# Patient Record
Sex: Female | Born: 1945 | Race: Black or African American | Hispanic: No | Smoking: Former smoker
Health system: Southern US, Community
[De-identification: ages and names within clinical notes are randomized; demographics above are authoritative.]

## PROBLEM LIST (undated history)

## (undated) DIAGNOSIS — Z95828 Presence of other vascular implants and grafts: Secondary | ICD-10-CM

## (undated) DIAGNOSIS — D689 Coagulation defect, unspecified: Secondary | ICD-10-CM

## (undated) DIAGNOSIS — T7840XA Allergy, unspecified, initial encounter: Secondary | ICD-10-CM

## (undated) DIAGNOSIS — I82409 Acute embolism and thrombosis of unspecified deep veins of unspecified lower extremity: Secondary | ICD-10-CM

## (undated) DIAGNOSIS — D259 Leiomyoma of uterus, unspecified: Secondary | ICD-10-CM

## (undated) DIAGNOSIS — H40059 Ocular hypertension, unspecified eye: Secondary | ICD-10-CM

## (undated) DIAGNOSIS — K219 Gastro-esophageal reflux disease without esophagitis: Secondary | ICD-10-CM

## (undated) DIAGNOSIS — H269 Unspecified cataract: Secondary | ICD-10-CM

## (undated) DIAGNOSIS — M5136 Other intervertebral disc degeneration, lumbar region: Secondary | ICD-10-CM

## (undated) DIAGNOSIS — I10 Essential (primary) hypertension: Secondary | ICD-10-CM

## (undated) DIAGNOSIS — M51369 Other intervertebral disc degeneration, lumbar region without mention of lumbar back pain or lower extremity pain: Secondary | ICD-10-CM

## (undated) DIAGNOSIS — E785 Hyperlipidemia, unspecified: Secondary | ICD-10-CM

## (undated) DIAGNOSIS — R87629 Unspecified abnormal cytological findings in specimens from vagina: Secondary | ICD-10-CM

## (undated) DIAGNOSIS — F32A Depression, unspecified: Secondary | ICD-10-CM

## (undated) DIAGNOSIS — F329 Major depressive disorder, single episode, unspecified: Secondary | ICD-10-CM

## (undated) HISTORY — DX: Major depressive disorder, single episode, unspecified: F32.9

## (undated) HISTORY — DX: Depression, unspecified: F32.A

## (undated) HISTORY — PX: COLPOSCOPY: SHX161

## (undated) HISTORY — DX: Hyperlipidemia, unspecified: E78.5

## (undated) HISTORY — DX: Gastro-esophageal reflux disease without esophagitis: K21.9

## (undated) HISTORY — DX: Acute embolism and thrombosis of unspecified deep veins of unspecified lower extremity: I82.409

## (undated) HISTORY — DX: Leiomyoma of uterus, unspecified: D25.9

## (undated) HISTORY — DX: Unspecified abnormal cytological findings in specimens from vagina: R87.629

## (undated) HISTORY — DX: Unspecified cataract: H26.9

## (undated) HISTORY — DX: Presence of other vascular implants and grafts: Z95.828

## (undated) HISTORY — DX: Allergy, unspecified, initial encounter: T78.40XA

## (undated) HISTORY — DX: Coagulation defect, unspecified: D68.9

## (undated) HISTORY — PX: CERVICAL BIOPSY: SHX590

## (undated) HISTORY — DX: Essential (primary) hypertension: I10

## (undated) HISTORY — DX: Ocular hypertension, unspecified eye: H40.059

---

## 2011-01-17 HISTORY — PX: INSERTION OF VENA CAVA FILTER: SHX5871

## 2012-09-21 ENCOUNTER — Other Ambulatory Visit: Payer: Self-pay | Admitting: *Deleted

## 2012-09-21 DIAGNOSIS — Z780 Asymptomatic menopausal state: Secondary | ICD-10-CM

## 2012-10-07 ENCOUNTER — Telehealth: Payer: Self-pay | Admitting: Family Medicine

## 2012-10-07 NOTE — Telephone Encounter (Signed)
APPT RESCHEDULED

## 2012-10-10 ENCOUNTER — Ambulatory Visit: Payer: Self-pay | Admitting: Family Medicine

## 2012-10-17 ENCOUNTER — Ambulatory Visit (INDEPENDENT_AMBULATORY_CARE_PROVIDER_SITE_OTHER): Payer: Medicare Other | Admitting: Family Medicine

## 2012-10-17 ENCOUNTER — Encounter: Payer: Self-pay | Admitting: Family Medicine

## 2012-10-17 VITALS — BP 138/79 | HR 96 | Temp 99.1°F | Ht 67.0 in | Wt 206.2 lb

## 2012-10-17 DIAGNOSIS — E785 Hyperlipidemia, unspecified: Secondary | ICD-10-CM

## 2012-10-17 DIAGNOSIS — I10 Essential (primary) hypertension: Secondary | ICD-10-CM | POA: Insufficient documentation

## 2012-10-17 NOTE — Progress Notes (Signed)
Patient ID: Renee Pratt, female   DOB: 10-29-1945, 67 y.o.   MRN: [id number] SUBJECTIVE:   HPI: Patient is here for follow up of hyperlipidemia: deniesHeadache;deniesChest Pain;deniesweakness;deniesShortness of Breath and orthopnea;deniesVisual changes;deniespalpitations;deniescough;deniespedal edema;deniessymptoms of TIA or stroke;deniesClaudication symptoms. admits toCompliance with medications; deniesProblems with medications.   PMH/PSH: reviewed/updated in Epic  SH/FH: reviewed/updated in Epic  Allergies: reviewed/updated in Epic  Medications: reviewed/updated in Epic  Immunizations: reviewed/updated in Epic  ROS: As above in the HPI. All other systems are stable or negative.  OBJECTIVE:    On examination she appeared in good health and spirits. Vital signs as documented. BP 138/79  Pulse 96  Temp(Src) 99.1 F (37.3 C) (Oral)  Ht 5\' 7"  (1.702 m)  Wt 206 lb 3.2 oz (93.532 kg)  BMI 32.29 kg/m2  SpO2 99% Obese Skin warm and dry and without overt rashes.  Head, Eyes, Ears, throat: normal Neck without JVD.  Lungs clear.  Heart exam notable for regular rhythm, normal sounds and absence of murmurs, rubs or gallops. Abdomen unremarkable and without evidence of organomegaly, masses, or abdominal aortic enlargement.  Extremities nonedematous. Neurologic: nonfocal  ASSESSMENT:  Unspecified essential hypertension  Other and unspecified hyperlipidemia  Obesity  PLAN:  No orders of the defined types were placed in this encounter.   No results found for this or any previous visit (from the past 24 hour(s)). Meds ordered this encounter  Medications  . atorvastatin (LIPITOR) 20 MG tablet    Sig:   . hydrochlorothiazide (HYDRODIURIL) 25 MG tablet    Sig:   . losartan (COZAAR) 100 MG tablet    Sig:   . NIFEdipine (PROCARDIA XL/ADALAT-CC) 60 MG 24 hr tablet    Sig:    reviewed meds. Refill is appropriate. Blood pressure is much better. Diet and exercise discussed.  Return to clinic in 2 months. for fasting labs.  Renee Pratt, M.D.

## 2012-12-18 ENCOUNTER — Ambulatory Visit (INDEPENDENT_AMBULATORY_CARE_PROVIDER_SITE_OTHER): Payer: Medicare Other | Admitting: Pharmacist

## 2012-12-18 ENCOUNTER — Ambulatory Visit (INDEPENDENT_AMBULATORY_CARE_PROVIDER_SITE_OTHER): Payer: Medicare Other

## 2012-12-18 VITALS — Ht 66.0 in | Wt 204.0 lb

## 2012-12-18 DIAGNOSIS — Z780 Asymptomatic menopausal state: Secondary | ICD-10-CM

## 2012-12-18 DIAGNOSIS — Z13820 Encounter for screening for osteoporosis: Secondary | ICD-10-CM

## 2012-12-18 NOTE — Progress Notes (Signed)
Patient ID: Renee Pratt, female   DOB: 03/08/1946, 67 y.o.   MRN: [id number] [facility] Current Height: Height: 5\' 6"  (167.6 cm)      Max Lifetime Height:  5\' 6"  Current Weight: Weight: 204 lb (92.534 kg)       Ethnicity:African American    HPI: Does pt already have a diagnosis of:  Osteopenia?  No Osteoporosis?  No  Back Pain?  No       Kyphosis?  No Prior fracture?  No Med(s) for Osteoporosis/Osteopenia:  none Med(s) previously tried for Osteoporosis/Osteopenia:  none                                                             PMH: Age at menopause:  59's Hysterectomy?  No Oophorectomy?  No HRT? No Steroid Use?  No Thyroid med?  No History of cancer?  No History of digestive disorders (ie Crohn's)?  No Current or previous eating disorders?  No Last Vitamin D Result:  needed Last GFR Result:  needed   FH/SH: Family history of osteoporosis?  No Parent with history of hip fracture?  No Family history of breast cancer?  No Exercise?  Yes - silver sneakers and zumba (4 times weekly) Smoking?  No Alcohol?  No    Calcium Assessment Calcium Intake  # of servings/day  Calcium mg  Milk (8 oz) 0  x  300  = 0  Yogurt (4 oz) 1 x  200 = 200  Cheese (1 oz) 1 x  200 = 1  Other Calcium sources   250mg   Ca supplement mvi = 400mg    Estimated calcium intake per day 850mg     DEXA Results Date of Test T-Score for AP Spine L1-L4 T-Score for Total Left Hip T-Score for Total Right Hip  06/112/014 2.0 1.8 2.1                    Assessment: Normal BMD   Recommendations: 1.  Recommended increase calcium 1200mg  daily through supplementation or diet.  2.  continue weight bearing exercise - 30 minutes at least 4 days  per week.   3.  Counseled and educated about fall risk and prevention.  Recheck DEXA:  2-3 years  Time spent counseling patient:  15 minutes

## 2012-12-19 ENCOUNTER — Other Ambulatory Visit: Payer: Self-pay | Admitting: Family Medicine

## 2012-12-19 ENCOUNTER — Other Ambulatory Visit (INDEPENDENT_AMBULATORY_CARE_PROVIDER_SITE_OTHER): Payer: Medicare Other

## 2012-12-19 DIAGNOSIS — E785 Hyperlipidemia, unspecified: Secondary | ICD-10-CM

## 2012-12-19 DIAGNOSIS — E559 Vitamin D deficiency, unspecified: Secondary | ICD-10-CM

## 2012-12-19 DIAGNOSIS — I10 Essential (primary) hypertension: Secondary | ICD-10-CM

## 2012-12-19 LAB — COMPLETE METABOLIC PANEL WITH GFR
ALT: 10 U/L (ref 0–35)
AST: 13 U/L (ref 0–37)
Albumin: 4.1 g/dL (ref 3.5–5.2)
Alkaline Phosphatase: 70 U/L (ref 39–117)
BUN: 10 mg/dL (ref 6–23)
CO2: 30 mEq/L (ref 19–32)
Calcium: 9.3 mg/dL (ref 8.4–10.5)
Chloride: 104 mEq/L (ref 96–112)
Creat: 0.85 mg/dL (ref 0.50–1.10)
GFR, Est African American: 83 mL/min
GFR, Est Non African American: 72 mL/min
Glucose, Bld: 95 mg/dL (ref 70–99)
Potassium: 3.7 mEq/L (ref 3.5–5.3)
Sodium: 142 mEq/L (ref 135–145)
Total Bilirubin: 0.4 mg/dL (ref 0.3–1.2)
Total Protein: 7.1 g/dL (ref 6.0–8.3)

## 2012-12-20 ENCOUNTER — Other Ambulatory Visit: Payer: Self-pay | Admitting: Family Medicine

## 2012-12-20 DIAGNOSIS — E785 Hyperlipidemia, unspecified: Secondary | ICD-10-CM

## 2012-12-20 LAB — NMR LIPOPROFILE WITH LIPIDS
Cholesterol, Total: 236 mg/dL — ABNORMAL HIGH (ref <200)
HDL Particle Number: 32.8 umol/L (ref >=30.5)
HDL Size: 8.5 nm — ABNORMAL LOW (ref >=9.2)
HDL-C: 48 mg/dL (ref >=40)
LDL (calc): 166 mg/dL — ABNORMAL HIGH (ref <100)
LDL Particle Number: 2608 nmol/L — ABNORMAL HIGH (ref <1000)
LDL Size: 20.9 nm (ref >20.5)
LP-IR Score: 84 — ABNORMAL HIGH (ref <=45)
Large HDL-P: 3 umol/L — ABNORMAL LOW (ref >=4.8)
Large VLDL-P: 3.2 nmol/L — ABNORMAL HIGH (ref <=2.7)
Small LDL Particle Number: 1437 nmol/L — ABNORMAL HIGH (ref <=527)
Triglycerides: 111 mg/dL (ref <150)
VLDL Size: 59.1 nm — ABNORMAL HIGH (ref <=46.6)

## 2012-12-20 LAB — VITAMIN D 25 HYDROXY (VIT D DEFICIENCY, FRACTURES): Vit D, 25-Hydroxy: 46 ng/mL (ref 30–89)

## 2012-12-20 MED ORDER — ATORVASTATIN CALCIUM 20 MG PO TABS: 20.0000 mg | ORAL_TABLET | Freq: Every day | ORAL | Status: DC

## 2012-12-24 ENCOUNTER — Encounter: Payer: Self-pay | Admitting: Family Medicine

## 2012-12-24 ENCOUNTER — Ambulatory Visit (INDEPENDENT_AMBULATORY_CARE_PROVIDER_SITE_OTHER): Payer: Medicare Other | Admitting: Family Medicine

## 2012-12-24 VITALS — BP 137/81 | HR 84 | Temp 97.8°F | Wt 208.4 lb

## 2012-12-24 DIAGNOSIS — E559 Vitamin D deficiency, unspecified: Secondary | ICD-10-CM | POA: Insufficient documentation

## 2012-12-24 DIAGNOSIS — E669 Obesity, unspecified: Secondary | ICD-10-CM | POA: Insufficient documentation

## 2012-12-24 DIAGNOSIS — R635 Abnormal weight gain: Secondary | ICD-10-CM

## 2012-12-24 DIAGNOSIS — E785 Hyperlipidemia, unspecified: Secondary | ICD-10-CM

## 2012-12-24 DIAGNOSIS — I10 Essential (primary) hypertension: Secondary | ICD-10-CM

## 2012-12-24 NOTE — Patient Instructions (Addendum)
      Dr Phares Zaccone's Recommendations  Diet and Exercise discussed with patient.  For nutrition information, I recommend books:  1).Eat to Live by Dr Joel Fuhrman. 2).Prevent and Reverse Heart Disease by Dr Caldwell Esselstyn. 3) Dr Neal Barnard's Book:  Program to Reverse Diabetes  Exercise recommendations are:  If unable to walk, then the patient can exercise in a chair 3 times a day. By flapping arms like a bird gently and raising legs outwards to the front.  If ambulatory, the patient can go for walks for 30 minutes 3 times a week. Then increase the intensity and duration as tolerated.  Goal is to try to attain exercise frequency to 5 times a week.  If applicable: Best to perform resistance exercises (machines or weights) 2 days a week and cardio type exercises 3 days per week.  

## 2012-12-24 NOTE — Progress Notes (Signed)
Patient ID: Renee Pratt, female   DOB: October 28, 1945, 67 y.o.   MRN: [id number] SUBJECTIVE: CC: Chief Complaint  Patient presents with  . Hypertension    reck bp needs appt   needs refills .was on torsemide does she cont this med as needed    HPI:  Breakfast: 2 toast and coffee Lunch: broccoli, chicken or crackers with tuna. Dinner depends if she eats lunch.  Patient is here for follow up of hyperlipidemia:  denies Headache;denies Chest Pain;denies weakness;denies Shortness of Breath and orthopnea;denies Visual changes;denies palpitations;denies cough;denies pedal edema;denies symptoms of TIA or stroke;deniesClaudication symptoms. admits to Compliance with medications; denies Problems with medications. Patient is here for follow up of hypertension:  denies Headache;deniesChest Pain;denies weakness;denies Shortness of Breath or Orthopnea;denies Visual changes;denies palpitations;denies cough;denies pedal edema;denies symptoms of TIA or stroke; admits to Compliance with medications. denies Problems with medications.  Also has Vit D deficiency   PMH/PSH: reviewed/updated in Epic  SH/FH: reviewed/updated in Epic  Allergies: reviewed/updated in Epic  Medications: reviewed/updated in Epic  Immunizations: reviewed/updated in Epic  ROS: As above in the HPI. All other systems are stable or negative.  OBJECTIVE: APPEARANCE:  Patient in no acute distress.The patient appeared well nourished and normally developed. Acyanotic. Waist: VITAL SIGNS:BP 137/81  Pulse 84  Temp(Src) 97.8 F (36.6 C) (Oral)  Wt 208 lb 6.4 oz (94.53 kg)  BMI 33.65 kg/m2 WF obese  SKIN: warm and  Dry without overt rashes, tattoos and scars  HEAD and Neck: without JVD, Head and scalp: normal Eyes:No scleral icterus. Fundi normal, eye movements normal. Ears: Auricle normal, canal normal, Tympanic membranes normal, insufflation normal. Nose: normal Throat: normal Neck & thyroid: normal  CHEST &  LUNGS: Chest wall: normal Lungs: Clear  CVS: Reveals the PMI to be normally located. Regular rhythm, First and Second Heart sounds are normal,  absence of murmurs, rubs or gallops. Peripheral vasculature: Radial pulses: normal Dorsal pedis pulses: normal Posterior pulses: normal  ABDOMEN:  Appearance: obeseBenign, no organomegaly, no masses, no Abdominal Aortic enlargement. No Guarding , no rebound. No Bruits. Bowel sounds: normal  RECTAL: N/A GU: N/A  EXTREMETIES: nonedematous. Both Femoral and Pedal pulses are normal.  MUSCULOSKELETAL:  Spine: normal Joints: intact  NEUROLOGIC: oriented to time,place and person; nonfocal. Strength is normal Sensory is normal Reflexes are normal Cranial Nerves are normal.  Results for orders placed in visit on 12/19/12  COMPLETE METABOLIC PANEL WITH GFR      Result Value Range   Sodium 142  135 - 145 mEq/L   Potassium 3.7  3.5 - 5.3 mEq/L   Chloride 104  96 - 112 mEq/L   CO2 30  19 - 32 mEq/L   Glucose, Bld 95  70 - 99 mg/dL   BUN 10  6 - 23 mg/dL   Creat 8.29  5.62 - 1.30 mg/dL   Total Bilirubin 0.4  0.3 - 1.2 mg/dL   Alkaline Phosphatase 70  39 - 117 U/L   AST 13  0 - 37 U/L   ALT 10  0 - 35 U/L   Total Protein 7.1  6.0 - 8.3 g/dL   Albumin 4.1  3.5 - 5.2 g/dL   Calcium 9.3  8.4 - 86.5 mg/dL   GFR, Est African American 83     GFR, Est Non African American 72    NMR LIPOPROFILE WITH LIPIDS      Result Value Range   LDL Particle Number [id number] (*) <1000 nmol/L   LDL (calc)  166 (*) <100 mg/dL   HDL-C 48  >=16 mg/dL   Triglycerides 109  <604 mg/dL   Cholesterol, Total 540 (*) <200 mg/dL   HDL Particle Number [id number]  >=19.1 umol/L   Large HDL-P 3.0 (*) >=4.8 umol/L   Large VLDL-P 3.2 (*) <=2.7 nmol/L   Small LDL Particle Number [id number] (*) <=527 nmol/L   LDL Size 20.9  >20.5 nm   HDL Size 8.5 (*) >=9.2 nm   VLDL Size 59.1 (*) <=46.6 nm   LP-IR Score 84 (*) <=45  VITAMIN D 25 HYDROXY      Result Value Range   Vit D,  25-Hydroxy 46  30 - 89 ng/mL    ASSESSMENT: Unspecified essential hypertension  Other and unspecified hyperlipidemia  Unspecified vitamin D deficiency  Obesity, unspecified    PLAN:      Dr Woodroe Mode Recommendations  Diet and Exercise discussed with patient.  For nutrition information, I recommend books:  1).Eat to Live by Dr Monico Hoar. 2).Prevent and Reverse Heart Disease by Dr Suzzette Righter. 3) Dr Katherina Right Book:  Program to Reverse Diabetes  Exercise recommendations are:  If unable to walk, then the patient can exercise in a chair 3 times a day. By flapping arms like a bird gently and raising legs outwards to the front.  If ambulatory, the patient can go for walks for 30 minutes 3 times a week. Then increase the intensity and duration as tolerated.  Goal is to try to attain exercise frequency to 5 times a week.  If applicable: Best to perform resistance exercises (machines or weights) 2 days a week and cardio type exercises 3 days per week.    Reviewed results with the patient. Discussed treatment and adjustment of the lipitor.:  Current outpatient prescriptions:atorvastatin (LIPITOR) 20 MG tablet, Take 1 tablet (20 mg total) by mouth daily., Disp: 30 tablet, Rfl: 3;  cholecalciferol (VITAMIN D) 1000 UNITS tablet, Take 2,000 Units by mouth daily., Disp: , Rfl: ;  hydrochlorothiazide (HYDRODIURIL) 25 MG tablet, Take 25 mg by mouth daily. , Disp: , Rfl: ;  losartan (COZAAR) 100 MG tablet, Take 100 mg by mouth daily. , Disp: , Rfl:  Multiple Vitamins-Minerals (CENTRUM SILVER PO), Take 1 tablet by mouth every other day., Disp: , Rfl: ;  NIFEdipine (PROCARDIA XL/ADALAT-CC) 60 MG 24 hr tablet, Take 60 mg by mouth daily. , Disp: , Rfl:    Return in about 3 months (around 03/26/2013) for Recheck medical problems.  Renee Pratt P. Modesto Charon, M.D.

## 2013-01-21 ENCOUNTER — Encounter: Payer: Self-pay | Admitting: Obstetrics & Gynecology

## 2013-01-21 ENCOUNTER — Other Ambulatory Visit (HOSPITAL_COMMUNITY)
Admission: RE | Admit: 2013-01-21 | Discharge: 2013-01-21 | Disposition: A | Discharge status: Home / Self Care | Payer: Medicare Other | Source: Ambulatory Visit | Attending: Obstetrics & Gynecology | Admitting: Obstetrics & Gynecology

## 2013-01-21 ENCOUNTER — Ambulatory Visit (INDEPENDENT_AMBULATORY_CARE_PROVIDER_SITE_OTHER): Payer: Medicare Other | Admitting: Obstetrics & Gynecology

## 2013-01-21 VITALS — BP 160/92 | HR 84 | Resp 16 | Ht 67.0 in | Wt 212.0 lb

## 2013-01-21 DIAGNOSIS — Z124 Encounter for screening for malignant neoplasm of cervix: Secondary | ICD-10-CM

## 2013-01-21 DIAGNOSIS — Z01419 Encounter for gynecological examination (general) (routine) without abnormal findings: Secondary | ICD-10-CM | POA: Insufficient documentation

## 2013-01-21 DIAGNOSIS — N760 Acute vaginitis: Secondary | ICD-10-CM | POA: Insufficient documentation

## 2013-01-21 DIAGNOSIS — Z1151 Encounter for screening for human papillomavirus (HPV): Secondary | ICD-10-CM | POA: Insufficient documentation

## 2013-01-21 DIAGNOSIS — Z139 Encounter for screening, unspecified: Secondary | ICD-10-CM

## 2013-01-21 NOTE — Progress Notes (Signed)
Patient ID: Renee Pratt, female   DOB: 1945/08/20, 67 y.o.   MRN: [id number]  Chief Complaint  Patient presents with  . Gynecologic Exam    HPI Renee Pratt is a 67 y.o. female.  No obstetric history on file. No LMP recorded. Patient is postmenopausal. States she had abnormal pap followed by 2 colposcopies in [place] 3 years ago, last pap was normal. Also was diagnosed with fibroid.   HPI  Past Medical History  Diagnosis Date  . Hypertension   . Hyperlipidemia   . Uterine fibroid   . Blood clotting tendency     Past Surgical History  Procedure Laterality Date  . Colposcopy    . Insertion of vena cava filter  01-17-2011    dr pindr # [phone]    Family History  Problem Relation Age of Onset  . Kidney disease Mother   . Cancer Father     Social History History  Substance Use Topics  . Smoking status: Former Smoker    Quit date: 10/17/1997  . Smokeless tobacco: Never Used  . Alcohol Use: Yes     Comment: Socially    No Known Allergies  Current Outpatient Prescriptions  Medication Sig Dispense Refill  . atorvastatin (LIPITOR) 20 MG tablet Take 1 tablet (20 mg total) by mouth daily.  30 tablet  3  . cholecalciferol (VITAMIN D) 1000 UNITS tablet Take 2,000 Units by mouth daily.      . hydrochlorothiazide (HYDRODIURIL) 25 MG tablet Take 25 mg by mouth daily.       Marland Kitchen losartan (COZAAR) 100 MG tablet Take 100 mg by mouth daily.       . Multiple Vitamins-Minerals (CENTRUM SILVER PO) Take 1 tablet by mouth every other day.      Marland Kitchen NIFEdipine (PROCARDIA XL/ADALAT-CC) 60 MG 24 hr tablet Take 60 mg by mouth daily.        No current facility-administered medications for this visit.    Review of Systems Review of Systems  Constitutional: Negative for fever.  Genitourinary: Positive for vaginal discharge. Negative for dysuria, vaginal bleeding and pelvic pain.  Musculoskeletal:       Leg swelling  Skin: Positive for color change (lower leg).    Blood pressure  160/92, pulse 84, resp. rate 16, height 5\' 7"  (1.702 m), weight 212 lb (96.163 kg).  Physical Exam Physical Exam  Nursing note and vitals reviewed. Constitutional: She is oriented to person, place, and time. She appears well-developed and well-nourished. No distress.  Pulmonary/Chest: Effort normal. No respiratory distress.  Breasts symmetric no mass or tenderness  Abdominal: Soft. She exhibits no mass. There is tenderness.  Genitourinary: Uterus normal. Vaginal discharge (scant) found.  No masses, minimal uterine enlargement  Musculoskeletal: She exhibits edema (ankles, c/w venous insufficiency).  Neurological: She is alert and oriented to person, place, and time.  Skin: Skin is warm and dry.  Psychiatric: She has a normal mood and affect. Her behavior is normal.    Data Reviewed Mammogram normal  Assessment    Well woman exam, no evidence of significant uterine enlargement. History of abnormal pap, need records     Plan    ROI previous pap results. If pap and HPV normal then no more paps. Breast exams and mammograms yearly       Renee Pratt 01/21/2013, 10:21 AM

## 2013-01-21 NOTE — Patient Instructions (Signed)
Uterine Fibroid A uterine fibroid is a growth (tumor) that occurs in a woman's uterus. This type of tumor is not cancerous and does not spread out of the uterus. A woman can have one or many fibroids, and the fiboid(s) can become quite large. A fibroid can vary in size, weight, and where it grows in the uterus. Most fibroids do not require medical treatment, but some can cause pain or heavy bleeding during and between periods. CAUSES  A fibroid is the result of a single uterine cell that keeps growing (unregulated), which is different than most cells in the human body. Most cells have a control mechanism that keeps them from reproducing without control.  SYMPTOMS   Bleeding.  Pelvic pain and pressure.  Bladder problems due to the size of the fibroid.  Infertility and miscarriages depending on the size and location of the fibroid. DIAGNOSIS  A diagnosis is made by physical exam. Your caregiver may feel the lumpy tumors during a pelvic exam. Important information regarding size, location, and number of tumors can be gained by having an ultrasound. It is rare that other tests, such as a CT scan or MRI, are needed. TREATMENT   Your caregiver may recommend watchful waiting. This involves getting the fibroid checked by your caregiver to see if the fibroids grow or shrink.   Hormonal treatment or an intrauterine device (IUD) may be prescribed.   Surgery may be needed to remove the fibroids (myomectomy) or the uterus (hysterectomy). This depends on your situation. When fibroids interfere with fertility and a woman wants to become pregnant, a caregiver may recommend having the fibroids removed.  HOME CARE INSTRUCTIONS  Home care depends on how you were treated. In general:   Keep all follow-up appointments with your caregiver.   Only take medicine as told by your caregiver. Do not take aspirin. It can cause bleeding.   If you have excessive periods and soak tampons or pads in a half hour or  less, contact your caregiver immediately. If your periods are troublesome but not so heavy, lie down with your feet raised slightly above your heart. Place cold packs on your lower abdomen.   If your periods are heavy, write down the number of pads or tampons you use per month. Bring this information to your caregiver.   Talk to your caregiver about taking iron pills.   Include green vegetables in your diet.   If you were prescribed a hormonal treatment, take the hormonal medicines as directed.   If you need surgery, ask your caregiver for information on your specific surgery.  SEEK IMMEDIATE MEDICAL CARE IF:  You have pelvic pain or cramps not controlled with medicines.   You have a sudden increase in pelvic pain.   You have an increase of bleeding between and during periods.   You feel lightheaded or have fainting episodes.  MAKE SURE YOU:  Understand these instructions.  Will watch your condition.  Will get help right away if you are not doing well or get worse. Document Released: 06/23/2000 Document Revised: 09/18/2011 Document Reviewed: 07/17/2011 [employer] Patient Information 2014 [employer].  

## 2013-01-24 ENCOUNTER — Telehealth: Payer: Self-pay | Admitting: Family Medicine

## 2013-01-26 NOTE — Telephone Encounter (Signed)
Office visit to recheck the legs.

## 2013-01-27 ENCOUNTER — Other Ambulatory Visit: Payer: Self-pay | Admitting: Family Medicine

## 2013-01-27 ENCOUNTER — Telehealth: Payer: Self-pay | Admitting: *Deleted

## 2013-01-27 DIAGNOSIS — I82409 Acute embolism and thrombosis of unspecified deep veins of unspecified lower extremity: Secondary | ICD-10-CM

## 2013-01-27 NOTE — Telephone Encounter (Signed)
Spoke with pt and will sch appt for dr Modesto Charon to ck legs [facility]

## 2013-01-27 NOTE — Telephone Encounter (Signed)
Message left for pt that referral was initiated and to return call if not heard in 3 days

## 2013-01-27 NOTE — Telephone Encounter (Signed)
Has had a DVT in right leg for 4 years. Pt has filter and would like referral to vascular surgeon.

## 2013-01-27 NOTE — Telephone Encounter (Signed)
Referral done

## 2013-01-29 ENCOUNTER — Other Ambulatory Visit: Payer: Self-pay | Admitting: *Deleted

## 2013-01-29 DIAGNOSIS — M79609 Pain in unspecified limb: Secondary | ICD-10-CM

## 2013-01-29 DIAGNOSIS — M7989 Other specified soft tissue disorders: Secondary | ICD-10-CM

## 2013-02-05 ENCOUNTER — Telehealth: Payer: Self-pay | Admitting: Family Medicine

## 2013-02-05 MED ORDER — HYDROCHLOROTHIAZIDE 25 MG PO TABS: 25.0000 mg | ORAL_TABLET | Freq: Every day | ORAL | Status: DC

## 2013-02-05 NOTE — Telephone Encounter (Signed)
Med sent to [facility]- pt aware

## 2013-02-24 ENCOUNTER — Telehealth: Payer: Self-pay | Admitting: Family Medicine

## 2013-02-24 NOTE — Telephone Encounter (Signed)
Does she want to go back on Diovan?

## 2013-02-24 NOTE — Telephone Encounter (Signed)
Went to [place] last week and saw her Doctor there on 8/13, BP was 140/90, the next day at pharmacy it was 150/86, then the 15th, it was 145/90, on th 16th, it was 160/80.  Today the [facility] nurse came and it was 160/90.  The blood pressure medicine is not working.  She was on Diovan 180 in the past and it was changed to Losartan.  It is not getting her to her goal.  Patient has been splitting Losartan 100 in 1/2 and taking 1/2 in am and 1/2 in pm because it wasn't keeping it down all day.  She just tried this on her own.  It wasn't working when she took the whole one at one time.

## 2013-02-25 ENCOUNTER — Other Ambulatory Visit: Payer: Self-pay | Admitting: Family Medicine

## 2013-02-25 DIAGNOSIS — I10 Essential (primary) hypertension: Secondary | ICD-10-CM

## 2013-02-25 MED ORDER — VALSARTAN 160 MG PO TABS: 160.0000 mg | ORAL_TABLET | Freq: Every day | ORAL | Status: DC

## 2013-02-25 NOTE — Telephone Encounter (Signed)
Pt states was on diovan 180 mg --med changed to losartan 100mg  and she does not think it is holding her BP like it should  [facility]  For rx  Pt aware to ck with [facility] this evening

## 2013-02-25 NOTE — Telephone Encounter (Signed)
Returned patient's call.  Anxious about the switch in BP meds and her BP running 140s  Systolic then yesterday and today 160 systolic. Will switch her back to diovan 180 mg daily. Ordered in [employer] for [employer].

## 2013-02-26 ENCOUNTER — Encounter: Payer: Self-pay | Admitting: Vascular Surgery

## 2013-02-27 ENCOUNTER — Encounter: Payer: Medicare Other | Admitting: Vascular Surgery

## 2013-03-04 ENCOUNTER — Other Ambulatory Visit: Payer: Self-pay | Admitting: Family Medicine

## 2013-03-27 ENCOUNTER — Ambulatory Visit (INDEPENDENT_AMBULATORY_CARE_PROVIDER_SITE_OTHER): Payer: Medicare Other | Admitting: Family Medicine

## 2013-03-27 ENCOUNTER — Encounter: Payer: Self-pay | Admitting: Family Medicine

## 2013-03-27 VITALS — BP 154/87 | HR 97 | Temp 98.0°F | Ht 66.0 in | Wt 211.0 lb

## 2013-03-27 DIAGNOSIS — D259 Leiomyoma of uterus, unspecified: Secondary | ICD-10-CM | POA: Insufficient documentation

## 2013-03-27 DIAGNOSIS — I10 Essential (primary) hypertension: Secondary | ICD-10-CM | POA: Insufficient documentation

## 2013-03-27 DIAGNOSIS — Z95828 Presence of other vascular implants and grafts: Secondary | ICD-10-CM | POA: Insufficient documentation

## 2013-03-27 DIAGNOSIS — E669 Obesity, unspecified: Secondary | ICD-10-CM

## 2013-03-27 DIAGNOSIS — E785 Hyperlipidemia, unspecified: Secondary | ICD-10-CM | POA: Insufficient documentation

## 2013-03-27 DIAGNOSIS — E559 Vitamin D deficiency, unspecified: Secondary | ICD-10-CM

## 2013-03-27 DIAGNOSIS — I82409 Acute embolism and thrombosis of unspecified deep veins of unspecified lower extremity: Secondary | ICD-10-CM | POA: Insufficient documentation

## 2013-03-27 DIAGNOSIS — R635 Abnormal weight gain: Secondary | ICD-10-CM

## 2013-03-27 DIAGNOSIS — D689 Coagulation defect, unspecified: Secondary | ICD-10-CM | POA: Insufficient documentation

## 2013-03-27 MED ORDER — TERAZOSIN HCL 5 MG PO CAPS: 5.0000 mg | ORAL_CAPSULE | Freq: Every day | ORAL | Status: DC

## 2013-03-27 MED ORDER — HYDROCHLOROTHIAZIDE 25 MG PO TABS: 25.0000 mg | ORAL_TABLET | Freq: Every day | ORAL | Status: DC

## 2013-03-27 MED ORDER — LOSARTAN POTASSIUM 100 MG PO TABS: ORAL_TABLET | ORAL | Status: DC

## 2013-03-27 MED ORDER — NIFEDIPINE ER OSMOTIC RELEASE 60 MG PO TB24: 60.0000 mg | ORAL_TABLET | Freq: Every day | ORAL | Status: DC

## 2013-03-27 NOTE — Progress Notes (Signed)
Patient ID: Renee Pratt, female   DOB: 18-May-1946, 67 y.o.   MRN: [id number] SUBJECTIVE: CC: Chief Complaint  Patient presents with  . Follow-up    3 month non fasting  wants "patch "for sea sickness going on a cruise     HPI: Patient is here for follow up of hyperlipidemia/HTN/DVT/ hypercoagable state: denies Headache;denies Chest Pain;denies weakness;denies Shortness of Breath and orthopnea;denies Visual changes;denies palpitations;denies cough;denies pedal edema;denies symptoms of TIA or stroke;deniesClaudication symptoms. admits to Compliance with medications; denies Problems with medications.   Past Medical History  Diagnosis Date  . Hypertension   . Hyperlipidemia   . Uterine fibroid   . Blood clotting tendency    Past Surgical History  Procedure Laterality Date  . Colposcopy    . Insertion of vena cava filter  01-17-2011    dr pindr # [id number]   History   Social History  . Marital Status: Divorced    Spouse Name: N/A    Number of Children: N/A  . Years of Education: N/A   Occupational History  . Not on file.   Social History Main Topics  . Smoking status: Former Smoker    Quit date: 10/17/1997  . Smokeless tobacco: Never Used  . Alcohol Use: Yes     Comment: Socially  . Drug Use: No  . Sexual Activity: No   Other Topics Concern  . Not on file   Social History Narrative  . No narrative on file   Family History  Problem Relation Age of Onset  . Kidney disease Mother   . Cancer Father    Current Outpatient Prescriptions on File Prior to Visit  Medication Sig Dispense Refill  . atorvastatin (LIPITOR) 20 MG tablet Take 1 tablet (20 mg total) by mouth daily.  30 tablet  3  . cholecalciferol (VITAMIN D) 1000 UNITS tablet Take 2,000 Units by mouth daily.      . hydrochlorothiazide (HYDRODIURIL) 25 MG tablet Take 1 tablet (25 mg total) by mouth daily.  30 tablet  3  . losartan (COZAAR) 100 MG tablet TAKE ONE TABLET BY MOUTH ONCE DAILY  90 tablet  0   . Multiple Vitamins-Minerals (CENTRUM SILVER PO) Take 1 tablet by mouth every other day.      Marland Kitchen NIFEdipine (PROCARDIA XL/ADALAT-CC) 60 MG 24 hr tablet Take 60 mg by mouth daily.       . valsartan (DIOVAN) 160 MG tablet Take 1 tablet (160 mg total) by mouth daily.  30 tablet  5   No current facility-administered medications on file prior to visit.   No Known Allergies Immunization History  Administered Date(s) Administered  . Pneumococcal Polysaccharide 09/11/2012  . Tdap 07/10/2002  . Zoster 07/11/2007   Prior to Admission medications   Medication Sig Start Date End Date Taking? Authorizing Provider  atorvastatin (LIPITOR) 20 MG tablet Take 1 tablet (20 mg total) by mouth daily. 12/20/12   Ileana Ladd, MD  cholecalciferol (VITAMIN D) 1000 UNITS tablet Take 2,000 Units by mouth daily.    Historical Provider, MD  hydrochlorothiazide (HYDRODIURIL) 25 MG tablet Take 1 tablet (25 mg total) by mouth daily. 02/05/13   Ileana Ladd, MD  losartan (COZAAR) 100 MG tablet TAKE ONE TABLET BY MOUTH ONCE DAILY 02/25/13   Ernestina Penna, MD  Multiple Vitamins-Minerals (CENTRUM SILVER PO) Take 1 tablet by mouth every other day.    Historical Provider, MD  NIFEdipine (PROCARDIA XL/ADALAT-CC) 60 MG 24 hr tablet Take 60 mg by mouth daily.  09/17/12   Historical Provider, MD  valsartan (DIOVAN) 160 MG tablet Take 1 tablet (160 mg total) by mouth daily. 02/25/13   Ileana Ladd, MD    ROS: As above in the HPI. All other systems are stable or negative.  OBJECTIVE: APPEARANCE:  Patient in no acute distress.The patient appeared well nourished and normally developed. Acyanotic. Waist: VITAL SIGNS:BP 154/87  Pulse 97  Temp(Src) 98 F (36.7 C) (Oral)  Ht 5\' 6"  (1.676 m)  Wt 211 lb (95.709 kg)  BMI 34.07 kg/m2  AAF obese  SKIN: warm and  Dry without overt rashes, tattoos and scars  HEAD and Neck: without JVD, Head and scalp: normal Eyes:No scleral icterus. Fundi normal, eye movements  normal. Ears: Auricle normal, canal normal, Tympanic membranes normal, insufflation normal. Nose: normal Throat: normal Neck & thyroid: normal  CHEST & LUNGS: Chest wall: normal Lungs: Clear  CVS: Reveals the PMI to be normally located. Regular rhythm, First and Second Heart sounds are normal,  absence of murmurs, rubs or gallops. Peripheral vasculature: Radial pulses: normal Dorsal pedis pulses: normal Posterior pulses: normal  ABDOMEN:  Appearance: obese Benign, no organomegaly, no masses, no Abdominal Aortic enlargement. No Guarding , no rebound. No Bruits. Bowel sounds: normal  RECTAL: N/A GU: N/A  EXTREMETIES: nonedematous.  MUSCULOSKELETAL:  Spine: normal Joints: intact  NEUROLOGIC: oriented to time,place and person; nonfocal. Strength is normal Sensory is normal Reflexes are normal Cranial Nerves are normal.  ASSESSMENT: Other and unspecified hyperlipidemia  Unspecified essential hypertension - Plan: terazosin (HYTRIN) 5 MG capsule, CMP14+EGFR  Obesity, unspecified  Unspecified vitamin D deficiency - Plan: Vit D  25 hydroxy (rtn osteoporosis monitoring)  HLD (hyperlipidemia) - Plan: NMR, lipoprofile   PLAN: Orders Placed This Encounter  Procedures  . CMP14+EGFR  . NMR, lipoprofile  . Vit D  25 hydroxy (rtn osteoporosis monitoring)   Meds ordered this encounter  Medications  . atorvastatin (LIPITOR) 20 MG tablet    Sig: Take 40 mg by mouth daily.  Marland Kitchen terazosin (HYTRIN) 5 MG capsule    Sig: Take 1 capsule (5 mg total) by mouth at bedtime.    Dispense:  30 capsule    Refill:  3  . hydrochlorothiazide (HYDRODIURIL) 25 MG tablet    Sig: Take 1 tablet (25 mg total) by mouth daily.    Dispense:  30 tablet    Refill:  3  . NIFEdipine (PROCARDIA XL/ADALAT-CC) 60 MG 24 hr tablet    Sig: Take 1 tablet (60 mg total) by mouth daily.    Dispense:  90 tablet    Refill:  3  . losartan (COZAAR) 100 MG tablet    Sig: TAKE ONE TABLET BY MOUTH ONCE DAILY     Dispense:  90 tablet    Refill:  3   Discussed goals for BP and lipids. healthy lifestyle.  Return in about 6 weeks (around 05/08/2013) for recheck BP. Will Rx the transderm scopolamine at that visit.  Almer Bushey P. [place] Charon, M.D.

## 2013-03-28 ENCOUNTER — Encounter: Payer: Self-pay | Admitting: Surgery

## 2013-03-28 LAB — CMP14+EGFR
ALT: 10 IU/L (ref 0–32)
AST: 13 IU/L (ref 0–40)
Albumin/Globulin Ratio: 1.7 (ref 1.1–2.5)
Albumin: 4.5 g/dL (ref 3.6–4.8)
Alkaline Phosphatase: 76 IU/L (ref 39–117)
BUN/Creatinine Ratio: 10 — ABNORMAL LOW (ref 11–26)
BUN: 8 mg/dL (ref 8–27)
CO2: 28 mmol/L (ref 18–29)
Calcium: 9.6 mg/dL (ref 8.6–10.2)
Chloride: 96 mmol/L — ABNORMAL LOW (ref 97–108)
Creatinine, Ser: 0.81 mg/dL (ref 0.57–1.00)
GFR calc Af Amer: 88 mL/min/{1.73_m2} (ref >59)
GFR calc non Af Amer: 76 mL/min/{1.73_m2} (ref >59)
Globulin, Total: 2.6 g/dL (ref 1.5–4.5)
Glucose: 89 mg/dL (ref 65–99)
Potassium: 3.5 mmol/L (ref 3.5–5.2)
Sodium: 141 mmol/L (ref 134–144)
Total Bilirubin: 0.3 mg/dL (ref 0.0–1.2)
Total Protein: 7.1 g/dL (ref 6.0–8.5)

## 2013-03-28 LAB — NMR, LIPOPROFILE
Cholesterol: 263 mg/dL — ABNORMAL HIGH (ref <200)
HDL Cholesterol by NMR: 49 mg/dL (ref >=40)
HDL Particle Number: 34 umol/L (ref >=30.5)
LDL Particle Number: 2958 nmol/L — ABNORMAL HIGH (ref <1000)
LDL Size: 20.9 nm (ref >20.5)
LDLC SERPL CALC-MCNC: 170 mg/dL — ABNORMAL HIGH (ref <100)
LP-IR Score: 90 — ABNORMAL HIGH (ref <=45)
Small LDL Particle Number: 1640 nmol/L — ABNORMAL HIGH (ref <=527)
Triglycerides by NMR: 220 mg/dL — ABNORMAL HIGH (ref <150)

## 2013-03-28 LAB — VITAMIN D 25 HYDROXY (VIT D DEFICIENCY, FRACTURES): Vit D, 25-Hydroxy: 21.4 ng/mL — ABNORMAL LOW (ref 30.0–100.0)

## 2013-03-31 ENCOUNTER — Ambulatory Visit (INDEPENDENT_AMBULATORY_CARE_PROVIDER_SITE_OTHER): Payer: Medicare Other | Admitting: Surgery

## 2013-03-31 ENCOUNTER — Other Ambulatory Visit: Payer: Self-pay | Admitting: Family Medicine

## 2013-03-31 ENCOUNTER — Encounter: Payer: Self-pay | Admitting: Surgery

## 2013-03-31 ENCOUNTER — Encounter (INDEPENDENT_AMBULATORY_CARE_PROVIDER_SITE_OTHER): Payer: Medicare Other | Admitting: Vascular Surgery

## 2013-03-31 VITALS — BP 151/78 | HR 82 | Ht 66.0 in | Wt 208.9 lb

## 2013-03-31 DIAGNOSIS — Z86718 Personal history of other venous thrombosis and embolism: Secondary | ICD-10-CM

## 2013-03-31 DIAGNOSIS — M79609 Pain in unspecified limb: Secondary | ICD-10-CM

## 2013-03-31 DIAGNOSIS — I82409 Acute embolism and thrombosis of unspecified deep veins of unspecified lower extremity: Secondary | ICD-10-CM

## 2013-03-31 DIAGNOSIS — M7989 Other specified soft tissue disorders: Secondary | ICD-10-CM

## 2013-03-31 MED ORDER — APIXABAN 5 MG PO TABS: 10.0000 mg | ORAL_TABLET | Freq: Two times a day (BID) | ORAL | Status: DC

## 2013-03-31 NOTE — Progress Notes (Signed)
[facility]   Patient name: Renee Pratt MRN: [id number] DOB: 1946-06-10 Sex: female   Referred by: Dr. Modesto Charon  Reason for referral:  Chief Complaint  Patient presents with  . Re-evaluation    hx of DVT R leg with filter placement 4 years ago    HISTORY OF PRESENT ILLNESS: The patient is here to get established with a vascular specialist. She has a history of 2 deep vein thrombosis in the right leg. She has a IVC filter placed approximately 4 years ago. She has been on and off Coumadin. She is not on anticoagulation at this time. She is noted for the past several days discoloration and tenderness in her right ankle and calf area. She is concerned that she has another blood clot. The patient has never been diagnosed with a specific underlying etiology for her blood clots. She does work compression stockings to help with her swelling.   The patient is on nystatin for her hyperlipidemia. She is also medically managed for hypertension. She is a former smoker. Past Medical History  Diagnosis Date  . Uterine fibroid   . Blood clotting tendency   . DVT, lower extremity, recurrent   . 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 Hyperlipidemia   . Hypertension     Past Surgical History  Procedure Laterality Date  . Colposcopy    . Insertion of vena cava filter  01-17-2011    dr pindr # [id number]    History   Social History  . Marital Status: Divorced    Spouse Name: N/A    Number of Children: N/A  . Years of Education: N/A   Occupational History  . Not on file.   Social History Main Topics  . Smoking status: Former Smoker    Quit date: 10/17/1997  . Smokeless tobacco: Never Used  . Alcohol Use: Yes     Comment: Socially  . Drug Use: No  . Sexual Activity: No   Other Topics Concern  . Not on file   Social History Narrative  . No narrative on file    Family History  Problem Relation Age of Onset  . Kidney disease Mother   . Hypertension  Mother   . Cancer Father     Allergies as of 03/31/2013  . (No Known Allergies)    Current Outpatient Prescriptions on File Prior to Visit  Medication Sig Dispense Refill  . atorvastatin (LIPITOR) 20 MG tablet Take 40 mg by mouth daily.      . cholecalciferol (VITAMIN D) 1000 UNITS tablet Take 2,000 Units by mouth daily.      . hydrochlorothiazide (HYDRODIURIL) 25 MG tablet Take 1 tablet (25 mg total) by mouth daily.  30 tablet  3  . losartan (COZAAR) 100 MG tablet TAKE ONE TABLET BY MOUTH ONCE DAILY  90 tablet  3  . Multiple Vitamins-Minerals (CENTRUM SILVER PO) Take 1 tablet by mouth every other day.      Marland Kitchen NIFEdipine (PROCARDIA XL/ADALAT-CC) 60 MG 24 hr tablet Take 1 tablet (60 mg total) by mouth daily.  90 tablet  3  . terazosin (HYTRIN) 5 MG capsule Take 1 capsule (5 mg total) by mouth at bedtime.  30 capsule  3   No current facility-administered medications on file prior to visit.     REVIEW OF SYSTEMS: Cardiovascular: No chest pain, chest pressure, palpitations, orthopnea, or dyspnea on exertion. No claudication or rest pain, positive history of DVT  Pulmonary: No productive cough,  asthma or wheezing. Neurologic: No weakness, paresthesias, aphasia, or amaurosis. No dizziness. Hematologic: No bleeding problems or clotting disorders. Musculoskeletal: No joint pain or joint swelling. Gastrointestinal: No blood in stool or hematemesis Genitourinary: No dysuria or hematuria. Psychiatric:: No history of major depression. Integumentary: No rashes or ulcers. Constitutional: No fever or chills.  PHYSICAL EXAMINATION: General: The patient appears their stated age.  Vital signs are BP 151/78  Pulse 82  Ht 5\' 6"  (1.676 m)  Wt 208 lb 14.4 oz (94.756 kg)  BMI 33.73 kg/m2  SpO2 100% HEENT:  No gross abnormalities Pulmonary: Respirations are non-labored Abdomen: Soft and non-tender  Musculoskeletal: There are no major deformities.   Neurologic: No focal weakness or paresthesias  are detected, Skin: There are no ulcer or rashes noted. Psychiatric: The patient has normal affect. Cardiovascular: There is a regular rate and rhythm without significant murmur appreciated. Palpable pedal pulses  Diagnostic Studies: Duplex ultrasound was performed today. This shows a chronic nonocclusive DVT in the mid distal thigh. There is an acute DVT in a peroneal vein   Assessment:  Chronic lower extremity DVT Acute right lower extremity DVT Plan: I discussed the ultrasound findings with the patient. I do believe that her symptoms are related to her new DVT. For that reason I feel this needs to be anticoagulated. I discussed putting her on Ellquis. I have given her a prescription today.  Because this is her third deep vein thrombosis, I think the underlying etiology needs to be better evaluated. I am referring her to hematology for a full blood work up. There has been discussions that her fibroid uterus may be compressing the vein. On today's ultrasound there is no evidence of proximal obstruction, therefore think this is less likely.  She will also have her lower extremity measures so that she can have the appropriate fitting compression stocking.     Jorge Ny, M.D. [facility] [place] Office: [phone] Pager:  [phone]

## 2013-04-01 NOTE — Addendum Note (Signed)
Addended by: Sharee Pimple on: 04/01/2013 07:49 AM   Modules accepted: Orders

## 2013-04-03 ENCOUNTER — Telehealth: Payer: Self-pay | Admitting: Hematology and Oncology

## 2013-04-03 NOTE — Telephone Encounter (Signed)
S/w pt and gve np appt 10/21 @ 10:30 w/Dr. Bertis Ruddy Referring Dr. Coral Else Dx- DVT Welcome packet mailed.

## 2013-04-03 NOTE — Telephone Encounter (Signed)
C/D 04/03/13 for appt. 04/29/13

## 2013-04-08 ENCOUNTER — Other Ambulatory Visit: Payer: Self-pay | Admitting: Family Medicine

## 2013-04-10 ENCOUNTER — Telehealth: Payer: Self-pay | Admitting: Family Medicine

## 2013-04-13 NOTE — Telephone Encounter (Signed)
Call patient . We do not have her on Diovan! Needs to be seen and she needs to bring all her medications.

## 2013-04-14 NOTE — Telephone Encounter (Signed)
Spoke with pt and she agreed to schedule appt

## 2013-04-29 ENCOUNTER — Ambulatory Visit: Payer: Medicare Other

## 2013-04-29 ENCOUNTER — Ambulatory Visit: Payer: Medicare Other | Admitting: Hematology and Oncology

## 2013-05-08 ENCOUNTER — Encounter: Payer: Self-pay | Admitting: Family Medicine

## 2013-05-08 ENCOUNTER — Ambulatory Visit (INDEPENDENT_AMBULATORY_CARE_PROVIDER_SITE_OTHER): Payer: Self-pay | Admitting: Family Medicine

## 2013-05-08 VITALS — BP 133/72 | HR 90 | Temp 97.3°F | Ht 66.5 in | Wt 210.8 lb

## 2013-05-08 DIAGNOSIS — K219 Gastro-esophageal reflux disease without esophagitis: Secondary | ICD-10-CM

## 2013-05-08 DIAGNOSIS — E559 Vitamin D deficiency, unspecified: Secondary | ICD-10-CM

## 2013-05-08 DIAGNOSIS — E669 Obesity, unspecified: Secondary | ICD-10-CM

## 2013-05-08 DIAGNOSIS — Z95828 Presence of other vascular implants and grafts: Secondary | ICD-10-CM

## 2013-05-08 DIAGNOSIS — E785 Hyperlipidemia, unspecified: Secondary | ICD-10-CM

## 2013-05-08 DIAGNOSIS — Z9889 Other specified postprocedural states: Secondary | ICD-10-CM

## 2013-05-08 DIAGNOSIS — D689 Coagulation defect, unspecified: Secondary | ICD-10-CM

## 2013-05-08 DIAGNOSIS — T753XXA Motion sickness, initial encounter: Secondary | ICD-10-CM

## 2013-05-08 DIAGNOSIS — I10 Essential (primary) hypertension: Secondary | ICD-10-CM

## 2013-05-08 DIAGNOSIS — I82409 Acute embolism and thrombosis of unspecified deep veins of unspecified lower extremity: Secondary | ICD-10-CM

## 2013-05-08 DIAGNOSIS — D6859 Other primary thrombophilia: Secondary | ICD-10-CM

## 2013-05-08 DIAGNOSIS — Z23 Encounter for immunization: Secondary | ICD-10-CM

## 2013-05-08 MED ORDER — SCOPOLAMINE 1 MG/3DAYS TD PT72: 1.0000 | MEDICATED_PATCH | TRANSDERMAL | Status: DC

## 2013-05-08 NOTE — Progress Notes (Signed)
Patient ID: Renee Pratt, female   DOB: 09-Mar-1946, 67 y.o.   MRN: [id number] SUBJECTIVE: CC: Chief Complaint  Patient presents with  . Follow-up    reck meds  wants scoplamine patch for cruise   . Medication Refill    refill all meds    HPI: Has appointment to see hematology for evaluation of her recurrent DVT. She has had 3 episodes. See notes of vascular surgeon in [facility]. Concern whether she has a hypercoagable  State. Patient plans to see her Gyn back in her home town, [place]? Because she wonders if it is due to Fibroids compressing the iliac vessels.  Gets constipated and has to use Benefiber. Only eats 2 veggies per day. Diet needs to be improved and fiber needs to be increased in diet.  Needs to increase exercise as well. Did not purchase books on nutrition.  Travelling soon and needs transdermscop for cruise. Patient is here for follow up of hyperlipidemia/HTN/Recurrent DVT/obesity: denies Headache;denies Chest Pain;denies weakness;denies Shortness of Breath and orthopnea;denies Visual changes;denies palpitations;denies cough;denies pedal edema;denies symptoms of TIA or stroke;deniesClaudication symptoms. admits to Compliance with medications; denies Problems with medications.   Past Medical History  Diagnosis Date  . Uterine fibroid   . Blood clotting tendency   . DVT, lower extremity, recurrent   . 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 Hyperlipidemia   . Hypertension   . GERD (gastroesophageal reflux disease)     hiatal hernia   Past Surgical History  Procedure Laterality Date  . Colposcopy    . Insertion of vena cava filter  01-17-2011    dr pindr # [phone]   History   Social History  . Marital Status: Divorced    Spouse Name: N/A    Number of Children: N/A  . Years of Education: N/A   Occupational History  . Not on file.   Social History Main Topics  . Smoking status: Former Smoker    Quit date: 10/17/1997  . Smokeless tobacco: Never Used  .  Alcohol Use: Yes     Comment: Socially  . Drug Use: No  . Sexual Activity: No   Other Topics Concern  . Not on file   Social History Narrative  . No narrative on file   Family History  Problem Relation Age of Onset  . Kidney disease Mother   . Hypertension Mother   . Cancer Father    Current Outpatient Prescriptions on File Prior to Visit  Medication Sig Dispense Refill  . apixaban (ELIQUIS) 5 MG TABS tablet Take 2 tablets (10 mg total) by mouth 2 (two) times daily.  75 tablet  3  . atorvastatin (LIPITOR) 20 MG tablet Take 40 mg by mouth daily.      . cholecalciferol (VITAMIN D) 1000 UNITS tablet Take 2,000 Units by mouth daily.      . hydrochlorothiazide (HYDRODIURIL) 25 MG tablet Take 1 tablet (25 mg total) by mouth daily.  30 tablet  3  . losartan (COZAAR) 100 MG tablet TAKE ONE TABLET BY MOUTH ONCE DAILY  90 tablet  3  . Multiple Vitamins-Minerals (CENTRUM SILVER PO) Take 1 tablet by mouth every other day.      Marland Kitchen NIFEdipine (PROCARDIA XL/ADALAT-CC) 60 MG 24 hr tablet Take 1 tablet (60 mg total) by mouth daily.  90 tablet  3  . terazosin (HYTRIN) 5 MG capsule Take 1 capsule (5 mg total) by mouth at bedtime.  30 capsule  3   No current facility-administered medications  on file prior to visit.   No Known Allergies Immunization History  Administered Date(s) Administered  . Influenza,inj,Quad PF,36+ Mos 05/08/2013  . Pneumococcal Polysaccharide 09/11/2012  . Tdap 07/10/2002  . Zoster 07/11/2007   Prior to Admission medications   Medication Sig Start Date End Date Taking? Authorizing Provider  apixaban (ELIQUIS) 5 MG TABS tablet Take 2 tablets (10 mg total) by mouth 2 (two) times daily. 03/31/13  Yes Nada Libman, MD  atorvastatin (LIPITOR) 20 MG tablet Take 40 mg by mouth daily. 12/20/12  Yes Ileana Ladd, MD  cholecalciferol (VITAMIN D) 1000 UNITS tablet Take 2,000 Units by mouth daily.   Yes Historical Provider, MD  hydrochlorothiazide (HYDRODIURIL) 25 MG tablet Take 1  tablet (25 mg total) by mouth daily. 03/27/13  Yes Ileana Ladd, MD  losartan (COZAAR) 100 MG tablet TAKE ONE TABLET BY MOUTH ONCE DAILY 03/27/13  Yes Ileana Ladd, MD  Multiple Vitamins-Minerals (CENTRUM SILVER PO) Take 1 tablet by mouth every other day.   Yes Historical Provider, MD  NIFEdipine (PROCARDIA XL/ADALAT-CC) 60 MG 24 hr tablet Take 1 tablet (60 mg total) by mouth daily. 03/27/13  Yes Ileana Ladd, MD  terazosin (HYTRIN) 5 MG capsule Take 1 capsule (5 mg total) by mouth at bedtime. 03/27/13  Yes Ileana Ladd, MD     ROS: As above in the HPI. All other systems are stable or negative.  OBJECTIVE: APPEARANCE:  Patient in no acute distress.The patient appeared well nourished and normally developed. Acyanotic. Waist: VITAL SIGNS:BP 133/72  Pulse 90  Temp(Src) 97.3 F (36.3 C) (Oral)  Ht 5' 6.5" (1.689 m)  Wt 210 lb 12.8 oz (95.618 kg)  BMI 33.52 kg/m2  AAF obese  SKIN: warm and  Dry without overt rashes, tattoos and scars  HEAD and Neck: without JVD, Head and scalp: normal Eyes:No scleral icterus. Fundi normal, eye movements normal. Ears: Auricle normal, canal normal, Tympanic membranes normal, insufflation normal. Nose: normal Throat: normal Neck & thyroid: normal  CHEST & LUNGS: Chest wall: normal Lungs: Clear  CVS: Reveals the PMI to be normally located. Regular rhythm, First and Second Heart sounds are normal,  absence of murmurs, rubs or gallops. Peripheral vasculature: Radial pulses: normal Dorsal pedis pulses: normal Posterior pulses: normal  ABDOMEN:  Appearance: Obese Benign, no organomegaly, no masses, no Abdominal Aortic enlargement. No Guarding , no rebound. No Bruits. Bowel sounds: normal  RECTAL: N/A GU: N/A  EXTREMETIES: 1+ edematous both legs. She has here compression stockings on.  MUSCULOSKELETAL:  Spine: normal Joints: intact  NEUROLOGIC: oriented to time,place and person; nonfocal. Strength is normal Sensory is  normal Reflexes are normal Cranial Nerves are normal.  ASSESSMENT: Other and unspecified hyperlipidemia  Unspecified essential hypertension  Blood clotting tendency  DVT, lower extremity, recurrent, unspecified laterality  Greenfield filter in place  Obesity, unspecified  Unspecified vitamin D deficiency  GERD (gastroesophageal reflux disease)  Traveling, initial encounter - Plan: scopolamine (TRANSDERM-SCOP) 1.5 MG  Sea sickness, initial encounter - Plan: scopolamine (TRANSDERM-SCOP) 1.5 MG  Need for prophylactic vaccination and inoculation against influenza  PLAN:  No orders of the defined types were placed in this encounter.   Meds ordered this encounter  Medications  . famotidine (PEPCID) 20 MG tablet    Sig: Take 20 mg by mouth 2 (two) times daily.  Marland Kitchen scopolamine (TRANSDERM-SCOP) 1.5 MG    Sig: Place 1 patch (1.5 mg total) onto the skin every 3 (three) days.    Dispense:  4  patch    Refill:  0       Dr Woodroe Mode Recommendations  For nutrition information, I recommend books:  1).Eat to Live by Dr Monico Hoar. 2).Prevent and Reverse Heart Disease by Dr Suzzette Righter. 3) Dr Katherina Right Book:  Program to Reverse Diabetes  Exercise recommendations are:  If unable to walk, then the patient can exercise in a chair 3 times a day. By flapping arms like a bird gently and raising legs outwards to the front.  If ambulatory, the patient can go for walks for 30 minutes 3 times a week. Then increase the intensity and duration as tolerated.  Goal is to try to attain exercise frequency to 5 times a week.  If applicable: Best to perform resistance exercises (machines or weights) 2 days a week and cardio type exercises 3 days per week.  Discussed with her on her recurrent DVT. Agree with work up by hematology. She needs to see her GYN and have notes sent. She is due for her next colonoscopy in March. Patient saws her GI in [place] told her that's when the next  one is due.  There are no discontinued medications. Return in about 2 months (around 07/08/2013) for Recheck medical problems.  Francie Keeling P. [place] Charon, M.D.

## 2013-05-08 NOTE — Patient Instructions (Signed)
      Dr Asa Baudoin's Recommendations  For nutrition information, I recommend books:  1).Eat to Live by Dr Joel Fuhrman. 2).Prevent and Reverse Heart Disease by Dr Caldwell Esselstyn. 3) Dr Neal Barnard's Book:  Program to Reverse Diabetes  Exercise recommendations are:  If unable to walk, then the patient can exercise in a chair 3 times a day. By flapping arms like a bird gently and raising legs outwards to the front.  If ambulatory, the patient can go for walks for 30 minutes 3 times a week. Then increase the intensity and duration as tolerated.  Goal is to try to attain exercise frequency to 5 times a week.  If applicable: Best to perform resistance exercises (machines or weights) 2 days a week and cardio type exercises 3 days per week.  

## 2013-05-13 ENCOUNTER — Ambulatory Visit: Payer: Medicare Other

## 2013-05-13 ENCOUNTER — Encounter: Payer: Self-pay | Admitting: Hematology and Oncology

## 2013-05-13 ENCOUNTER — Ambulatory Visit (HOSPITAL_BASED_OUTPATIENT_CLINIC_OR_DEPARTMENT_OTHER): Payer: Medicare Other | Admitting: Hematology and Oncology

## 2013-05-13 ENCOUNTER — Telehealth: Payer: Self-pay | Admitting: Hematology and Oncology

## 2013-05-13 ENCOUNTER — Other Ambulatory Visit (HOSPITAL_BASED_OUTPATIENT_CLINIC_OR_DEPARTMENT_OTHER): Payer: Medicare Other | Admitting: Lab

## 2013-05-13 ENCOUNTER — Telehealth: Payer: Self-pay | Admitting: Family Medicine

## 2013-05-13 VITALS — BP 162/82 | HR 79 | Temp 97.5°F | Resp 18 | Ht 66.5 in | Wt 209.0 lb

## 2013-05-13 DIAGNOSIS — K5900 Constipation, unspecified: Secondary | ICD-10-CM

## 2013-05-13 DIAGNOSIS — K5904 Chronic idiopathic constipation: Secondary | ICD-10-CM | POA: Insufficient documentation

## 2013-05-13 DIAGNOSIS — I82401 Acute embolism and thrombosis of unspecified deep veins of right lower extremity: Secondary | ICD-10-CM

## 2013-05-13 DIAGNOSIS — I82409 Acute embolism and thrombosis of unspecified deep veins of unspecified lower extremity: Secondary | ICD-10-CM

## 2013-05-13 DIAGNOSIS — R635 Abnormal weight gain: Secondary | ICD-10-CM

## 2013-05-13 LAB — TSH CHCC: TSH: 0.905 m(IU)/L (ref 0.308–3.960)

## 2013-05-13 LAB — CBC WITH DIFFERENTIAL/PLATELET
Basophils Absolute: 0 10*3/uL (ref 0.0–0.1)
Eosinophils Absolute: 0.1 10*3/uL (ref 0.0–0.5)
HCT: 38.3 % (ref 34.8–46.6)
HGB: 12.4 g/dL (ref 11.6–15.9)
MCH: 28.8 pg (ref 25.1–34.0)
MCV: 88.9 fL (ref 79.5–101.0)
MONO#: 0.3 10*3/uL (ref 0.1–0.9)
NEUT#: 1.9 10*3/uL (ref 1.5–6.5)
NEUT%: 46 % (ref 38.4–76.8)
RDW: 15.1 % — ABNORMAL HIGH (ref 11.2–14.5)
lymph#: 1.7 10*3/uL (ref 0.9–3.3)

## 2013-05-13 LAB — COMPREHENSIVE METABOLIC PANEL (CC13)
Albumin: 3.9 g/dL (ref 3.5–5.0)
Anion Gap: 9 mEq/L (ref 3–11)
BUN: 9.4 mg/dL (ref 7.0–26.0)
CO2: 26 mEq/L (ref 22–29)
Calcium: 9.7 mg/dL (ref 8.4–10.4)
Chloride: 107 mEq/L (ref 98–109)
Creatinine: 0.8 mg/dL (ref 0.6–1.1)
Glucose: 90 mg/dl (ref 70–140)
Potassium: 4.1 mEq/L (ref 3.5–5.1)

## 2013-05-13 LAB — T4, FREE: Free T4: 1.26 ng/dL (ref 0.80–1.80)

## 2013-05-13 NOTE — Progress Notes (Signed)
[facility] CONSULT NOTE  Patient Care Team: Ileana Ladd, MD as PCP - General (Family Medicine)  CHIEF COMPLAINTS/PURPOSE OF CONSULTATION:  Recurrent right lower extremity DVT  HISTORY OF PRESENTING ILLNESS:  Renee Pratt 67 y.o. female is here because of recurrent DVT. With her first episode of DVT, he was in early 2012 and appeared unprovoked. She woke a one day with some cramps in her right lower extremity and was told she had a blood clot after Doppler ultrasound. She was anticoagulated with warfarin for 3-6 months. In July 2012, she had another episode of right lower extremity pain. She denies any history of shortness breath or chest pain but apparently imaging study review possible asymptomatic PE. A right lower extremity IVC filter was placed and she was re anticoagulated with warfarin for a year and was subsequently discontinued. Most recently, she had another discomfort in the right upper extremity. On 03/31/2013, repeat Doppler ultrasound of the right lower extremity reveals acute on chronic DVT. She was placed on Eliquis  She has chronic right lower extremity swelling, with warmth, tenderness & skin discoloration.  She denies recent chest pain on exertion, shortness of breath on minimal exertion, pre-syncopal episodes, hemoptysis, or palpitation. She denies recent history of trauma, long distance travel, dehydration, recent surgery, smoking or prolonged immobilization. She had no prior history or diagnosis of cancer. Her age appropriate screening programs are up-to-date. She had prior surgeries before and never had perioperative thromboembolic events. The patient had been exposed to birth control pills but not hormone replacement therapy. The patient had been pregnant before and denies history of peripartum thromboembolic event or history of recurrent miscarriages. There is no family history of blood clots in maternal cousins.Marland Kitchen  MEDICAL HISTORY:  Past Medical  History  Diagnosis Date  . Uterine fibroid   . Blood clotting tendency   . DVT, lower extremity, recurrent   . 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 Hyperlipidemia   . Hypertension   . GERD (gastroesophageal reflux disease)     hiatal hernia  . Weight gain 05/13/2013  . Constipation 05/13/2013    SURGICAL HISTORY: Past Surgical History  Procedure Laterality Date  . Colposcopy    . Insertion of vena cava filter  01-17-2011    dr pindr # [phone]    SOCIAL HISTORY: History   Social History  . Marital Status: Divorced    Spouse Name: N/A    Number of Children: N/A  . Years of Education: N/A   Occupational History  . Not on file.   Social History Main Topics  . Smoking status: Former Smoker -- 0.25 packs/day for 20 years    Quit date: 10/17/1997  . Smokeless tobacco: Never Used  . Alcohol Use: Yes     Comment: Socially  . Drug Use: No  . Sexual Activity: No   Other Topics Concern  . Not on file   Social History Narrative  . No narrative on file    FAMILY HISTORY: Family History  Problem Relation Age of Onset  . Kidney disease Mother   . Hypertension Mother   . Cancer Father 49    lung cancer    ALLERGIES:  has No Known Allergies.  MEDICATIONS:  Current Outpatient Prescriptions  Medication Sig Dispense Refill  . apixaban (ELIQUIS) 5 MG TABS tablet Take 2 tablets (10 mg total) by mouth 2 (two) times daily.  75 tablet  3  . atorvastatin (LIPITOR) 20 MG tablet Take 40 mg by  mouth daily.      . cholecalciferol (VITAMIN D) 1000 UNITS tablet Take 2,000 Units by mouth daily.      . famotidine (PEPCID) 20 MG tablet Take 20 mg by mouth 2 (two) times daily.      . hydrochlorothiazide (HYDRODIURIL) 25 MG tablet Take 1 tablet (25 mg total) by mouth daily.  30 tablet  3  . losartan (COZAAR) 100 MG tablet TAKE ONE TABLET BY MOUTH ONCE DAILY  90 tablet  3  . Multiple Vitamins-Minerals (CENTRUM SILVER PO) Take 1 tablet by mouth every other day.      Marland Kitchen NIFEdipine  (PROCARDIA XL/ADALAT-CC) 60 MG 24 hr tablet Take 1 tablet (60 mg total) by mouth daily.  90 tablet  3  . scopolamine (TRANSDERM-SCOP) 1.5 MG Place 1 patch (1.5 mg total) onto the skin every 3 (three) days.  4 patch  0  . terazosin (HYTRIN) 5 MG capsule Take 1 capsule (5 mg total) by mouth at bedtime.  30 capsule  3   No current facility-administered medications for this visit.    REVIEW OF SYSTEMS:   Constitutional: Denies fevers, chills or abnormal night sweats. She had some mild weight gain Eyes: Denies blurriness of vision, double vision or watery eyes Ears, nose, mouth, throat, and face: Denies mucositis or sore throat Respiratory: Denies cough, dyspnea or wheezes Gastrointestinal:  Denies nausea, heartburn. She had new onset of severe constipation and only moving her bowels once every 2 weeks Skin: Denies abnormal skin rashes Lymphatics: Denies new lymphadenopathy or easy bruising Neurological:Denies numbness, tingling or new weaknesses Behavioral/Psych: Mood is stable, no new changes  All other systems were reviewed with the patient and are negative.  PHYSICAL EXAMINATION: ECOG PERFORMANCE STATUS: 1 - Symptomatic but completely ambulatory  Filed Vitals:   05/13/13 1110  BP: 162/82  Pulse: 79  Temp: 97.5 F (36.4 C)  Resp: 18   Filed Weights   05/13/13 1110  Weight: 209 lb (94.802 kg)    GENERAL:alert, no distress and comfortable. She looked mildly obese SKIN: skin color, texture, turgor are normal, no rashes or significant lesions EYES: normal, conjunctiva are pink and non-injected, sclera clear OROPHARYNX:no exudate, no erythema and lips, buccal mucosa, and tongue normal  NECK: supple, thyroid normal size, non-tender, without nodularity LYMPH:  no palpable lymphadenopathy in the cervical, axillary or inguinal LUNGS: clear to auscultation and percussion with normal breathing effort HEART: regular rate & rhythm and no murmurs. There is mild right lower extremity edema  with skin discoloration ABDOMEN:abdomen soft, non-tender and normal bowel sounds Musculoskeletal:no cyanosis of digits and no clubbing  PSYCH: alert & oriented x 3 with fluent speech NEURO: no focal motor/sensory deficits  LABORATORY DATA:  I have reviewed the data as listed Recent Results (from the past 2160 hour(s))  CMP14+EGFR     Status: Abnormal   Collection Time    03/27/13 12:06 PM      Result Value Range   Glucose 89  65 - 99 mg/dL   BUN 8  8 - 27 mg/dL   Creatinine, Ser 1.61  0.57 - 1.00 mg/dL   GFR calc non Af Amer 76  >59 mL/min/1.73   GFR calc Af Amer 88  >59 mL/min/1.73   BUN/Creatinine Ratio 10 (*) 11 - 26   Sodium 141  134 - 144 mmol/L   Potassium 3.5  3.5 - 5.2 mmol/L   Chloride 96 (*) 97 - 108 mmol/L   CO2 28  18 - 29 mmol/L  Calcium 9.6  8.6 - 10.2 mg/dL   Total Protein 7.1  6.0 - 8.5 g/dL   Albumin 4.5  3.6 - 4.8 g/dL   Globulin, Total 2.6  1.5 - 4.5 g/dL   Albumin/Globulin Ratio 1.7  1.1 - 2.5   Total Bilirubin 0.3  0.0 - 1.2 mg/dL   Alkaline Phosphatase 76  39 - 117 IU/L   AST 13  0 - 40 IU/L   ALT 10  0 - 32 IU/L  NMR, LIPOPROFILE     Status: Abnormal   Collection Time    03/27/13 12:06 PM      Result Value Range   LDL Particle Number [id number] (*) <1000 nmol/L   Comment:                           Low                   < 1000                               Moderate         1000 - 1299                               Borderline-High  1300 - 1599                               High             1600 - 2000                               Very High             > 2000   LDLC SERPL CALC-MCNC 170 (*) <100 mg/dL   Comment: LDL-C is inaccurate if patient is nonfasting.                               Optimal               <  100                               Above optimal     100 -  129                               Borderline        130 -  159                               High              160 -  189                               Very high             >  189   HDL  Cholesterol by NMR 49  >=40 mg/dL   Triglycerides by NMR 220 (*) <  150 mg/dL   Cholesterol 161 (*) <096 mg/dL   HDL Particle Number [id number]  >=40.9 umol/L   Small LDL Particle Number [id number] (*) <=527 nmol/L   LDL Size 20.9  >20.5 nm   Comment:  ----------------------------------------------------------                      ** INTERPRETATIVE INFORMATION**                      PARTICLE CONCENTRATION AND SIZE                         <--Lower CVD Risk   Higher CVD Risk-->       LDL AND HDL PARTICLES   Percentile in Reference Population       HDL-P (total)        High     75th    50th    25th   Low                            >34.9    34.9    30.5    26.7   <26.7       Small LDL-P          Low      25th    50th    75th   High                            <117     117     527     839    >839       LDL Size   <-Large (Pattern A)->    <-Small (Pattern B)->                         23.0    20.6           20.5      19.0      ----------------------------------------------------------   LP-IR Score 90 (*) <=45   Comment: LP-IR Score is inaccurate if patient is non-fasting.     The LP-IR Score combines information from lipoprotein     particle concentration and size to give improved     assessment of insulin resistance and diabetes risk.     Small LDL-P, LDL Particle Size, HDL-Particle, and     LP-IR Score have been validated by [employer]     but not cleared by [place] FDA; the clinical utility     of these test results has not been fully established.     INSULIN RESISTANCE MARKER         <--Insulin Sensitive    Insulin Resistant-->                Percentile in Reference Population     Insulin Resistance Score     LP-IR Score   Low   25th   50th   75th   High                   <27   27     45     63     >63  VITAMIN D 25 HYDROXY     Status: Abnormal   Collection Time    03/27/13 12:06 PM      Result Value Range  Vit D, 25-Hydroxy 21.4 (*) 30.0 - 100.0 ng/mL   Comment: Vitamin D deficiency has been  defined by the Institute of     Medicine and an Endocrine Society practice guideline as a     level of serum 25-OH vitamin D less than 20 ng/mL (1,2).     The Endocrine Society went on to further define vitamin D     insufficiency as a level between 21 and 29 ng/mL (2).     1. IOM (Institute of Medicine). 2010. Dietary reference        intakes for calcium and D. [place]: The        [employer].     2. Holick MF, Binkley Kaufman, Bischoff-Ferrari HA, et al.        Evaluation, treatment, and prevention of vitamin D        deficiency: an Endocrine Society clinical practice        guideline. JCEM. 2011 Jul; 96(7):1911-30.    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  This last episode of blood clot appeared to be unprovoked. We discussed about the pros and cons about testing for thrombophilia disorder. her current anticoagulation therapy will interfere with some the tests and it is not possible to interpret the test results.  Taking her off the anticoagulation therapy to do the tests may precipitate another thrombotic event. I do not see a reason to order excessive testing to screen for thrombophilia disorder as it would not change our management.  The goal of anticoagulation therapy is for life.   We discussed about various options of anticoagulation therapies including warfarin, low molecular weight heparin such as Lovenox or newer agents such as Rivaroxaban. Some of the risks and benefits discussed including costs involved, the need for monitoring, risks of life-threatening bleeding/hospitalization, reversibility of each agent in the event of bleeding or overdose, safety profile of each drug and taking into account other social issues such as ease of administration of  medications, etc. Ultimately, we have made an informed decision for the patient to continue her treatment with Eliquis  I recommend the patient to use elastic compression stockings at 20-30 mmHg to reduce risks of chronic thrombophlebitis.  Another main issue we discussed today included the role of screening other family members for thrombophilia disorder. At present time, I would not recommend testing the patient's family members as it would not benefit them.  Thrombophilia disorder is a genetic predisposition which increases an individual's risk for a thrombotic event, NOT a disease.  We discussed the implications of genetic screening including the possibility of uninsurability, costs involved, emotional distress and possible discrimination at various levels for the affected individual.  Rather than genetic screening, one can possibly benefit from genetic counseling or dissemination of appropriate reading materials to educate other family members.  Finally, at the end of our consultation today, I reinforced the importance of preventive strategies such as avoiding hormonal supplement, avoiding cigarette smoking, keeping up-to-date with screening programs for early cancer detection, frequent ambulation for long distance travel and aggressive DVT prophylaxis in all surgical settings.  Due to new onset of changes in her bowel habits, I recommend CT scan of the chest abdomen and pelvis to rule out malignancy. The patient had history of smoking. I will also order some additional  workup especially with her weight gain and severe constipation mentioned that she does not have hypothyroidism. I will also refer her to a gastroenterologist for repeat colonoscopy. I will see her back in 3 weeks to review test results.  Orders Placed This Encounter  Procedures  . CT Abdomen Pelvis W Contrast    Standing Status: Future     Number of Occurrences:      Standing Expiration Date: 08/13/2014    Order Specific  Question:  Reason for Exam (SYMPTOM  OR DIAGNOSIS REQUIRED)    Answer:  new onset constipation, recurrent blood clot, exclude cancer    Order Specific Question:  Preferred imaging location?    Answer:  [facility]  . CT Chest W Contrast    Standing Status: Future     Number of Occurrences:      Standing Expiration Date: 05/13/2014    Order Specific Question:  Reason for exam:    Answer:  Hx smoking, recurrent blood clot    Order Specific Question:  Preferred imaging location?    Answer:  [facility]  . CBC with Differential    Standing Status: Future     Number of Occurrences:      Standing Expiration Date: 02/02/2014  . Comprehensive metabolic panel    Standing Status: Future     Number of Occurrences:      Standing Expiration Date: 05/13/2014  . T4, free    Standing Status: Future     Number of Occurrences:      Standing Expiration Date: 05/13/2014  . TSH    Standing Status: Future     Number of Occurrences:      Standing Expiration Date: 05/13/2014  . Ambulatory referral to Gastroenterology    Referral Priority:  Urgent    Referral Type:  Consultation    Referral Reason:  Specialty Services Required    Requested Specialty:  Gastroenterology    Number of Visits Requested:  1    All questions were answered. The patient knows to call the clinic with any problems, questions or concerns.   Zakari Couchman, MD 05/13/2013 12:03 PM

## 2013-05-13 NOTE — Telephone Encounter (Signed)
sw pt again after she had labs she will call tomorrow and advise name of gastro in [place] Pt wants appt  prior to 12/20 if at all possible shh

## 2013-05-13 NOTE — Telephone Encounter (Signed)
worked 11/4 POF avs and cal mailed to pt as she had to go on to lab today Pt is to call us w name of a gastroenterologist she wants to see in [place] so we can make a referral appt shh

## 2013-05-13 NOTE — Progress Notes (Signed)
Checked in new patient with no financial issues and she has her appt card. She has not been to [place]

## 2013-05-14 ENCOUNTER — Telehealth: Payer: Self-pay | Admitting: Hematology and Oncology

## 2013-05-14 ENCOUNTER — Other Ambulatory Visit: Payer: Self-pay | Admitting: Hematology and Oncology

## 2013-05-14 DIAGNOSIS — I82401 Acute embolism and thrombosis of unspecified deep veins of right lower extremity: Secondary | ICD-10-CM

## 2013-05-14 NOTE — Telephone Encounter (Signed)
sw Diana Eves at [facility] and verified she could see referral in Epic.  She will work [employer] and call pt back...shh

## 2013-05-14 NOTE — Telephone Encounter (Signed)
wong to address

## 2013-05-15 ENCOUNTER — Encounter: Payer: Self-pay | Admitting: Gastroenterology

## 2013-05-16 ENCOUNTER — Other Ambulatory Visit: Payer: Self-pay | Admitting: Family Medicine

## 2013-05-16 NOTE — Telephone Encounter (Signed)
I see that it was done in EPIC already by the hematologist

## 2013-05-16 NOTE — Telephone Encounter (Signed)
Pt notiifed and stated she does not have colon /endo sch but does have CT abd/chest scheduled for Nov 20 and would like to have endo colon schduled before her cruise on Dec 20 . Advised pt to call for the results on CT so we can expedite other tests

## 2013-05-20 ENCOUNTER — Encounter: Payer: Self-pay | Admitting: Gastroenterology

## 2013-05-20 ENCOUNTER — Telehealth: Payer: Self-pay | Admitting: Gastroenterology

## 2013-05-20 ENCOUNTER — Ambulatory Visit (INDEPENDENT_AMBULATORY_CARE_PROVIDER_SITE_OTHER): Payer: Medicare Other | Admitting: Gastroenterology

## 2013-05-20 VITALS — BP 139/81 | HR 96 | Temp 97.4°F | Wt 208.0 lb

## 2013-05-20 DIAGNOSIS — R109 Unspecified abdominal pain: Secondary | ICD-10-CM

## 2013-05-20 DIAGNOSIS — R6881 Early satiety: Secondary | ICD-10-CM

## 2013-05-20 DIAGNOSIS — K219 Gastro-esophageal reflux disease without esophagitis: Secondary | ICD-10-CM

## 2013-05-20 DIAGNOSIS — K5900 Constipation, unspecified: Secondary | ICD-10-CM

## 2013-05-20 DIAGNOSIS — R194 Change in bowel habit: Secondary | ICD-10-CM

## 2013-05-20 DIAGNOSIS — R198 Other specified symptoms and signs involving the digestive system and abdomen: Secondary | ICD-10-CM

## 2013-05-20 DIAGNOSIS — R1010 Upper abdominal pain, unspecified: Secondary | ICD-10-CM

## 2013-05-20 MED ORDER — LINACLOTIDE 290 MCG PO CAPS: ORAL_CAPSULE | ORAL | Status: DC

## 2013-05-20 MED ORDER — NEXIUM 40 MG PO CPDR: 40.0000 mg | DELAYED_RELEASE_CAPSULE | Freq: Every day | ORAL | Status: DC

## 2013-05-20 NOTE — Telephone Encounter (Signed)
I called pt. She said the [facility] in [place] would not honor the [employer] Per Raynelle Fanning, we can send to [facility] and they will honor the card. Pt said to [facility] in [place].

## 2013-05-20 NOTE — Progress Notes (Signed)
Dr. Darrick Penna, patient will likely need Lovenox bridge prior to TCS/EGD. Will see if Dr. Bertis Ruddy agrees with this approach since patient has h/o recurrent DVT, last on 03/2013. Now on Eliquis.

## 2013-05-20 NOTE — Assessment & Plan Note (Signed)
67 year-old with recurrent right lower show any DVT now on Eliquis who presents for change in bowel habits, constipation, early satiety, reflux, upper abdominal pain. Previously had manageable constipation but now taking numerous over-the-counter laxatives without good results. States that she was due for followup colonoscopy in March due to history of polyps. We have been asked to perform a colonoscopy given these findings. She has a fairly acute DVT diagnosed back in September. If we pursue endoscopic evaluation at this time, she will need to be off of anticoagulation and would provide Lovenox bridging. I will discuss with Dr. Bertis Ruddy and Dr. Darrick Penna prior to making arrangements. Requested copy of prior colonoscopy and path.  Start Linzess orally daily for constipation. Voucher for 30 days of samples and RX provided.

## 2013-05-20 NOTE — Telephone Encounter (Signed)
Done

## 2013-05-20 NOTE — Telephone Encounter (Signed)
Pt had OV today and had seen [facility]. Patient called this afternoon to ask if we could recommend something other than Linzess because it was too expensive. $45 for a 30 day supply. She uses [facility] in [place]. Please advise. [phone]

## 2013-05-20 NOTE — Progress Notes (Addendum)
REVIEWED. Discuss with hematology. FAVOR NO ENDOSCOPY FOR 3-6 MOS UNLESS PT HAS ACTIVE GI BLEED

## 2013-05-20 NOTE — Assessment & Plan Note (Addendum)
Refractory GERD, early satiety, upper abdominal pain. Never been on a PPI. Reports remote upper endoscopy. Consider upper endoscopy at time of colonoscopy. Stop Pepcid. Begin Nexium 40 mg daily. #10 samples and prescription provided. Discussed antireflux measures. Followup pending CT scans. Further recommendations to follow.

## 2013-05-20 NOTE — Progress Notes (Signed)
Primary Care Physician:  [name], MD  Primary Gastroenterologist:  Jonette Eva, MD   Chief Complaint  Patient presents with  . Constipation    HPI:  Renee Pratt is a 67 y.o. female here for consultation at request of her hematologist, Dr. Bertis Ruddy. She has had change in bowel habits along with some upper GI complaints. She has history of recurrent right lower extremity DVT. Initial episode back in 2011. In 2012, she had IVC filter placed after her second episode. Both times treated short-term with coumadin. In 03/2013 she was found to have acute right lower extremity DVT and chronic lower extremity DVT. Put on Eliquis in 03/2013.  CT scan of chest, abdomen, pelvis scheduled to rule out malignancy per Dr. Bertis Ruddy.  Change in bowel habits for four months. Prior to that constipation was manageable with stool softeners. Lately on Benefiber daily, stool softeners three at a time QOD, taking lot of magnesium citrate/ExLax but doesn't feel like good relief. No melena, brbpr. Epigastric and LUQ pain. Complains of early satiety, heartburn. Tried Activia. Coffee, fried foods, red sauces causes lot of belching, burning. Does okay if eats really light. Feels nausea. No dysphagia. No weight loss. Gained 10 pounds in one year.   Previous endoscopies done in [place]. Last colonoscopy 5 years, due in 09/2013. H/o polyps. EGD, hiatal hernia, remotely. No PUD. Never been on PPI for GERD.  Current Outpatient Prescriptions  Medication Sig Dispense Refill  . apixaban (ELIQUIS) 5 MG TABS tablet Take 2 tablets (10 mg total) by mouth 2 (two) times daily.  75 tablet  3  . atorvastatin (LIPITOR) 20 MG tablet Take 40 mg by mouth daily.      . cholecalciferol (VITAMIN D) 1000 UNITS tablet Take 2,000 Units by mouth daily.      . famotidine (PEPCID) 20 MG tablet Take 20 mg by mouth 2 (two) times daily.      . hydrochlorothiazide (HYDRODIURIL) 25 MG tablet Take 1 tablet (25 mg total) by mouth daily.  30  tablet  3  . losartan (COZAAR) 100 MG tablet TAKE ONE TABLET BY MOUTH ONCE DAILY  90 tablet  3  . Multiple Vitamins-Minerals (CENTRUM SILVER PO) Take 1 tablet by mouth every other day.      Marland Kitchen NIFEdipine (PROCARDIA XL/ADALAT-CC) 60 MG 24 hr tablet Take 1 tablet (60 mg total) by mouth daily.  90 tablet  3  . scopolamine (TRANSDERM-SCOP) 1.5 MG Place 1 patch (1.5 mg total) onto the skin every 3 (three) days.  4 patch  0  . terazosin (HYTRIN) 5 MG capsule Take 1 capsule (5 mg total) by mouth at bedtime.  30 capsule  3   No current facility-administered medications for this visit.    Allergies as of 05/20/2013  . (No Known Allergies)    Past Medical History  Diagnosis Date  . Uterine fibroid   . Blood clotting tendency   . DVT, lower extremity, recurrent     Right sided. First time 2011, another in 2012, 03/2013  . 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 Hyperlipidemia   . Hypertension   . GERD (gastroesophageal reflux disease)     hiatal hernia  . Weight gain 05/13/2013  . Constipation 05/13/2013    Past Surgical History  Procedure Laterality Date  . Colposcopy    . Insertion of vena cava filter  01-17-2011    dr pindr # [phone]    Family History  Problem Relation Age of  Onset  . Kidney disease Mother   . Hypertension Mother   . Cancer Father 65    lung cancer  . Colon cancer Neg Hx   . Clotting disorder Neg Hx   . Other Cousin     died fo pulmonary embolus    History   Social History  . Marital Status: Divorced    Spouse Name: N/A    Number of Children: 3  . Years of Education: N/A   Occupational History  . Not on file.   Social History Main Topics  . Smoking status: Former Smoker -- 0.25 packs/day for 20 years    Quit date: 10/17/1997  . Smokeless tobacco: Never Used  . Alcohol Use: Yes     Comment: Socially  . Drug Use: No  . Sexual Activity: No   Other Topics Concern  . Not on file   Social History Narrative  . No narrative on file       ROS:  General: Negative for anorexia, weight loss, fever, chills, fatigue, weakness. Eyes: Negative for vision changes.  ENT: Negative for hoarseness, difficulty swallowing , nasal congestion. CV: Negative for chest pain, angina, palpitations, dyspnea on exertion, peripheral edema.  Respiratory: Negative for dyspnea at rest, dyspnea on exertion, cough, sputum, wheezing.  GI: See history of present illness. GU:  Negative for dysuria, hematuria, urinary incontinence, urinary frequency, nocturnal urination.  MS: Negative for joint pain, low back pain. Pain in right lower extremity.  Derm: Negative for rash or itching.  Neuro: Negative for weakness, abnormal sensation, seizure, frequent headaches, memory loss, confusion.  Psych: Negative for anxiety, depression, suicidal ideation, hallucinations.  Endo: Negative for unusual weight change.  Heme: Negative for bruising or bleeding. Allergy: Negative for rash or hives.    Physical Examination:  BP 139/81  Pulse 96  Temp(Src) 97.4 F (36.3 C) (Oral)  Wt 208 lb (94.348 kg)   General: Well-nourished, well-developed in no acute distress.  Head: Normocephalic, atraumatic.   Eyes: Conjunctiva pink, no icterus. Mouth: Oropharyngeal mucosa moist and pink , no lesions erythema or exudate. Neck: Supple without thyromegaly, masses, or lymphadenopathy.  Lungs: Clear to auscultation bilaterally.  Heart: Regular rate and rhythm, no murmurs rubs or gallops.  Abdomen: Bowel sounds are normal, nontender, nondistended, no hepatosplenomegaly or masses, no abdominal bruits or    hernia , no rebound or guarding.   Rectal: not performed Extremities: No lower extremity edema. No clubbing or deformities.  Neuro: Alert and oriented x 4 , grossly normal neurologically.  Skin: Warm and dry, no rash or jaundice.   Psych: Alert and cooperative, normal mood and affect.  Labs: Lab Results  Component Value Date   TSH 0.905 05/13/2013   Lab Results  Component  Value Date   CREATININE 0.8 05/13/2013   BUN 9.4 05/13/2013   NA 142 05/13/2013   K 4.1 05/13/2013   CL 96* 03/27/2013   CO2 26 05/13/2013   Lab Results  Component Value Date   ALT 11 05/13/2013   AST 13 05/13/2013   ALKPHOS 72 05/13/2013   BILITOT 0.39 05/13/2013   Lab Results  Component Value Date   WBC 4.1 05/13/2013   HGB 12.4 05/13/2013   HCT 38.3 05/13/2013   MCV 88.9 05/13/2013   PLT 202 05/13/2013     Imaging Studies: No results found.

## 2013-05-20 NOTE — Patient Instructions (Signed)
1. Stop Pepcid, Magnesium citrate, Exlax. 2. Start Nexium once daily before breakfast for acid reflux. 3. Start Linzess one capsule daily on empty stomach for constipation. 4. I will discuss with your doctors regarding management of your blood thinners prior to scheduling colonoscopy and upper endoscopy. 5. I will follow-up on CT scan results as available. 6. We have requested copy of your prior colonoscopy and upper endoscopy results.

## 2013-05-21 NOTE — Progress Notes (Signed)
cc'd to pcp 

## 2013-05-23 NOTE — Progress Notes (Signed)
[place] Colonoscopy dated 09/28/2003 showed few medium scattered diverticula in the sigmoid and descending colon, muscular deformity c/w IBD, moderate nonbleeding internal and external hemorrhoids.  Colonoscopy in June 2012 showed moderately severe diverticulosis of the sigmoid and descending colon, muscular deformity consistent with IBS, single small flat polyp in the rectum, internal and external hemorrhoids. Recommend colonoscopy in 3 years. Pathology showed hyperplastic polyp.

## 2013-05-28 ENCOUNTER — Ambulatory Visit: Payer: Medicare Other | Admitting: Gastroenterology

## 2013-05-29 ENCOUNTER — Telehealth: Payer: Self-pay | Admitting: Gastroenterology

## 2013-05-29 ENCOUNTER — Ambulatory Visit (HOSPITAL_COMMUNITY)
Admission: RE | Admit: 2013-05-29 | Discharge: 2013-05-29 | Disposition: A | Discharge status: Home / Self Care | Payer: Medicare Other | Source: Ambulatory Visit | Attending: Hematology and Oncology | Admitting: Hematology and Oncology

## 2013-05-29 ENCOUNTER — Encounter (HOSPITAL_COMMUNITY): Payer: Self-pay

## 2013-05-29 DIAGNOSIS — K449 Diaphragmatic hernia without obstruction or gangrene: Secondary | ICD-10-CM | POA: Insufficient documentation

## 2013-05-29 DIAGNOSIS — Z87891 Personal history of nicotine dependence: Secondary | ICD-10-CM | POA: Insufficient documentation

## 2013-05-29 DIAGNOSIS — I700 Atherosclerosis of aorta: Secondary | ICD-10-CM | POA: Insufficient documentation

## 2013-05-29 DIAGNOSIS — K5900 Constipation, unspecified: Secondary | ICD-10-CM | POA: Insufficient documentation

## 2013-05-29 DIAGNOSIS — R599 Enlarged lymph nodes, unspecified: Secondary | ICD-10-CM | POA: Insufficient documentation

## 2013-05-29 DIAGNOSIS — R635 Abnormal weight gain: Secondary | ICD-10-CM

## 2013-05-29 DIAGNOSIS — I82409 Acute embolism and thrombosis of unspecified deep veins of unspecified lower extremity: Secondary | ICD-10-CM | POA: Insufficient documentation

## 2013-05-29 DIAGNOSIS — I82401 Acute embolism and thrombosis of unspecified deep veins of right lower extremity: Secondary | ICD-10-CM

## 2013-05-29 DIAGNOSIS — D259 Leiomyoma of uterus, unspecified: Secondary | ICD-10-CM | POA: Insufficient documentation

## 2013-05-29 DIAGNOSIS — N281 Cyst of kidney, acquired: Secondary | ICD-10-CM | POA: Insufficient documentation

## 2013-05-29 MED ORDER — IOHEXOL 300 MG/ML  SOLN
100.0000 mL | Freq: Once | INTRAMUSCULAR | Status: AC | PRN
  Administered 2013-05-29: 100 mL via INTRAVENOUS

## 2013-05-29 MED ORDER — IOHEXOL 300 MG/ML  SOLN
50.0000 mL | Freq: Once | INTRAMUSCULAR | Status: AC | PRN
  Administered 2013-05-29: 50 mL via ORAL

## 2013-05-29 NOTE — Telephone Encounter (Signed)
Message copied by Tiffany Kocher on Thu May 29, 2013  4:21 PM ------      Message from: [facility], [place]      Created: Wed May 21, 2013  7:51 AM       Holding Eliquis for 2 days should be sufficient      Safest time to do procedures would be approx 3 months away from most recent clot (last clot was in Sept).      I don't feel strongly she needs bridging therapy especially if biopsy is needed if something shows up during the procedure      She can restart Eliquis the evening of the procedure but if multiple biopsies were taken then restart Eliquis the next morning      ----- Message -----         From: Tiffany Kocher, PA-C         Sent: 05/20/2013   3:48 PM           To: Tiffany Kocher, PA-C, Artis Delay, MD            Dr. Bertis Ruddy,             I saw this patient at your request for consideration of colonoscopy given bowel changes. She also has upper GI issues in need of upper endoscopy. Would you go along with Lovenox bridge with her as we cannot perform endoscopy on Eliquis? If so, would holding Eliquis for two days be sufficient to reverse it's effects.             Renee Battles. Melvyn Neth, PA-C      11/11/20143:51 PM                   ------

## 2013-05-29 NOTE — Telephone Encounter (Signed)
Please let patient know that Dr. Bertis Ruddy and Dr. Darrick Penna both recommend waiting at least 3 months after the last blood clot before performing colonoscopy and EGD unless active bleeding noted.   Her last blood clot was diagnosed 03/31/13.  Let's have her come back in 07/2013. We can make arrangements at that time for the procedures.  If anything changes, she should let us know sooner. Continue Linzess and Nexium started at OV 05/20/13.

## 2013-06-02 ENCOUNTER — Other Ambulatory Visit: Payer: Self-pay | Admitting: Hematology and Oncology

## 2013-06-02 NOTE — Telephone Encounter (Signed)
Reminder in epic °

## 2013-06-03 ENCOUNTER — Other Ambulatory Visit: Payer: Medicare Other | Admitting: Lab

## 2013-06-03 ENCOUNTER — Ambulatory Visit (HOSPITAL_BASED_OUTPATIENT_CLINIC_OR_DEPARTMENT_OTHER): Payer: Medicare Other | Admitting: Hematology and Oncology

## 2013-06-03 VITALS — BP 167/83 | HR 80 | Temp 98.1°F | Resp 18 | Ht 66.5 in | Wt 210.8 lb

## 2013-06-03 DIAGNOSIS — I82409 Acute embolism and thrombosis of unspecified deep veins of unspecified lower extremity: Secondary | ICD-10-CM

## 2013-06-03 DIAGNOSIS — K5900 Constipation, unspecified: Secondary | ICD-10-CM

## 2013-06-03 DIAGNOSIS — I82401 Acute embolism and thrombosis of unspecified deep veins of right lower extremity: Secondary | ICD-10-CM

## 2013-06-03 NOTE — Progress Notes (Signed)
[facility] OFFICE PROGRESS NOTE  Renee Baseman, MD DIAGNOSIS:  Recurrent DVT  SUMMARY OF HEMATOLOGIC HISTORY: Please see my detailed dictation on 05/13/2013 for further details. This patient has history of DVT diagnosed in 2012 with asymptomatic PE. She had placement of IVC filter was never removed. On 03/31/2013, she had repeat Doppler ultrasound which confirmed a recurrence of DVT on the right leg. INTERVAL HISTORY: Renee Pratt 67 y.o. female returns for further followup. The patient still have significant constipation issue. While on anticoagulation therapy, the patient denies any recent signs or symptoms of bleeding such as spontaneous epistaxis, hematuria or hematochezia. She denies any recent weight loss. No pain in her abdomen.  I have reviewed the past medical history, past surgical history, social history and family history with the patient and they are unchanged from previous note.  ALLERGIES:  has No Known Allergies.  MEDICATIONS:  Current Outpatient Prescriptions  Medication Sig Dispense Refill  . apixaban (ELIQUIS) 5 MG TABS tablet Take 2 tablets (10 mg total) by mouth 2 (two) times daily.  75 tablet  3  . atorvastatin (LIPITOR) 20 MG tablet Take 40 mg by mouth daily.      . cholecalciferol (VITAMIN D) 1000 UNITS tablet Take 2,000 Units by mouth daily.      . hydrochlorothiazide (HYDRODIURIL) 25 MG tablet Take 1 tablet (25 mg total) by mouth daily.  30 tablet  3  . Linaclotide (LINZESS) 290 MCG CAPS capsule One capsule daily on empty stomach.  30 capsule  5  . losartan (COZAAR) 100 MG tablet TAKE ONE TABLET BY MOUTH ONCE DAILY  90 tablet  3  . Multiple Vitamins-Minerals (CENTRUM SILVER PO) Take 1 tablet by mouth every other day.      Marland Kitchen NEXIUM 40 MG capsule Take 1 capsule (40 mg total) by mouth daily before breakfast.  30 capsule  1  . NIFEdipine (PROCARDIA XL/ADALAT-CC) 60 MG 24 hr tablet Take 1 tablet (60 mg total) by mouth daily.  90 tablet  3   . scopolamine (TRANSDERM-SCOP) 1.5 MG Place 1 patch (1.5 mg total) onto the skin every 3 (three) days.  4 patch  0  . terazosin (HYTRIN) 5 MG capsule Take 1 capsule (5 mg total) by mouth at bedtime.  30 capsule  3   No current facility-administered medications for this visit.     REVIEW OF SYSTEMS:   Constitutional: Denies fevers, chills or night sweats All other systems were reviewed with the patient and are negative.  PHYSICAL EXAMINATION: ECOG PERFORMANCE STATUS: 0 - Asymptomatic  Filed Vitals:   06/03/13 1140  BP: 167/83  Pulse: 80  Temp: 98.1 F (36.7 C)  Resp: 18   Filed Weights   06/03/13 1140  Weight: 210 lb 12.8 oz (95.618 kg)    GENERAL:alert, no distress and comfortable NEURO: alert & oriented x 3 with fluent speech, no focal motor/sensory deficits  LABORATORY DATA:  I have reviewed the data as listed No results found for this or any previous visit (from the past 48 hour(s)).  Lab Results  Component Value Date   WBC 4.1 05/13/2013   HGB 12.4 05/13/2013   HCT 38.3 05/13/2013   MCV 88.9 05/13/2013   PLT 202 05/13/2013   A review all her blood test with her. Blood tests were normal. Thyroid function tests were normal. RADIOGRAPHIC STUDIES: I have personally reviewed the radiological images as listed and agreed with the findings in the report. I reviewed the imaging study with her  we show no abnormalities on her CT scan a pack from a large kidney cysts and uterine fibroids ASSESSMENT & PLAN:  #1 recurrent DVT This is likely exacerbated by presence of the IVC filter. Malignancy cannot be ruled out. With constipation issue, I recommend GI evaluation and repeat colonoscopy to rule out cancer. I told her CT scan cannot rule out colon cancer. I recommend she continue on current anticoagulation therapy indefinitely unless she developed bleeding complication. She needs to have followup blood work with her primary care provider with monitoring of her blood counts as well  as kidney function test minimum twice a year. The CBC is to rule out anemia, which could be early sign of bleeding. The kidney function test is to make sure that her kidney function remained adequate for clearance of her anticoagulation therapy. #2 chronic constipation Function tests were within normal limits. I recommend colonoscopy. All questions were answered. The patient knows to call the clinic with any problems, questions or concerns. No barriers to learning was detected.  I spent 15 minutes counseling the patient face to face. The total time spent in the appointment was 20 minutes and more than 50% was on counseling.     Eladia Frame, MD 06/03/2013 12:13 PM

## 2013-06-04 NOTE — Telephone Encounter (Signed)
Patient called and LMOM to return her call giving her the next step in her care and she has questions regarding her medications, please advise?

## 2013-06-04 NOTE — Telephone Encounter (Signed)
I had called pt and informed of the info on late 06/03/2013 and told her I would leave her some samples of Nexium. Pt just wanted to know when she could pick them up today. She is aware we are here all day and leave at 5:00 pm.  Nexium 40 mg  #15 given to take one daily.

## 2013-06-07 ENCOUNTER — Encounter (HOSPITAL_COMMUNITY): Payer: Self-pay | Admitting: Emergency Medicine

## 2013-06-07 ENCOUNTER — Emergency Department (HOSPITAL_COMMUNITY): Payer: Medicare Other

## 2013-06-07 ENCOUNTER — Emergency Department (HOSPITAL_COMMUNITY)
Admission: EM | Admit: 2013-06-07 | Discharge: 2013-06-07 | Disposition: A | Discharge status: Home / Self Care | Payer: Medicare Other | Attending: Emergency Medicine | Admitting: Emergency Medicine

## 2013-06-07 DIAGNOSIS — Z8739 Personal history of other diseases of the musculoskeletal system and connective tissue: Secondary | ICD-10-CM | POA: Insufficient documentation

## 2013-06-07 DIAGNOSIS — K5909 Other constipation: Secondary | ICD-10-CM

## 2013-06-07 DIAGNOSIS — K5900 Constipation, unspecified: Secondary | ICD-10-CM | POA: Insufficient documentation

## 2013-06-07 DIAGNOSIS — Z87891 Personal history of nicotine dependence: Secondary | ICD-10-CM | POA: Insufficient documentation

## 2013-06-07 DIAGNOSIS — Z79899 Other long term (current) drug therapy: Secondary | ICD-10-CM | POA: Insufficient documentation

## 2013-06-07 DIAGNOSIS — R141 Gas pain: Secondary | ICD-10-CM | POA: Insufficient documentation

## 2013-06-07 DIAGNOSIS — Z9889 Other specified postprocedural states: Secondary | ICD-10-CM | POA: Insufficient documentation

## 2013-06-07 DIAGNOSIS — R5381 Other malaise: Secondary | ICD-10-CM | POA: Insufficient documentation

## 2013-06-07 DIAGNOSIS — Z862 Personal history of diseases of the blood and blood-forming organs and certain disorders involving the immune mechanism: Secondary | ICD-10-CM | POA: Insufficient documentation

## 2013-06-07 DIAGNOSIS — E785 Hyperlipidemia, unspecified: Secondary | ICD-10-CM | POA: Insufficient documentation

## 2013-06-07 DIAGNOSIS — K219 Gastro-esophageal reflux disease without esophagitis: Secondary | ICD-10-CM | POA: Insufficient documentation

## 2013-06-07 DIAGNOSIS — Z8742 Personal history of other diseases of the female genital tract: Secondary | ICD-10-CM | POA: Insufficient documentation

## 2013-06-07 DIAGNOSIS — Z86718 Personal history of other venous thrombosis and embolism: Secondary | ICD-10-CM | POA: Insufficient documentation

## 2013-06-07 DIAGNOSIS — R142 Eructation: Secondary | ICD-10-CM | POA: Insufficient documentation

## 2013-06-07 DIAGNOSIS — I10 Essential (primary) hypertension: Secondary | ICD-10-CM | POA: Insufficient documentation

## 2013-06-07 DIAGNOSIS — Z7901 Long term (current) use of anticoagulants: Secondary | ICD-10-CM | POA: Insufficient documentation

## 2013-06-07 HISTORY — DX: Other intervertebral disc degeneration, lumbar region: M51.36

## 2013-06-07 HISTORY — DX: Other intervertebral disc degeneration, lumbar region without mention of lumbar back pain or lower extremity pain: M51.369

## 2013-06-07 LAB — CBC WITH DIFFERENTIAL/PLATELET
Basophils Relative: 0 % (ref 0–1)
Eosinophils Absolute: 0.1 10*3/uL (ref 0.0–0.7)
Lymphocytes Relative: 36 % (ref 12–46)
Lymphs Abs: 1.8 10*3/uL (ref 0.7–4.0)
MCH: 29.3 pg (ref 26.0–34.0)
MCHC: 33.1 g/dL (ref 30.0–36.0)
Neutrophils Relative %: 53 % (ref 43–77)
Platelets: 225 10*3/uL (ref 150–400)
RBC: 4.34 MIL/uL (ref 3.87–5.11)
WBC: 5 10*3/uL (ref 4.0–10.5)

## 2013-06-07 LAB — BASIC METABOLIC PANEL
GFR calc Af Amer: 77 mL/min — ABNORMAL LOW (ref >90)
GFR calc non Af Amer: 67 mL/min — ABNORMAL LOW (ref >90)
Glucose, Bld: 109 mg/dL — ABNORMAL HIGH (ref 70–99)
Potassium: 3.4 mEq/L — ABNORMAL LOW (ref 3.5–5.1)
Sodium: 139 mEq/L (ref 135–145)

## 2013-06-07 LAB — URINALYSIS W MICROSCOPIC + REFLEX CULTURE
Bilirubin Urine: NEGATIVE
Glucose, UA: NEGATIVE mg/dL
Nitrite: NEGATIVE
Protein, ur: NEGATIVE mg/dL
Specific Gravity, Urine: 1.015 (ref 1.005–1.030)
pH: 7 (ref 5.0–8.0)

## 2013-06-07 LAB — HEPATIC FUNCTION PANEL
ALT: 10 U/L (ref 0–35)
AST: 13 U/L (ref 0–37)
Albumin: 4.1 g/dL (ref 3.5–5.2)
Total Bilirubin: 0.3 mg/dL (ref 0.3–1.2)
Total Protein: 8.4 g/dL — ABNORMAL HIGH (ref 6.0–8.3)

## 2013-06-07 LAB — LIPASE, BLOOD: Lipase: 27 U/L (ref 11–59)

## 2013-06-07 MED ORDER — POTASSIUM CHLORIDE 20 MEQ/15ML (10%) PO LIQD
40.0000 meq | Freq: Once | ORAL | Status: AC
  Administered 2013-06-07: 40 meq via ORAL
  Filled 2013-06-07: qty 30

## 2013-06-07 NOTE — ED Notes (Signed)
Pt stated "i am full of poop", unsure of last bowel movement. Stated that she has been having constipation for several months. Weak at times, denies any nausea or vomiting. Is being followed by dr.Rourke.

## 2013-06-07 NOTE — ED Notes (Signed)
Pt c/o constipation and left side pain "for several months". Pt is unsure of when her last regular BM was. Pt states "they have me taking all kinds of medicine and I'll I do is poop water".

## 2013-06-07 NOTE — ED Provider Notes (Signed)
CSN: [id number]     Arrival date & time 06/07/13  1549 History   First MD Initiated Contact with Patient 06/07/13 1719     Chief Complaint  Patient presents with  . Constipation    HPI Pt was seen at 1740.  Per pt, c/o gradual onset and persistence of constant "constipation" for the past 4+ months.  Has been associated with mild occasional nausea and feeling "bloated."  States she takes multiple over the counter laxatives for her symptoms and "just pass watery stool, not formed stools." Endorses she has been "eating very well" without vomiting. States she has been evaluated by her Heme/Onc MD, PMD and GI MD for same this past month. GI MD rx linzess during her last office appointment on 05/20/13 and is planning further evaluation with colonoscopy. Denies vomiting, no fevers, no back pain, no rash, no CP/SOB, no black or blood in stools.      GI: Dr. Darrick Penna Past Medical History  Diagnosis Date  . Uterine fibroid   . Blood clotting tendency   . DVT, lower extremity, recurrent     Right sided. First time 2011, another in 2012, 03/2013  . 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 Hyperlipidemia   . Hypertension   . GERD (gastroesophageal reflux disease)     hiatal hernia  . Weight gain 05/13/2013  . Constipation 05/13/2013  . Hiatal hernia   . DDD (degenerative disc disease), lumbar   . Uterine fibroid    Past Surgical History  Procedure Laterality Date  . Colposcopy    . Insertion of vena cava filter  01-17-2011    dr pindr # [phone]   Family History  Problem Relation Age of Onset  . Kidney disease Mother   . Hypertension Mother   . Cancer Father 78    lung cancer  . Colon cancer Neg Hx   . Clotting disorder Neg Hx   . Other Cousin     died fo pulmonary embolus   History  Substance Use Topics  . Smoking status: Former Smoker -- 0.25 packs/day for 20 years    Quit date: 10/17/1997  . Smokeless tobacco: Never Used  . Alcohol Use: Yes     Comment: Socially     Review of Systems ROS: Statement: All systems negative except as marked or noted in the HPI; Constitutional: Negative for fever and chills. +generalized weakness/fatigue. ; ; Eyes: Negative for eye pain, redness and discharge. ; ; ENMT: Negative for ear pain, hoarseness, nasal congestion, sinus pressure and sore throat. ; ; Cardiovascular: Negative for chest pain, palpitations, diaphoresis, dyspnea and peripheral edema. ; ; Respiratory: Negative for cough, wheezing and stridor. ; ; Gastrointestinal: +constipation, bloating, occasional nausea. Negative for vomiting, abdominal pain, blood in stool, hematemesis, jaundice and rectal bleeding. . ; ; Genitourinary: Negative for dysuria, flank pain and hematuria. ; ; Musculoskeletal: Negative for back pain and neck pain. Negative for swelling and trauma.; ; Skin: Negative for pruritus, rash, abrasions, blisters, bruising and skin lesion.; ; Neuro: Negative for headache, lightheadedness and neck stiffness. Negative for altered level of consciousness , altered mental status, extremity weakness, paresthesias, involuntary movement, seizure and syncope.      Allergies  Review of patient's allergies indicates no known allergies.  Home Medications   Current Outpatient Rx  Name  Route  Sig  Dispense  Refill  . apixaban (ELIQUIS) 5 MG TABS tablet   Oral   Take 2 tablets (10 mg total) by mouth  2 (two) times daily.   75 tablet   3     Take 10 mg BID x 1 week then 5 mg BID   . atorvastatin (LIPITOR) 20 MG tablet   Oral   Take 40 mg by mouth daily.         . cholecalciferol (VITAMIN D) 1000 UNITS tablet   Oral   Take 2,000 Units by mouth daily.         . hydrochlorothiazide (HYDRODIURIL) 25 MG tablet   Oral   Take 1 tablet (25 mg total) by mouth daily.   30 tablet   3   . Linaclotide (LINZESS) 290 MCG CAPS capsule      One capsule daily on empty stomach.   30 capsule   5   . losartan (COZAAR) 100 MG tablet      TAKE ONE TABLET BY  MOUTH ONCE DAILY   90 tablet   3   . Multiple Vitamins-Minerals (CENTRUM SILVER PO)   Oral   Take 1 tablet by mouth every other day.         Marland Kitchen NEXIUM 40 MG capsule   Oral   Take 1 capsule (40 mg total) by mouth daily before breakfast.   30 capsule   1     Dispense as written.   Marland Kitchen NIFEdipine (PROCARDIA XL/ADALAT-CC) 60 MG 24 hr tablet   Oral   Take 1 tablet (60 mg total) by mouth daily.   90 tablet   3   . scopolamine (TRANSDERM-SCOP) 1.5 MG   Transdermal   Place 1 patch (1.5 mg total) onto the skin every 3 (three) days.   4 patch   0   . terazosin (HYTRIN) 5 MG capsule   Oral   Take 1 capsule (5 mg total) by mouth at bedtime.   30 capsule   3    BP 166/78  Pulse 116  Temp(Src) 98.2 F (36.8 C) (Oral)  Resp 18  Ht 5\' 7"  (1.702 m)  Wt 207 lb 4 oz (94.008 kg)  BMI 32.45 kg/m2  SpO2 100% Physical Exam 1745: Physical examination:  Nursing notes reviewed; Vital signs and O2 SAT reviewed;  Constitutional: Well developed, Well nourished, Well hydrated, In no acute distress; Head:  Normocephalic, atraumatic; Eyes: EOMI, PERRL, No scleral icterus; ENMT: Mouth and pharynx normal, Mucous membranes moist; Neck: Supple, Full range of motion, No lymphadenopathy; Cardiovascular: Regular rate and rhythm, No gallop; Respiratory: Breath sounds clear & equal bilaterally, No wheezes.  Speaking full sentences with ease, Normal respiratory effort/excursion; Chest: Nontender, Movement normal; Abdomen: Soft, Nontender, Nondistended, Normal bowel sounds; Genitourinary: No CVA tenderness; Extremities: Pulses normal, No tenderness, No edema, No calf edema or asymmetry.; Neuro: AA&Ox3, Major CN grossly intact.  Speech clear. Climbs on and off stretcher easily by herself. Gait steady. No gross focal motor or sensory deficits in extremities.; Skin: Color normal, Warm, Dry.   ED Course  Procedures  EKG Interpretation   None       MDM  MDM Reviewed: previous chart, nursing note and  vitals Reviewed previous: CT scan, labs and x-ray Interpretation: labs and x-ray   Results for orders placed during the hospital encounter of 06/07/13  CBC WITH DIFFERENTIAL      Result Value Range   WBC 5.0  4.0 - 10.5 K/uL   RBC 4.34  3.87 - 5.11 MIL/uL   Hemoglobin 12.7  12.0 - 15.0 g/dL   HCT 81.1  91.4 - 78.2 %   MCV 88.5  78.0 - 100.0 fL   MCH 29.3  26.0 - 34.0 pg   MCHC 33.1  30.0 - 36.0 g/dL   RDW 62.1  30.8 - 65.7 %   Platelets 225  150 - 400 K/uL   Neutrophils Relative % 53  43 - 77 %   Neutro Abs 2.6  1.7 - 7.7 K/uL   Lymphocytes Relative 36  12 - 46 %   Lymphs Abs 1.8  0.7 - 4.0 K/uL   Monocytes Relative 9  3 - 12 %   Monocytes Absolute 0.4  0.1 - 1.0 K/uL   Eosinophils Relative 2  0 - 5 %   Eosinophils Absolute 0.1  0.0 - 0.7 K/uL   Basophils Relative 0  0 - 1 %   Basophils Absolute 0.0  0.0 - 0.1 K/uL  BASIC METABOLIC PANEL      Result Value Range   Sodium 139  135 - 145 mEq/L   Potassium 3.4 (*) 3.5 - 5.1 mEq/L   Chloride 99  96 - 112 mEq/L   CO2 29  19 - 32 mEq/L   Glucose, Bld 109 (*) 70 - 99 mg/dL   BUN 11  6 - 23 mg/dL   Creatinine, Ser 8.46  0.50 - 1.10 mg/dL   Calcium 96.2  8.4 - 95.2 mg/dL   GFR calc non Af Amer 67 (*) >90 mL/min   GFR calc Af Amer 77 (*) >90 mL/min  HEPATIC FUNCTION PANEL      Result Value Range   Total Protein 8.4 (*) 6.0 - 8.3 g/dL   Albumin 4.1  3.5 - 5.2 g/dL   AST 13  0 - 37 U/L   ALT 10  0 - 35 U/L   Alkaline Phosphatase 83  39 - 117 U/L   Total Bilirubin 0.3  0.3 - 1.2 mg/dL   Bilirubin, Direct 0.1  0.0 - 0.3 mg/dL   Indirect Bilirubin 0.2 (*) 0.3 - 0.9 mg/dL  LIPASE, BLOOD      Result Value Range   Lipase 27  11 - 59 U/L  URINALYSIS W MICROSCOPIC + REFLEX CULTURE      Result Value Range   Color, Urine YELLOW  YELLOW   APPearance CLEAR  CLEAR   Specific Gravity, Urine 1.015  1.005 - 1.030   pH 7.0  5.0 - 8.0   Glucose, UA NEGATIVE  NEGATIVE mg/dL   Hgb urine dipstick NEGATIVE  NEGATIVE   Bilirubin Urine  NEGATIVE  NEGATIVE   Ketones, ur NEGATIVE  NEGATIVE mg/dL   Protein, ur NEGATIVE  NEGATIVE mg/dL   Urobilinogen, UA 0.2  0.0 - 1.0 mg/dL   Nitrite NEGATIVE  NEGATIVE   Leukocytes, UA TRACE (*) NEGATIVE   WBC, UA 0-2  <3 WBC/hpf   RBC / HPF 0-2  <3 RBC/hpf   Squamous Epithelial / LPF RARE  RARE   Ct Abdomen Pelvis W Contrast 05/29/2013   CLINICAL DATA:  Constipation.  EXAM: CT ABDOMEN AND PELVIS WITH CONTRAST  TECHNIQUE: Multidetector CT imaging of the abdomen and pelvis was performed using the standard protocol following bolus administration of intravenous contrast.  CONTRAST:  50mL OMNIPAQUE IOHEXOL 300 MG/ML SOLN, OMNIPAQUE IOHEXOL 300 MG/ML SOLN  COMPARISON:  None.  FINDINGS: No pleural effusion identified.  The lung bases appear clear.  No focal liver abnormality identified. The gallbladder is normal. No biliary dilatation. Normal appearance of the pancreas. The spleen is on unremarkable.  The adrenal glands both appear normal. Within the right  kidney there is a 6.5 cm Bosniak category 1 cyst, image 36/series 2. The left kidney is on unremarkable. The urinary bladder is normal. Fibroid uterus is identified. This measures 7.6 x 7.1 x 7.9 cm.  There is calcified atherosclerotic disease affecting the abdominal aorta. No aneurysm. There is no upper abdominal adenopathy identified. Borderline enlarged bilateral external iliac lymph nodes measure up to 1 cm, image 67/series 2.  No free fluid or abnormal fluid collections identified within the abdomen or pelvis. Filter is noted within the IVC. There is a small hiatal hernia. The stomach is otherwise unremarkable. The small bowel loops are within normal limits. Normal appearance of the colon  Review of the visualized osseous structures is significant for mild lumbar degenerative disc disease.  IMPRESSION: 1. No acute findings within the abdomen or pelvis. No specific features to suggest malignancy identified. 2. Simple cyst is identified within the  right kidney. 3. Fibroid uterus. 4. Calcified atherosclerotic disease. 5. Bilateral external iliac lymph nodes are borderline enlarged measuring 1 cm.   Electronically Signed   By: Signa Kell M.D.   On: 05/29/2013 14:07   Dg Abd Acute W/chest 06/07/2013   CLINICAL DATA:  Constipation  EXAM: ACUTE ABDOMEN SERIES (ABDOMEN 2 VIEW & CHEST 1 VIEW)  COMPARISON:  Chest radiograph 05/29/2013. CT abdomen pelvis 05/29/2013  FINDINGS: There is no evidence of dilated bowel loops or free intraperitoneal air. No significant stool burden. Calcifications in the patient's pelvis, in the midline and to the right of midline, are consistent with calcified uterine fibroids is seen on recent CT abdomen and pelvis. No radiopaque calculi or other significant radiographic abnormality is seen. Heart size and mediastinal contours are within normal limits. Both lungs are clear. An IVC filter is seen to the right of midline at L3-L4.  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 Mild thoracolumbar scoliosis appears stable.  IMPRESSION: 1. Nonobstructive bowel gas pattern.  No significant stool burden. 2. No acute cardiopulmonary disease.   Electronically Signed   By: Britta Mccreedy M.D.   On: 06/07/2013 19:22    2000:  Potassium repleted PO. Has tol PO well without N/V. No stooling while in the ED. Workup today reassuring; long d/w pt regarding same. Pt wants to go home now. Dx and testing d/w pt and family.  Questions answered.  Verb understanding, agreeable to d/c home with outpt f/u.      Laray Anger, DO 06/10/13 1453

## 2013-07-16 ENCOUNTER — Telehealth: Payer: Self-pay | Admitting: Family Medicine

## 2013-07-16 NOTE — Telephone Encounter (Signed)
They are all very expensive. Appointment with Lynelle Smoke or Sharyn Lull in the next 1 to 2 days to start on coumadin if she wants to switch. It would entail monthly visits when stable and risks as well. But will need to discuss with clinical pharmacist.

## 2013-07-17 ENCOUNTER — Telehealth: Payer: Self-pay | Admitting: Pharmacist

## 2013-07-17 NOTE — Telephone Encounter (Signed)
Spoke with pt and she verbalizes she is on elequis and wants to change to less expensive med, advised per dr Jacelyn Grip to sch appt with pharmacist and explained the need for monthly visits . Pt verbalized understanding and still wants to  Change med. Call transferred to scheduler for appt in 1-2 days .

## 2013-07-17 NOTE — Telephone Encounter (Signed)
Ok to make appt any time - need 20 to 30 minutes

## 2013-07-21 ENCOUNTER — Ambulatory Visit (INDEPENDENT_AMBULATORY_CARE_PROVIDER_SITE_OTHER): Payer: Medicare Other | Admitting: Pharmacist

## 2013-07-21 DIAGNOSIS — I82409 Acute embolism and thrombosis of unspecified deep veins of unspecified lower extremity: Secondary | ICD-10-CM

## 2013-07-21 DIAGNOSIS — I82401 Acute embolism and thrombosis of unspecified deep veins of right lower extremity: Secondary | ICD-10-CM

## 2013-07-21 LAB — POCT INR: INR: 1.1

## 2013-07-21 MED ORDER — WARFARIN SODIUM 5 MG PO TABS: 5.0000 mg | ORAL_TABLET | Freq: Every day | ORAL | Status: DC

## 2013-07-21 NOTE — Progress Notes (Signed)
Patient has been taking Eliquis 5mg  bid (initially started at 10mg  bid for 1 month) since September due to recurrent DVT's. Wishing to change to warfarin due to cost of other anticoagulants. She wants to know why she is having recurrent DVT's. She was seen by hematologist but I did not see any testing for genetic anticoagulation disorders.  Assessment: Recurrent DVT  Plan: Restart warfarin 5mg  daily Labs ordered to r/o clotting disorder  Orders Placed This Encounter  Procedures  . Lupus anticoagulant  . Protein S, Antigen, Free  . Factor V Activity  . Antiphospholipid syndrome eval, bld  . Protein C, total and functional panel  . Ambulatory referral to Anticoagulation Monitoring    Referral Priority:  Routine    Referral Type:  Consultation    Referral Reason:  Specialty Services Required    Referred to Provider:  Cherre Robins, PHARMD    Number of Visits Requested:  [place], PharmD, CPP

## 2013-07-26 LAB — PROTEIN C DEFICIENCY PROFILE
Protein C Activity: 191 % — ABNORMAL HIGH (ref 74–151)
Protein C Antigen: 129 % (ref 70–140)

## 2013-07-26 LAB — SPECIMEN STATUS REPORT

## 2013-07-28 ENCOUNTER — Ambulatory Visit (INDEPENDENT_AMBULATORY_CARE_PROVIDER_SITE_OTHER): Payer: Medicare Other | Admitting: Pharmacist

## 2013-07-28 DIAGNOSIS — D7589 Other specified diseases of blood and blood-forming organs: Secondary | ICD-10-CM

## 2013-07-28 DIAGNOSIS — I82409 Acute embolism and thrombosis of unspecified deep veins of unspecified lower extremity: Secondary | ICD-10-CM

## 2013-07-28 DIAGNOSIS — D689 Coagulation defect, unspecified: Secondary | ICD-10-CM

## 2013-07-28 LAB — POCT INR: INR: 1.8

## 2013-07-28 NOTE — Patient Instructions (Signed)
Anticoagulation Dose Instructions as of 07/28/2013     Dorene Grebe Tue Wed Thu Fri Sat   New Dose 5 mg 5 mg 5 mg 5 mg 5 mg 5 mg 5 mg    Description       Continue warfarin 5mg  daily      INR was 1.8 today

## 2013-08-06 LAB — LUPUS ANTICOAGULANT
Dilute Viper Venom Time: 33.9 s (ref 0.0–55.1)
PTT LA: 36.2 s (ref 0.0–50.0)
THROMBIN TIME: 17.6 s (ref 0.0–20.0)
dPT Confirm Ratio: 0.96 Ratio (ref 0.00–1.20)
dPT: 32.9 s (ref 0.0–55.0)

## 2013-08-06 LAB — SPECIMEN STATUS REPORT

## 2013-08-06 LAB — ANTIPHOSPHOLIPID SYNDROME EVAL, BLD
ANTICARDIOLIPIN IGG: 10 GPL U/mL (ref 0–14)
APTT PPP: 30.8 s (ref 23.4–36.4)
Anticardiolipin IgM: <9 MPL U/mL (ref 0–12)
Dilute Viper Venom Time: 36.5 s (ref 0.0–55.1)
Hexagonal Phase Phospholipid: 2.2 s (ref 0.0–8.0)
INR: 1 (ref 0.8–1.2)
PT: 13.2 s (ref 11.9–14.1)
Thrombin Time: 17.5 s (ref 0.0–20.0)

## 2013-08-06 LAB — FACTOR V ACTIVITY: Factor V Activity: 107 % (ref 60–140)

## 2013-08-06 LAB — COAG STUDIES INTERP REPORT: PDF IMAGE: 0

## 2013-08-06 LAB — PROTEIN S, ANTIGEN, FREE: Protein S Ag, Free: 118 % (ref 56–124)

## 2013-08-19 ENCOUNTER — Other Ambulatory Visit: Payer: Self-pay | Admitting: Family Medicine

## 2013-08-20 NOTE — Telephone Encounter (Signed)
Patient needs to be seen. Has exceeded time since last visit. Limited quantity refilled. Needs to bring all medications to next appointment.   

## 2013-08-25 ENCOUNTER — Ambulatory Visit (INDEPENDENT_AMBULATORY_CARE_PROVIDER_SITE_OTHER): Payer: Medicare Other | Admitting: Pharmacist

## 2013-08-25 DIAGNOSIS — I82409 Acute embolism and thrombosis of unspecified deep veins of unspecified lower extremity: Secondary | ICD-10-CM

## 2013-08-25 DIAGNOSIS — D689 Coagulation defect, unspecified: Secondary | ICD-10-CM

## 2013-08-25 DIAGNOSIS — D7589 Other specified diseases of blood and blood-forming organs: Secondary | ICD-10-CM

## 2013-08-25 LAB — POCT INR: INR: 2.2

## 2013-08-25 NOTE — Patient Instructions (Signed)
Anticoagulation Dose Instructions as of 08/25/2013     Dorene Grebe Tue Wed Thu Fri Sat   New Dose 5 mg 5 mg 5 mg 5 mg 5 mg 5 mg 5 mg    Description       Continue warfarin 5mg  daily      INR was 2.2 today (goal was 2.0 to 3.0)

## 2013-09-19 ENCOUNTER — Other Ambulatory Visit: Payer: Self-pay | Admitting: Pharmacist

## 2013-09-22 ENCOUNTER — Ambulatory Visit (INDEPENDENT_AMBULATORY_CARE_PROVIDER_SITE_OTHER): Payer: Medicare Other | Admitting: Pharmacist

## 2013-09-22 DIAGNOSIS — D7589 Other specified diseases of blood and blood-forming organs: Secondary | ICD-10-CM

## 2013-09-22 DIAGNOSIS — D689 Coagulation defect, unspecified: Secondary | ICD-10-CM

## 2013-09-22 DIAGNOSIS — I82409 Acute embolism and thrombosis of unspecified deep veins of unspecified lower extremity: Secondary | ICD-10-CM

## 2013-09-22 LAB — POCT INR: INR: 1.3

## 2013-09-22 MED ORDER — WARFARIN SODIUM 5 MG PO TABS: 5.0000 mg | ORAL_TABLET | Freq: Every day | ORAL | Status: DC

## 2013-09-22 NOTE — Patient Instructions (Signed)
Anticoagulation Dose Instructions as of 09/22/2013     Renee Pratt Tue Wed Thu Fri Sat   New Dose 5 mg 5 mg 5 mg 5 mg 5 mg 5 mg 5 mg    Description       Take 2 tablets today and tomorrow, then continue warfarin 5mg  daily      INR was 1.3 todday

## 2013-09-23 ENCOUNTER — Other Ambulatory Visit: Payer: Self-pay | Admitting: Family Medicine

## 2013-09-24 NOTE — Telephone Encounter (Signed)
Call patient : Prescription refilled & sent to pharmacy in [place]. However she has not been seen for a long time. She needs to establish with a new PCP soon and be seen for follow up of hermedical problems and have appropriate labs done.

## 2013-10-07 ENCOUNTER — Ambulatory Visit: Payer: Self-pay | Admitting: Family Medicine

## 2013-10-09 ENCOUNTER — Ambulatory Visit (INDEPENDENT_AMBULATORY_CARE_PROVIDER_SITE_OTHER): Payer: Medicare Other | Admitting: Family Medicine

## 2013-10-09 ENCOUNTER — Encounter: Payer: Self-pay | Admitting: Family Medicine

## 2013-10-09 ENCOUNTER — Ambulatory Visit (INDEPENDENT_AMBULATORY_CARE_PROVIDER_SITE_OTHER): Payer: Medicare Other | Admitting: Pharmacist

## 2013-10-09 VITALS — BP 136/79 | HR 98 | Temp 97.8°F | Ht 67.0 in | Wt 212.2 lb

## 2013-10-09 DIAGNOSIS — E669 Obesity, unspecified: Secondary | ICD-10-CM

## 2013-10-09 DIAGNOSIS — I82409 Acute embolism and thrombosis of unspecified deep veins of unspecified lower extremity: Secondary | ICD-10-CM

## 2013-10-09 DIAGNOSIS — K219 Gastro-esophageal reflux disease without esophagitis: Secondary | ICD-10-CM

## 2013-10-09 DIAGNOSIS — E785 Hyperlipidemia, unspecified: Secondary | ICD-10-CM

## 2013-10-09 DIAGNOSIS — Z95828 Presence of other vascular implants and grafts: Secondary | ICD-10-CM

## 2013-10-09 DIAGNOSIS — Z9889 Other specified postprocedural states: Secondary | ICD-10-CM

## 2013-10-09 DIAGNOSIS — I10 Essential (primary) hypertension: Secondary | ICD-10-CM

## 2013-10-09 DIAGNOSIS — D689 Coagulation defect, unspecified: Secondary | ICD-10-CM

## 2013-10-09 DIAGNOSIS — D7589 Other specified diseases of blood and blood-forming organs: Secondary | ICD-10-CM

## 2013-10-09 DIAGNOSIS — E559 Vitamin D deficiency, unspecified: Secondary | ICD-10-CM

## 2013-10-09 LAB — POCT INR: INR: 1.8

## 2013-10-09 MED ORDER — ATORVASTATIN CALCIUM 20 MG PO TABS: 20.0000 mg | ORAL_TABLET | Freq: Every day | ORAL | Status: DC

## 2013-10-09 NOTE — Progress Notes (Signed)
Patient ID: Renee Pratt, female   DOB: 1946-01-03, 68 y.o.   MRN: [id number] SUBJECTIVE: CC: Chief Complaint  Patient presents with  . Hypertension    chronic health recheck  . Gastrophageal Reflux  . Hyperlipidemia    HPI: Patient is here for follow up of hyperlipidemia/HTN/GERD denies Headache;denies Chest Pain;denies weakness;denies Shortness of Breath and orthopnea;denies Visual changes;denies palpitations;denies cough;denies pedal edema;denies symptoms of TIA or stroke;deniesClaudication symptoms. admits to Compliance with medications; denies Problems with medications.   Not satisfied or fullfilled life her and plans to move back to [place].  Has her colonoscopy being planned in [place] in the near future.  Past Medical History  Diagnosis Date  . Uterine fibroid   . Blood clotting tendency   . DVT, lower extremity, recurrent     Right sided. First time 2011, another in 2012, 03/2013  . 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 Hyperlipidemia   . Hypertension   . GERD (gastroesophageal reflux disease)     hiatal hernia  . Weight gain 05/13/2013  . Constipation 05/13/2013  . Hiatal hernia   . DDD (degenerative disc disease), lumbar   . Uterine fibroid    Past Surgical History  Procedure Laterality Date  . Colposcopy    . Insertion of vena cava filter  01-17-2011    dr pindr # [phone]   History   Social History  . Marital Status: Divorced    Spouse Name: N/A    Number of Children: 3  . Years of Education: N/A   Occupational History  . Not on file.   Social History Main Topics  . Smoking status: Former Smoker -- 0.25 packs/day for 20 years    Quit date: 10/17/1997  . Smokeless tobacco: Never Used  . Alcohol Use: Yes     Comment: Socially  . Drug Use: No  . Sexual Activity: No   Other Topics Concern  . Not on file   Social History Narrative  . No narrative on file   Family History  Problem Relation Age of Onset  . Kidney disease Mother   .  Hypertension Mother   . Cancer Father 24    lung cancer  . Colon cancer Neg Hx   . Clotting disorder Neg Hx   . Other Cousin     died fo pulmonary embolus   Current Outpatient Prescriptions on File Prior to Visit  Medication Sig Dispense Refill  . cholecalciferol (VITAMIN D) 1000 UNITS tablet Take 2,000 Units by mouth daily.      . hydrochlorothiazide (HYDRODIURIL) 25 MG tablet Take 1 tablet (25 mg total) by mouth daily.  30 tablet  3  . losartan (COZAAR) 100 MG tablet Take 100 mg by mouth daily.      . Multiple Vitamins-Minerals (CENTRUM SILVER PO) Take 1 tablet by mouth every other day.      Marland Kitchen NIFEdipine (PROCARDIA XL/ADALAT-CC) 60 MG 24 hr tablet Take 1 tablet (60 mg total) by mouth daily.  90 tablet  3  . terazosin (HYTRIN) 5 MG capsule TAKE ONE CAPSULE BY MOUTH AT BEDTIME  30 capsule  0  . warfarin (COUMADIN) 5 MG tablet Take 1 tablet (5 mg total) by mouth daily.  30 tablet  2  . Linaclotide (LINZESS) 290 MCG CAPS capsule One capsule daily on empty stomach.  30 capsule  5  . NEXIUM 40 MG capsule Take 1 capsule (40 mg total) by mouth daily before breakfast.  30 capsule  1   No current facility-administered medications on file prior to visit.   No Known Allergies Immunization History  Administered Date(s) Administered  . Influenza,inj,Quad PF,36+ Mos 05/08/2013  . Pneumococcal Polysaccharide-23 09/11/2012  . Tdap 07/10/2002  . Zoster 07/11/2007   Prior to Admission medications   Medication Sig Start Date End Date Taking? Authorizing Provider  cholecalciferol (VITAMIN D) 1000 UNITS tablet Take 2,000 Units by mouth daily.   Yes Historical Provider, MD  hydrochlorothiazide (HYDRODIURIL) 25 MG tablet Take 1 tablet (25 mg total) by mouth daily. 03/27/13  Yes Vernie Shanks, MD  losartan (COZAAR) 100 MG tablet Take 100 mg by mouth daily.   Yes Historical Provider, MD  Multiple Vitamins-Minerals (CENTRUM SILVER PO) Take 1 tablet by mouth every other day.   Yes Historical Provider, MD   NIFEdipine (PROCARDIA XL/ADALAT-CC) 60 MG 24 hr tablet Take 1 tablet (60 mg total) by mouth daily. 03/27/13  Yes Vernie Shanks, MD  terazosin (HYTRIN) 5 MG capsule TAKE ONE CAPSULE BY MOUTH AT BEDTIME   Yes Vernie Shanks, MD  warfarin (COUMADIN) 5 MG tablet Take 1 tablet (5 mg total) by mouth daily. 09/22/13  Yes Tammy Eckard, PHARMD  Linaclotide (LINZESS) 290 MCG CAPS capsule One capsule daily on empty stomach. 05/20/13   Orvil Feil, NP  NEXIUM 40 MG capsule Take 1 capsule (40 mg total) by mouth daily before breakfast. 05/20/13   Mahala Menghini, PA-C     ROS: As above in the HPI. All other systems are stable or negative.  OBJECTIVE: APPEARANCE:  Patient in no acute distress.The patient appeared well nourished and normally developed. Acyanotic. Waist: VITAL SIGNS:BP 136/79  Pulse 98  Temp(Src) 97.8 F (36.6 C) (Oral)  Ht _0  (1.702 m)  Wt 212 lb 3.2 oz (96.253 kg)  BMI 33.23 kg/m2  AAF  SKIN: warm and  Dry without overt rashes, tattoos. Stasis dermatoitis hyperpigmentation on the right medial leg.  HEAD and Neck: without JVD, Head and scalp: normal Eyes:No scleral icterus. Fundi normal, eye movements normal. Ears: Auricle normal, canal normal, Tympanic membranes normal, insufflation normal. Nose: normal Throat: normal Neck & thyroid: normal  CHEST & LUNGS: Chest wall: normal Lungs: Clear  CVS: Reveals the PMI to be normally located. Regular rhythm, First and Second Heart sounds are normal,  absence of murmurs, rubs or gallops. Peripheral vasculature: Radial pulses: normal Dorsal pedis pulses: normal Posterior pulses: normal  ABDOMEN:  Appearance: obese Benign, no organomegaly, no masses, no Abdominal Aortic enlargement. No Guarding , no rebound. No Bruits. Bowel sounds: normal  RECTAL: N/A GU: N/A  EXTREMETIES: 1+ edematous.  MUSCULOSKELETAL:  Spine: normal Joints: intact  NEUROLOGIC: oriented to time,place and person; nonfocal. Strength is  normal Sensory is normal Reflexes are normal Cranial Nerves are normal.  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  Orders Placed This Encounter  Procedures  . CMP14+EGFR  . NMR, lipoprofile  . Vit D  25 hydroxy (rtn osteoporosis monitoring)  . POCT INR   Meds ordered this encounter  Medications  . atorvastatin (LIPITOR) 20 MG tablet    Sig: Take 1  tablet (20 mg total) by mouth daily.    Dispense:  30 tablet    Refill:  5   There are no discontinued medications. Return in about 4 weeks (around 11/06/2013) for recheck protime,with Tammy.  Khalel Alms P. Jacelyn Grip, M.D.

## 2013-10-09 NOTE — Patient Instructions (Signed)
DASH Diet  The DASH diet stands for "Dietary Approaches to Stop Hypertension." It is a healthy eating plan that has been shown to reduce high blood pressure (hypertension) in as little as 14 days, while also possibly providing other significant health benefits. These other health benefits include reducing the risk of breast cancer after menopause and reducing the risk of type 2 diabetes, heart disease, colon cancer, and stroke. Health benefits also include weight loss and slowing kidney failure in patients with chronic kidney disease.   DIET GUIDELINES  · Limit salt (sodium). Your diet should contain less than 1500 mg of sodium daily.  · Limit refined or processed carbohydrates. Your diet should include mostly whole grains. Desserts and added sugars should be used sparingly.  · Include small amounts of heart-healthy fats. These types of fats include nuts, oils, and tub margarine. Limit saturated and trans fats. These fats have been shown to be harmful in the body.  CHOOSING FOODS   The following food groups are based on a 2000 calorie diet. See your Registered Dietitian for individual calorie needs.  Grains and Grain Products (6 to 8 servings daily)  · Eat More Often: Whole-wheat bread, brown rice, whole-grain or wheat pasta, quinoa, popcorn without added fat or salt (air popped).  · Eat Less Often: White bread, white pasta, white rice, cornbread.  Vegetables (4 to 5 servings daily)  · Eat More Often: Fresh, frozen, and canned vegetables. Vegetables may be raw, steamed, roasted, or grilled with a minimal amount of fat.  · Eat Less Often/Avoid: Creamed or fried vegetables. Vegetables in a cheese sauce.  Fruit (4 to 5 servings daily)  · Eat More Often: All fresh, canned (in natural juice), or frozen fruits. Dried fruits without added sugar. One hundred percent fruit juice (½ cup [237 mL] daily).  · Eat Less Often: Dried fruits with added sugar. Canned fruit in light or heavy syrup.  Lean Meats, Fish, and Poultry (2  servings or less daily. One serving is 3 to 4 oz [85-114 g]).  · Eat More Often: Ninety percent or leaner ground beef, tenderloin, sirloin. Round cuts of beef, chicken breast, turkey breast. All fish. Grill, bake, or broil your meat. Nothing should be fried.  · Eat Less Often/Avoid: Fatty cuts of meat, turkey, or chicken leg, thigh, or wing. Fried cuts of meat or fish.  Dairy (2 to 3 servings)  · Eat More Often: Low-fat or fat-free milk, low-fat plain or light yogurt, reduced-fat or part-skim cheese.  · Eat Less Often/Avoid: Milk (whole, 2%). Whole milk yogurt. Full-fat cheeses.  Nuts, Seeds, and Legumes (4 to 5 servings per week)  · Eat More Often: All without added salt.  · Eat Less Often/Avoid: Salted nuts and seeds, canned beans with added salt.  Fats and Sweets (limited)  · Eat More Often: Vegetable oils, tub margarines without trans fats, sugar-free gelatin. Mayonnaise and salad dressings.  · Eat Less Often/Avoid: Coconut oils, palm oils, butter, stick margarine, cream, half and half, cookies, candy, pie.  FOR MORE INFORMATION  The Dash Diet Eating Plan: www.dashdiet.org  Document Released: 06/15/2011 Document Revised: 09/18/2011 Document Reviewed: 06/15/2011  [employer]® Patient Information ©2014 [employer].

## 2013-10-11 LAB — NMR, LIPOPROFILE
Cholesterol: 319 mg/dL — ABNORMAL HIGH (ref <200)
HDL Cholesterol by NMR: 56 mg/dL (ref >=40)
HDL Particle Number: 35.1 umol/L (ref >=30.5)
LDL Particle Number: 3033 nmol/L — ABNORMAL HIGH (ref <1000)
LDL Size: 21.5 nm (ref >20.5)
LDLC SERPL CALC-MCNC: 235 mg/dL — ABNORMAL HIGH (ref <100)
LP-IR Score: 79 — ABNORMAL HIGH (ref <=45)
Small LDL Particle Number: 1081 nmol/L — ABNORMAL HIGH (ref <=527)
Triglycerides by NMR: 139 mg/dL (ref <150)

## 2013-10-11 LAB — CMP14+EGFR
ALT: 11 IU/L (ref 0–32)
AST: 16 IU/L (ref 0–40)
Albumin/Globulin Ratio: 1.8 (ref 1.1–2.5)
Albumin: 4.6 g/dL (ref 3.6–4.8)
Alkaline Phosphatase: 79 IU/L (ref 39–117)
BUN/Creatinine Ratio: 15 (ref 11–26)
BUN: 13 mg/dL (ref 8–27)
CO2: 24 mmol/L (ref 18–29)
Calcium: 9.6 mg/dL (ref 8.7–10.3)
Chloride: 99 mmol/L (ref 97–108)
Creatinine, Ser: 0.86 mg/dL (ref 0.57–1.00)
GFR calc Af Amer: 81 mL/min/{1.73_m2} (ref >59)
GFR calc non Af Amer: 71 mL/min/{1.73_m2} (ref >59)
Globulin, Total: 2.6 g/dL (ref 1.5–4.5)
Glucose: 97 mg/dL (ref 65–99)
Potassium: 3.5 mmol/L (ref 3.5–5.2)
Sodium: 142 mmol/L (ref 134–144)
Total Bilirubin: 0.4 mg/dL (ref 0.0–1.2)
Total Protein: 7.2 g/dL (ref 6.0–8.5)

## 2013-10-11 LAB — VITAMIN D 25 HYDROXY (VIT D DEFICIENCY, FRACTURES): Vit D, 25-Hydroxy: 41.4 ng/mL (ref 30.0–100.0)

## 2013-10-16 ENCOUNTER — Other Ambulatory Visit: Payer: Self-pay | Admitting: Family Medicine

## 2013-10-27 ENCOUNTER — Other Ambulatory Visit: Payer: Self-pay | Admitting: Family Medicine

## 2013-11-07 ENCOUNTER — Ambulatory Visit: Payer: Medicare Other | Admitting: Family Medicine

## 2013-11-20 ENCOUNTER — Ambulatory Visit (INDEPENDENT_AMBULATORY_CARE_PROVIDER_SITE_OTHER): Payer: Medicare Other | Admitting: Pharmacist

## 2013-11-20 DIAGNOSIS — D7589 Other specified diseases of blood and blood-forming organs: Secondary | ICD-10-CM

## 2013-11-20 DIAGNOSIS — I82409 Acute embolism and thrombosis of unspecified deep veins of unspecified lower extremity: Secondary | ICD-10-CM

## 2013-11-20 DIAGNOSIS — D689 Coagulation defect, unspecified: Secondary | ICD-10-CM

## 2013-11-20 LAB — POCT INR: INR: 2.8

## 2013-11-20 NOTE — Patient Instructions (Signed)
Anticoagulation Dose Instructions as of 11/20/2013     Renee Pratt Tue Wed Thu Fri Sat   New Dose 5 mg 5 mg 5 mg 5 mg 5 mg 5 mg 5 mg    Description       Continue  5mg  daily - 1 tablet a day.      INR was 2.8 today

## 2013-12-13 ENCOUNTER — Other Ambulatory Visit: Payer: Self-pay | Admitting: Family Medicine

## 2013-12-18 ENCOUNTER — Encounter: Payer: Self-pay | Admitting: Family

## 2013-12-18 ENCOUNTER — Ambulatory Visit (INDEPENDENT_AMBULATORY_CARE_PROVIDER_SITE_OTHER): Payer: Medicare Other | Admitting: Family

## 2013-12-18 VITALS — BP 136/85 | HR 90 | Temp 98.7°F | Ht 67.0 in | Wt 211.0 lb

## 2013-12-18 DIAGNOSIS — E785 Hyperlipidemia, unspecified: Secondary | ICD-10-CM

## 2013-12-18 DIAGNOSIS — R5381 Other malaise: Secondary | ICD-10-CM

## 2013-12-18 DIAGNOSIS — E559 Vitamin D deficiency, unspecified: Secondary | ICD-10-CM

## 2013-12-18 DIAGNOSIS — K5900 Constipation, unspecified: Secondary | ICD-10-CM

## 2013-12-18 DIAGNOSIS — K219 Gastro-esophageal reflux disease without esophagitis: Secondary | ICD-10-CM

## 2013-12-18 DIAGNOSIS — R5383 Other fatigue: Secondary | ICD-10-CM

## 2013-12-18 DIAGNOSIS — I82409 Acute embolism and thrombosis of unspecified deep veins of unspecified lower extremity: Secondary | ICD-10-CM

## 2013-12-18 DIAGNOSIS — I10 Essential (primary) hypertension: Secondary | ICD-10-CM

## 2013-12-18 MED ORDER — LOSARTAN POTASSIUM 100 MG PO TABS: 100.0000 mg | ORAL_TABLET | Freq: Every day | ORAL | Status: DC

## 2013-12-18 MED ORDER — NIFEDIPINE ER OSMOTIC RELEASE 60 MG PO TB24: 60.0000 mg | ORAL_TABLET | Freq: Every day | ORAL | Status: DC

## 2013-12-18 NOTE — Progress Notes (Signed)
Subjective:    Patient ID: Renee Pratt, female    DOB: 1945/12/14, 68 y.o.   MRN: [id number]  Hyperlipidemia This is a chronic problem. The current episode started more than 1 year ago. The problem is uncontrolled. Recent lipid tests were reviewed and are high. Exacerbating diseases include obesity. She has no history of diabetes or hypothyroidism. Factors aggravating her hyperlipidemia include fatty foods. Pertinent negatives include no chest pain, leg pain, myalgias or shortness of breath. Current antihyperlipidemic treatment includes statins. The current treatment provides significant improvement of lipids. Risk factors for coronary artery disease include dyslipidemia, hypertension, obesity and post-menopausal.  Hypertension This is a chronic problem. The current episode started more than 1 year ago. The problem has been resolved since onset. The problem is controlled. Associated symptoms include peripheral edema. Pertinent negatives include no anxiety, chest pain, headaches, palpitations or shortness of breath. Risk factors for coronary artery disease include dyslipidemia, obesity and post-menopausal state. Past treatments include angiotensin blockers and diuretics. The current treatment provides moderate improvement. There is no history of kidney disease, CAD/MI or a thyroid problem. There is no history of sleep apnea.  Gastrophageal Reflux She reports no chest pain, no coughing, no heartburn or no stridor. This is a chronic problem. The current episode started more than 1 year ago. The problem occurs occasionally. The problem has been resolved. The symptoms are aggravated by certain foods. Risk factors include obesity. She has tried a PPI (Pt does not take this regularly) for the symptoms.  Constipation This is a chronic problem. The current episode started more than 1 year ago. The problem has been waxing and waning since onset. Her stool frequency is 4 to 5 times per week. The stool is  described as firm. The patient is not on a high fiber diet. She exercises regularly. She has tried stool softeners for the symptoms. The treatment provided significant relief.      Review of Systems  HENT: Negative.   Respiratory: Negative.  Negative for cough and shortness of breath.   Cardiovascular: Negative.  Negative for chest pain and palpitations.  Gastrointestinal: Positive for constipation. Negative for heartburn.  Genitourinary: Negative.   Musculoskeletal: Negative.  Negative for myalgias.  Neurological: Negative for headaches.  Hematological: Negative.   Psychiatric/Behavioral: Negative.   All other systems reviewed and are negative.      Objective:   Physical Exam  Vitals reviewed. Constitutional: She is oriented to person, place, and time. She appears well-developed and well-nourished. No distress.  HENT:  Head: Normocephalic and atraumatic.  Right Ear: External ear normal.  Mouth/Throat: Oropharynx is clear and moist.  Eyes: Pupils are equal, round, and reactive to light.  Neck: Normal range of motion. Neck supple. No thyromegaly present.  Cardiovascular: Normal rate, regular rhythm, normal heart sounds and intact distal pulses.   No murmur heard. Pulmonary/Chest: Effort normal and breath sounds normal. No respiratory distress. She has no wheezes.  Abdominal: Soft. Bowel sounds are normal. She exhibits no distension. There is no tenderness.  Musculoskeletal: Normal range of motion. She exhibits edema (Mild edema in bilateral ankles). She exhibits no tenderness.  Neurological: She is alert and oriented to person, place, and time. She has normal reflexes. No cranial nerve deficit.  Skin: Skin is warm and dry.  Psychiatric: She has a normal mood and affect. Her behavior is normal. Judgment and thought content normal.      BP 136/85  Pulse 90  Temp(Src) 98.7 F (37.1 C) (Oral)  Ht 5'  7" (1.702 m)  Wt 211 lb (95.709 kg)  BMI 33.04 kg/m2     Assessment &  Plan:  1. DVT, lower extremity, recurrent  2. Hypertension - losartan (COZAAR) 100 MG tablet; Take 1 tablet (100 mg total) by mouth daily.  Dispense: 90 tablet; Refill: 3 - NIFEdipine (PROCARDIA XL/ADALAT-CC) 60 MG 24 hr tablet; Take 1 tablet (60 mg total) by mouth daily.  Dispense: 90 tablet; Refill: 3 - CMP14+EGFR  3. GERD (gastroesophageal reflux disease)  4. Constipation   5. Hyperlipidemia - Lipid panel  6. Unspecified vitamin D deficiency - Vit D  25 hydroxy (rtn osteoporosis monitoring)  7. Other malaise and fatigue - Vit D  25 hydroxy (rtn osteoporosis monitoring) - Thyroid Panel With TSH   Continue all meds Labs pending Health Maintenance reviewed Diet and exercise encouraged RTO 3 months   Evelina Dun, FNP

## 2013-12-18 NOTE — Patient Instructions (Signed)
Health Maintenance, Female A healthy lifestyle and preventative care can promote health and wellness.  Maintain regular health, dental, and eye exams.  Eat a healthy diet. Foods like vegetables, fruits, whole grains, low-fat dairy products, and lean protein foods contain the nutrients you need without too many calories. Decrease your intake of foods high in solid fats, added sugars, and salt. Get information about a proper diet from your caregiver, if necessary.  Regular physical exercise is one of the most important things you can do for your health. Most adults should get at least 150 minutes of moderate-intensity exercise (any activity that increases your heart rate and causes you to sweat) each week. In addition, most adults need muscle-strengthening exercises on 2 or more days a week.   Maintain a healthy weight. The body mass index (BMI) is a screening tool to identify possible weight problems. It provides an estimate of body fat based on height and weight. Your caregiver can help determine your BMI, and can help you achieve or maintain a healthy weight. For adults 20 years and older:  A BMI below 18.5 is considered underweight.  A BMI of 18.5 to 24.9 is normal.  A BMI of 25 to 29.9 is considered overweight.  A BMI of 30 and above is considered obese.  Maintain normal blood lipids and cholesterol by exercising and minimizing your intake of saturated fat. Eat a balanced diet with plenty of fruits and vegetables. Blood tests for lipids and cholesterol should begin at age 20 and be repeated every 5 years. If your lipid or cholesterol levels are high, you are over 50, or you are a high risk for heart disease, you may need your cholesterol levels checked more frequently.Ongoing high lipid and cholesterol levels should be treated with medicines if diet and exercise are not effective.  If you smoke, find out from your caregiver how to quit. If you do not use tobacco, do not start.  Lung  cancer screening is recommended for adults aged 55 80 years who are at high risk for developing lung cancer because of a history of smoking. Yearly low-dose computed tomography (CT) is recommended for people who have at least a 30-pack-year history of smoking and are a current smoker or have quit within the past 15 years. A pack year of smoking is smoking an average of 1 pack of cigarettes a day for 1 year (for example: 1 pack a day for 30 years or 2 packs a day for 15 years). Yearly screening should continue until the smoker has stopped smoking for at least 15 years. Yearly screening should also be stopped for people who develop a health problem that would prevent them from having lung cancer treatment.  If you are pregnant, do not drink alcohol. If you are breastfeeding, be very cautious about drinking alcohol. If you are not pregnant and choose to drink alcohol, do not exceed 1 drink per day. One drink is considered to be 12 ounces (355 mL) of beer, 5 ounces (148 mL) of wine, or 1.5 ounces (44 mL) of liquor.  Avoid use of street drugs. Do not share needles with anyone. Ask for help if you need support or instructions about stopping the use of drugs.  High blood pressure causes heart disease and increases the risk of stroke. Blood pressure should be checked at least every 1 to 2 years. Ongoing high blood pressure should be treated with medicines, if weight loss and exercise are not effective.  If you are 55 to   66 years old, ask your caregiver if you should take aspirin to prevent strokes.  Diabetes screening involves taking a blood sample to check your fasting blood sugar level. This should be done once every 3 years, after age 50, if you are within normal weight and without risk factors for diabetes. Testing should be considered at a younger age or be carried out more frequently if you are overweight and have at least 1 risk factor for diabetes.  Breast cancer screening is essential preventative care  for women. You should practice "breast self-awareness." This means understanding the normal appearance and feel of your breasts and may include breast self-examination. Any changes detected, no matter how small, should be reported to a caregiver. Women in their 24s and 30s should have a clinical breast exam (CBE) by a caregiver as part of a regular health exam every 1 to 3 years. After age 92, women should have a CBE every year. Starting at age 49, women should consider having a mammogram (breast X-ray) every year. Women who have a family history of breast cancer should talk to their caregiver about genetic screening. Women at a high risk of breast cancer should talk to their caregiver about having an MRI and a mammogram every year.  Breast cancer gene (BRCA)-related cancer risk assessment is recommended for women who have family members with BRCA-related cancers. BRCA-related cancers include breast, ovarian, tubal, and peritoneal cancers. Having family members with these cancers may be associated with an increased risk for harmful changes (mutations) in the breast cancer genes BRCA1 and BRCA2. Results of the assessment will determine the need for genetic counseling and BRCA1 and BRCA2 testing.  The Pap test is a screening test for cervical cancer. Women should have a Pap test starting at age 71. Between ages 61 and 51, Pap tests should be repeated every 2 years. Beginning at age 60, you should have a Pap test every 3 years as long as the past 3 Pap tests have been normal. If you had a hysterectomy for a problem that was not cancer or a condition that could lead to cancer, then you no longer need Pap tests. If you are between ages 63 and 7, and you have had normal Pap tests going back 10 years, you no longer need Pap tests. If you have had past treatment for cervical cancer or a condition that could lead to cancer, you need Pap tests and screening for cancer for at least 20 years after your treatment. If Pap  tests have been discontinued, risk factors (such as a new sexual partner) need to be reassessed to determine if screening should be resumed. Some women have medical problems that increase the chance of getting cervical cancer. In these cases, your caregiver may recommend more frequent screening and Pap tests.  The human papillomavirus (HPV) test is an additional test that may be used for cervical cancer screening. The HPV test looks for the virus that can cause the cell changes on the cervix. The cells collected during the Pap test can be tested for HPV. The HPV test could be used to screen women aged 41 years and older, and should be used in women of any age who have unclear Pap test results. After the age of 66, women should have HPV testing at the same frequency as a Pap test.  Colorectal cancer can be detected and often prevented. Most routine colorectal cancer screening begins at the age of 31 and continues through age 25. However, your caregiver  may recommend screening at an earlier age if you have risk factors for colon cancer. On a yearly basis, your caregiver may provide home test kits to check for hidden blood in the stool. Use of a small camera at the end of a tube, to directly examine the colon (sigmoidoscopy or colonoscopy), can detect the earliest forms of colorectal cancer. Talk to your caregiver about this at age 73, when routine screening begins. Direct examination of the colon should be repeated every 5 to 10 years through age 61, unless early forms of pre-cancerous polyps or small growths are found.  Hepatitis C blood testing is recommended for all people born from [birth year] through 1963 and any individual with known risks for hepatitis C.  Practice safe sex. Use condoms and avoid high-risk sexual practices to reduce the spread of sexually transmitted infections (STIs). Sexually active women aged 38 and younger should be checked for Chlamydia, which is a common sexually transmitted infection.  Older women with new or multiple partners should also be tested for Chlamydia. Testing for other STIs is recommended if you are sexually active and at increased risk.  Osteoporosis is a disease in which the bones lose minerals and strength with aging. This can result in serious bone fractures. The risk of osteoporosis can be identified using a bone density scan. Women ages 44 and over and women at risk for fractures or osteoporosis should discuss screening with their caregivers. Ask your caregiver whether you should be taking a calcium supplement or vitamin D to reduce the rate of osteoporosis.  Menopause can be associated with physical symptoms and risks. Hormone replacement therapy is available to decrease symptoms and risks. You should talk to your caregiver about whether hormone replacement therapy is right for you.  Use sunscreen. Apply sunscreen liberally and repeatedly throughout the day. You should seek shade when your shadow is shorter than you. Protect yourself by wearing long sleeves, pants, a wide-brimmed hat, and sunglasses year round, whenever you are outdoors.  Notify your caregiver of new moles or changes in moles, especially if there is a change in shape or color. Also notify your caregiver if a mole is larger than the size of a pencil eraser.  Stay current with your immunizations. Document Released: 01/09/2011 Document Revised: 10/21/2012 Document Reviewed: 01/09/2011 [facility] Patient Information 2014 [place].

## 2013-12-19 ENCOUNTER — Telehealth: Payer: Self-pay | Admitting: Family Medicine

## 2013-12-19 ENCOUNTER — Other Ambulatory Visit: Payer: Self-pay | Admitting: Family

## 2013-12-19 LAB — CMP14+EGFR
ALK PHOS: 75 IU/L (ref 39–117)
ALT: 11 IU/L (ref 0–32)
AST: 15 IU/L (ref 0–40)
Albumin/Globulin Ratio: 1.7 (ref 1.1–2.5)
Albumin: 4.3 g/dL (ref 3.6–4.8)
BUN/Creatinine Ratio: 13 (ref 11–26)
BUN: 11 mg/dL (ref 8–27)
CO2: 26 mmol/L (ref 18–29)
Calcium: 9.2 mg/dL (ref 8.7–10.3)
Chloride: 98 mmol/L (ref 97–108)
Creatinine, Ser: 0.87 mg/dL (ref 0.57–1.00)
GFR calc Af Amer: 80 mL/min/{1.73_m2} (ref >59)
GFR calc non Af Amer: 70 mL/min/{1.73_m2} (ref >59)
GLOBULIN, TOTAL: 2.5 g/dL (ref 1.5–4.5)
Glucose: 85 mg/dL (ref 65–99)
Potassium: 3.2 mmol/L — ABNORMAL LOW (ref 3.5–5.2)
SODIUM: 140 mmol/L (ref 134–144)
Total Bilirubin: 0.3 mg/dL (ref 0.0–1.2)
Total Protein: 6.8 g/dL (ref 6.0–8.5)

## 2013-12-19 LAB — THYROID PANEL WITH TSH
FREE THYROXINE INDEX: 2.5 (ref 1.2–4.9)
T3 Uptake Ratio: 27 % (ref 24–39)
T4, Total: 9.1 ug/dL (ref 4.5–12.0)
TSH: 1.29 u[IU]/mL (ref 0.450–4.500)

## 2013-12-19 LAB — LIPID PANEL
CHOL/HDL RATIO: 4.3 ratio (ref 0.0–4.4)
CHOLESTEROL TOTAL: 231 mg/dL — AB (ref 100–199)
HDL: 54 mg/dL (ref >39)
LDL Calculated: 156 mg/dL — ABNORMAL HIGH (ref 0–99)
TRIGLYCERIDES: 106 mg/dL (ref 0–149)
VLDL Cholesterol Cal: 21 mg/dL (ref 5–40)

## 2013-12-19 LAB — VITAMIN D 25 HYDROXY (VIT D DEFICIENCY, FRACTURES): Vit D, 25-Hydroxy: 42.5 ng/mL (ref 30.0–100.0)

## 2013-12-19 MED ORDER — ATORVASTATIN CALCIUM 40 MG PO TABS: 40.0000 mg | ORAL_TABLET | Freq: Every day | ORAL | Status: DC

## 2013-12-19 NOTE — Telephone Encounter (Signed)
Message copied by Waverly Ferrari on Fri Dec 19, 2013 10:45 AM ------      Message from: [place], [place] A      Created: Fri Dec 19, 2013  9:42 AM       Kidney and liver function stable      Cholesterol levels high- Need to increase lipitor to 40 mg (new RX sent to pharmacy)- Need to be on low fat diet and exercise      Vit D levels WNL      Thyroid level WNL       ------

## 2014-01-05 ENCOUNTER — Ambulatory Visit (INDEPENDENT_AMBULATORY_CARE_PROVIDER_SITE_OTHER): Payer: Medicare Other | Admitting: Pharmacist

## 2014-01-05 DIAGNOSIS — D7589 Other specified diseases of blood and blood-forming organs: Secondary | ICD-10-CM

## 2014-01-05 DIAGNOSIS — D689 Coagulation defect, unspecified: Secondary | ICD-10-CM

## 2014-01-05 DIAGNOSIS — I82409 Acute embolism and thrombosis of unspecified deep veins of unspecified lower extremity: Secondary | ICD-10-CM

## 2014-01-05 LAB — POCT INR: INR: 2

## 2014-01-05 NOTE — Patient Instructions (Signed)
Anticoagulation Dose Instructions as of 01/05/2014     Renee Pratt Tue Wed Thu Fri Sat   New Dose 5 mg 5 mg 5 mg 5 mg 5 mg 5 mg 5 mg    Description       Continue  5mg  daily - 1 tablet a day.      INR was 2.0 today

## 2014-02-03 ENCOUNTER — Encounter: Payer: Self-pay | Admitting: Gastroenterology

## 2014-02-03 ENCOUNTER — Ambulatory Visit (INDEPENDENT_AMBULATORY_CARE_PROVIDER_SITE_OTHER): Payer: Medicare Other | Admitting: Gastroenterology

## 2014-02-03 ENCOUNTER — Encounter (INDEPENDENT_AMBULATORY_CARE_PROVIDER_SITE_OTHER): Payer: Self-pay

## 2014-02-03 VITALS — BP 133/78 | HR 92 | Temp 98.4°F | Ht 67.0 in | Wt 212.2 lb

## 2014-02-03 DIAGNOSIS — Z86010 Personal history of colonic polyps: Secondary | ICD-10-CM | POA: Insufficient documentation

## 2014-02-03 DIAGNOSIS — K5909 Other constipation: Secondary | ICD-10-CM

## 2014-02-03 DIAGNOSIS — K5904 Chronic idiopathic constipation: Secondary | ICD-10-CM

## 2014-02-03 DIAGNOSIS — K219 Gastro-esophageal reflux disease without esophagitis: Secondary | ICD-10-CM

## 2014-02-03 NOTE — Assessment & Plan Note (Signed)
DUE TO DIVERTICULOSIS  DRINK WATER EAT FIBER USE FIBER POWDER OR 1 PACKET ONCE DAILY FOR 3 DAYS THEN TWICE DAILY.  AVOID HIGHER DOSES IF IT CAUSES BLOATING & GAS. FOLLOW UP IN 6 MOS.

## 2014-02-03 NOTE — Assessment & Plan Note (Signed)
NEXT TCS JUN 2017

## 2014-02-03 NOTE — Assessment & Plan Note (Signed)
SX CONTROLLED(1-2X/WEEK)  USE NEXIUM OR DEXILANT PRN AVOID TRIGGERS FOR REFLUX FOLLOW UP IN 6 MOS.

## 2014-02-03 NOTE — Patient Instructions (Signed)
CONTINUE YOUR WEIGHT LOSS EFFORTS.  TRY LINZESS SAMPLES 145 MCG PILLS 30 MINS PRIOR TO BREAKS FAST. CALL ME IF YOU WANT A REFILL.  DRINK WATER TO KEEP YOUR URINE LIGHT YELLOW.  FOLLOW A HIGH FIBER DIET. AVOID ITEMS THAT CAUSE BLOATING & GAS. SEE INFO BELOW.  USE FIBER POWDER OR 1 PACKET ONCE DAILY FOR 3 DAYS THEN TWICE DAILY.  AVOID HIGHER DOSES IF IT CAUSES BLOATING & GAS.  USE DEXILANT OR BENTYL AS NEEDED FOR HEARTBURN.  AVOID FOOD THAT TRIGGERS REFLUX. SEE INFO BELOW.  FOLLOW UP IN 6 MOS.    Lifestyle and home remedies TO MANAGE REFLUX/CHEST PAIN  You may eliminate or reduce the frequency of heartburn by making the following lifestyle changes:    Control your weight. Being overweight is a major risk factor for heartburn and GERD. Excess pounds put pressure on your abdomen, pushing up your stomach and causing acid to back up into your esophagus.     Eat smaller meals. 4 TO 6 MEALS A DAY. This reduces pressure on the lower esophageal sphincter, helping to prevent the valve from opening and acid from washing back into your esophagus.     Loosen your belt. Clothes that fit tightly around your waist put pressure on your abdomen and the lower esophageal sphincter.     Eliminate heartburn triggers. Everyone has specific triggers. Common triggers such as fatty or fried foods, spicy food, tomato sauce, carbonated beverages, alcohol, chocolate, mint, garlic, onion, caffeine and nicotine may make heartburn worse.     Avoid stooping or bending. Tying your shoes is OK. Bending over for longer periods to weed your garden isn't, especially soon after eating.     Don't lie down after a meal. Wait at least three to four hours after eating before going to bed, and don't lie down right after eating.     PUT THE HEAD OF YOUR BED ON 6 INCH BLOCKS.   Alternative medicine   Several home remedies exist for treating GERD, but they provide only temporary relief. They include drinking baking soda  (sodium bicarbonate) added to water or drinking other fluids such as baking soda mixed with cream of tartar and water.    Although these liquids create temporary relief by neutralizing, washing away or buffering acids, eventually they aggravate the situation by adding gas and fluid to your stomach, increasing pressure and causing more acid reflux. Further, adding more sodium to your diet may increase your blood pressure and add stress to your heart, and excessive bicarbonate ingestion can alter the acid-base balance in your body.     High-Fiber Diet A high-fiber diet changes your normal diet to include more whole grains, legumes, fruits, and vegetables. Changes in the diet involve replacing refined carbohydrates with unrefined foods. The calorie level of the diet is essentially unchanged. The Dietary Reference Intake (recommended amount) for adult males is 38 grams per day. For adult females, it is 25 grams per day. Pregnant and lactating women should consume 28 grams of fiber per day. Fiber is the intact part of a plant that is not broken down during digestion. Functional fiber is fiber that has been isolated from the plant to provide a beneficial effect in the body. PURPOSE  Increase stool bulk.   Ease and regulate bowel movements.   Lower cholesterol.  INDICATIONS THAT YOU NEED MORE FIBER  Constipation and hemorrhoids.   Uncomplicated diverticulosis (intestine condition) and irritable bowel syndrome.   Weight management.   As a protective measure  against hardening of the arteries (atherosclerosis), diabetes, and cancer.   GUIDELINES FOR INCREASING FIBER IN THE DIET  Start adding fiber to the diet slowly. A gradual increase of about 5 more grams (2 slices of whole-wheat bread, 2 servings of most fruits or vegetables, or 1 bowl of high-fiber cereal) per day is best. Too rapid an increase in fiber may result in constipation, flatulence, and bloating.   Drink enough water and fluids to  keep your urine clear or pale yellow. Water, juice, or caffeine-free drinks are recommended. Not drinking enough fluid may cause constipation.   Eat a variety of high-fiber foods rather than one type of fiber.   Try to increase your intake of fiber through using high-fiber foods rather than fiber pills or supplements that contain small amounts of fiber.   The goal is to change the types of food eaten. Do not supplement your present diet with high-fiber foods, but replace foods in your present diet.  INCLUDE A VARIETY OF FIBER SOURCES  Replace refined and processed grains with whole grains, canned fruits with fresh fruits, and incorporate other fiber sources. White rice, white breads, and most bakery goods contain little or no fiber.   Brown whole-grain rice, buckwheat oats, and many fruits and vegetables are all good sources of fiber. These include: broccoli, Brussels sprouts, cabbage, cauliflower, beets, sweet potatoes, white potatoes (skin on), carrots, tomatoes, eggplant, squash, berries, fresh fruits, and dried fruits.   Cereals appear to be the richest source of fiber. Cereal fiber is found in whole grains and bran. Bran is the fiber-rich outer coat of cereal grain, which is largely removed in refining. In whole-grain cereals, the bran remains. In breakfast cereals, the largest amount of fiber is found in those with "bran" in their names. The fiber content is sometimes indicated on the label.   You may need to include additional fruits and vegetables each day.   In baking, for 1 cup white flour, you may use the following substitutions:   1 cup whole-wheat flour minus 2 tablespoons.   1/2 cup white flour plus 1/2 cup whole-wheat flour.

## 2014-02-03 NOTE — Progress Notes (Signed)
Subjective:    Patient ID: Renee Pratt, female    DOB: 1945-10-05, 68 y.o.   MRN: [id number]  Redge Gainer, MD  HPI No regular bowel movement. HAVING SOFT BLOBS IF SHE TAKES A STOOL SOFTENER([employer] BRAND-3 EVERY 2 DAYS WITH PRUNE JUICE).  TRIED LINZESS 290 MCG AND IT GAVE HER WATERY STOOLS. HAS PAIN BLOATING AND GAS ESPECIALLY IF SHE'S "LOADED".  ON COUMADIN FOR A DVT R LE x3. MOST RECENT SEP 2014. MOVED HERE FROM [place] AUG 2014. GAINED 20 LBS. GOING TO [facility] FOR 1 HOUR. RETIRED FROM [facility].  PT DENIES FEVER, CHILLS, HEMATOCHEZIA, nausea, vomiting, melena,  CHEST PAIN, SHORTNESS OF BREATH,  problems swallowing, problems with sedation, OR heartburn or indigestion.    Past Medical History  Diagnosis Date  . Uterine fibroid   . Blood clotting tendency   . DVT, lower extremity, recurrent     Right sided. First time 2011, another in 2012, 03/2013  . 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 Hyperlipidemia   . Hypertension   . GERD (gastroesophageal reflux disease)     hiatal hernia  . Weight gain 05/13/2013  . Constipation 05/13/2013  . Hiatal hernia   . DDD (degenerative disc disease), lumbar   . Uterine fibroid    Past Surgical History  Procedure Laterality Date  . Colposcopy    . Insertion of vena cava filter  01-17-2011    dr pindr # [phone]   No Known Allergies  Current Outpatient Prescriptions  Medication Sig Dispense Refill  . atorvastatin (LIPITOR) 40 MG tablet Take 1 tablet (40 mg total) by mouth daily.    . cholecalciferol (VITAMIN D) 1000 UNITS tablet Take 2,000 Units by mouth daily.    . hydrochlorothiazide (HYDRODIURIL) 25 MG tablet TAKE ONE TABLET BY MOUTH ONCE DAILY    . losartan (COZAAR) 100 MG tablet Take 1 tablet (100 mg total) by mouth daily.    . Multiple Vitamins-Minerals (CENTRUM SILVER PO) Take 1 tablet by mouth every other day.    Marland Kitchen NIFEdipine (PROCARDIA XL/ADALAT-CC) 60 MG 24 hr tablet Take 1 tablet (60 mg total) by  mouth daily.    Marland Kitchen terazosin (HYTRIN) 5 MG capsule TAKE ONE CAPSULE BY MOUTH AT BEDTIME    . warfarin (COUMADIN) 5 MG tablet TAKE ONE TABLET BY MOUTH ONCE DAILY    . Linaclotide (LINZESS) 290 MCG CAPS capsule One capsule daily on empty stomach. NOT USING   . NEXIUM 40 MG capsule Take 1 capsule (40 mg total) by mouth daily before breakfast.       Review of Systems     Objective:   Physical Exam  Vitals reviewed. Constitutional: She is oriented to person, place, and time. She appears well-developed and well-nourished. No distress.  HENT:  Head: Normocephalic and atraumatic.  Mouth/Throat: Oropharynx is clear and moist. No oropharyngeal exudate.  Eyes: Pupils are equal, round, and reactive to light. No scleral icterus.  Neck: Normal range of motion. Neck supple.  Cardiovascular: Normal rate, regular rhythm and normal heart sounds.   Pulmonary/Chest: Effort normal and breath sounds normal. No respiratory distress.  Abdominal: Soft. Bowel sounds are normal. She exhibits no distension. There is no tenderness.  Musculoskeletal: She exhibits edema.  DISCOLORATION ON RLE  Lymphadenopathy:    She has no cervical adenopathy.  Neurological: She is alert and oriented to person, place, and time.  NO FOCAL DEFICITS   Psychiatric: She has a normal mood and affect.  Assessment & Plan:

## 2014-02-03 NOTE — Progress Notes (Signed)
Reminders in epic °

## 2014-02-19 ENCOUNTER — Ambulatory Visit (INDEPENDENT_AMBULATORY_CARE_PROVIDER_SITE_OTHER): Payer: Medicare Other | Admitting: Pharmacist

## 2014-02-19 DIAGNOSIS — I82409 Acute embolism and thrombosis of unspecified deep veins of unspecified lower extremity: Secondary | ICD-10-CM

## 2014-02-19 DIAGNOSIS — D7589 Other specified diseases of blood and blood-forming organs: Secondary | ICD-10-CM

## 2014-02-19 DIAGNOSIS — D689 Coagulation defect, unspecified: Secondary | ICD-10-CM

## 2014-02-19 LAB — POCT INR: INR: 2.9

## 2014-02-19 NOTE — Patient Instructions (Signed)
Anticoagulation Dose Instructions as of 02/19/2014     Renee Pratt Tue Wed Thu Fri Sat   New Dose 5 mg 5 mg 5 mg 5 mg 5 mg 5 mg 5 mg    Description       Continue  5mg  daily - 1 tablet a day.      INR was 2.9 today

## 2014-02-26 ENCOUNTER — Other Ambulatory Visit: Payer: Self-pay | Admitting: Pharmacist

## 2014-02-26 DIAGNOSIS — I82401 Acute embolism and thrombosis of unspecified deep veins of right lower extremity: Secondary | ICD-10-CM

## 2014-03-06 ENCOUNTER — Ambulatory Visit: Payer: Medicare Other

## 2014-03-17 ENCOUNTER — Telehealth: Payer: Self-pay | Admitting: Pharmacist Clinician (PhC)/ Clinical Pharmacy Specialist

## 2014-03-17 ENCOUNTER — Other Ambulatory Visit: Payer: Self-pay | Admitting: Pharmacist Clinician (PhC)/ Clinical Pharmacy Specialist

## 2014-03-17 ENCOUNTER — Other Ambulatory Visit: Payer: Self-pay | Admitting: Family Medicine

## 2014-03-17 MED ORDER — WARFARIN SODIUM 5 MG PO TABS: 5.0000 mg | ORAL_TABLET | Freq: Every day | ORAL | Status: DC

## 2014-03-17 NOTE — Telephone Encounter (Signed)
Refilled warfarin prescription for 6 months

## 2014-03-17 NOTE — Telephone Encounter (Signed)
Pt was due 03/06/14 for INR recheck, not seen. Please review.

## 2014-03-20 ENCOUNTER — Encounter: Payer: Self-pay | Admitting: Pharmacist

## 2014-03-20 ENCOUNTER — Ambulatory Visit (INDEPENDENT_AMBULATORY_CARE_PROVIDER_SITE_OTHER): Payer: Medicare Other | Admitting: Pharmacist

## 2014-03-20 VITALS — BP 112/70 | HR 75 | Ht 67.0 in | Wt 211.5 lb

## 2014-03-20 DIAGNOSIS — D689 Coagulation defect, unspecified: Secondary | ICD-10-CM

## 2014-03-20 DIAGNOSIS — Z0000 Encounter for general adult medical examination without abnormal findings: Secondary | ICD-10-CM

## 2014-03-20 DIAGNOSIS — I82401 Acute embolism and thrombosis of unspecified deep veins of right lower extremity: Secondary | ICD-10-CM

## 2014-03-20 DIAGNOSIS — I82409 Acute embolism and thrombosis of unspecified deep veins of unspecified lower extremity: Secondary | ICD-10-CM

## 2014-03-20 LAB — POCT INR: INR: 2.3

## 2014-03-20 MED ORDER — NIFEDIPINE ER OSMOTIC RELEASE 30 MG PO TB24: 30.0000 mg | ORAL_TABLET | Freq: Every day | ORAL | Status: DC

## 2014-03-20 NOTE — Progress Notes (Signed)
Patient ID: Renee Pratt, female   DOB: 1946/06/24, 68 y.o.   MRN: [id number] Subjective:    Renee Pratt is a 68 y.o. female who presents for Medicare Initial Wellness Visit and to recheck INR. During visit patient c/o [name].  She also states that she is seeing her hairdresser today to discuss hair loss.  She feels that this is related to atorvastatin though because she stopped for 3 months in past and improved.  When she restarted atorvastatin she feels that hair loss restarted.   Preventive Screening-Counseling & Management  Tobacco History  Smoking status  . Former Smoker -- 0.25 packs/day for 20 years  . Quit date: 10/17/1997  Smokeless tobacco  . Never Used    Comment: Quit x 25 years     Current Problems (verified) Patient Active Problem List   Diagnosis Date Noted  . Personal history of colonic polyps 02/03/2014  . DVT, lower extremity, recurrent, right 07/21/2013  . Upper abdominal pain 05/20/2013  . Weight gain 05/13/2013  . Constipation - functional 05/13/2013  . GERD (gastroesophageal reflux disease)   . Pain in limb 03/31/2013  . 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 Hyperlipidemia   . Hypertension   . Uterine fibroid   . Blood clotting tendency   . Unspecified vitamin D deficiency 12/24/2012  . Obesity, unspecified 12/24/2012  . Unspecified essential hypertension 10/17/2012  . Other and unspecified hyperlipidemia 10/17/2012    Medications Prior to Visit Current Outpatient Prescriptions on File Prior to Visit  Medication Sig Dispense Refill  . atorvastatin (LIPITOR) 40 MG tablet Take 1 tablet (40 mg total) by mouth daily.  90 tablet  3  . cholecalciferol (VITAMIN D) 1000 UNITS tablet Take 2,000 Units by mouth daily.      . hydrochlorothiazide (HYDRODIURIL) 25 MG tablet TAKE ONE TABLET BY MOUTH ONCE DAILY  30 tablet  5  . losartan (COZAAR) 100 MG tablet Take 1 tablet (100 mg total) by mouth daily.  90 tablet  3  . Multiple Vitamins-Minerals (CENTRUM SILVER PO)  Take 1 tablet by mouth every other day.      . terazosin (HYTRIN) 5 MG capsule TAKE ONE CAPSULE BY MOUTH AT BEDTIME  30 capsule  5  . warfarin (COUMADIN) 5 MG tablet Take 1 tablet (5 mg total) by mouth daily.  30 tablet  2   No current facility-administered medications on file prior to visit.    Current Medications (verified) Current Outpatient Prescriptions  Medication Sig Dispense Refill  . atorvastatin (LIPITOR) 40 MG tablet Take 1 tablet (40 mg total) by mouth daily.  90 tablet  3  . cholecalciferol (VITAMIN D) 1000 UNITS tablet Take 2,000 Units by mouth daily.      . hydrochlorothiazide (HYDRODIURIL) 25 MG tablet TAKE ONE TABLET BY MOUTH ONCE DAILY  30 tablet  5  . Linaclotide (LINZESS) 145 MCG CAPS capsule Take 145 mcg by mouth daily.      Marland Kitchen losartan (COZAAR) 100 MG tablet Take 1 tablet (100 mg total) by mouth daily.  90 tablet  3  . Multiple Vitamins-Minerals (CENTRUM SILVER PO) Take 1 tablet by mouth every other day.      . terazosin (HYTRIN) 5 MG capsule TAKE ONE CAPSULE BY MOUTH AT BEDTIME  30 capsule  5  . warfarin (COUMADIN) 5 MG tablet Take 1 tablet (5 mg total) by mouth daily.  30 tablet  2  . esomeprazole (NEXIUM) 40 MG capsule Take 40 mg by mouth daily before breakfast.  As needed      . NIFEdipine (PROCARDIA XL) 30 MG 24 hr tablet Take 1 tablet (30 mg total) by mouth daily.  90 tablet  1   No current facility-administered medications for this visit.     Allergies (verified) Review of patient's allergies indicates no known allergies.   PAST HISTORY  Family History Family History  Problem Relation Age of Onset  . Kidney disease Mother   . Hypertension Mother   . Stroke Mother 59  . AAA (abdominal aortic aneurysm) Mother   . Cancer Father 2    lung cancer  . Colon cancer Neg Hx   . Clotting disorder Neg Hx   . Other Cousin     died fo pulmonary embolus  . Depression Maternal Grandmother     Social History History  Substance Use Topics  . Smoking status:  Former Smoker -- 0.25 packs/day for 20 years    Quit date: 10/17/1997  . Smokeless tobacco: Never Used     Comment: Quit x 25 years  . Alcohol Use: Yes     Comment: Socially     Are there smokers in your home (other than you)? No  Risk Factors Current exercise habits: Gym/ health club routine includes attends [employer] but has decreased lately.  Dietary issues discussed:  none   Cardiac risk factors: advanced age (older than 87 for men, 45 for women), dyslipidemia, hypertension, obesity (BMI >= 30 kg/m2) and sedentary lifestyle.  Depression Screen (Note: if answer to either of the following is "Yes", a more complete depression screening is indicated)   Over the past 2 weeks, have you felt down, depressed or hopeless? Yes  Over the past 2 weeks, have you felt little interest or pleasure in doing things? Yes  Have you lost interest or pleasure in daily life? No  Do you often feel hopeless? No  Do you cry easily over simple problems? No  Activities of Daily Living In your present state of health, do you have any difficulty performing the following activities?:  Driving? No Managing money?  No Feeding yourself? No Getting from bed to chair? No  Climbing a flight of stairs? No Preparing food and eating?: No Bathing or showering? No Getting dressed: No Getting to the toilet? No Using the toilet:No Moving around from place to place: No In the past year have you fallen or had a near fall?:No   Are you sexually active?  No  Do you have more than one partner?  No  Hearing Difficulties: No Do you often ask people to speak up or repeat themselves? No Do you experience ringing or noises in your ears? No Do you have difficulty understanding soft or whispered voices? No   Do you feel that you have a problem with memory? No  Do you often misplace items? Yes  Do you feel safe at home?  Yes  Cognitive Testing  Alert? Yes  Normal Appearance?Yes  Oriented to person? Yes  Place?  Yes   Time? Yes  Recall of three objects?  Yes  Can perform simple calculations? Yes  Displays appropriate judgment?Yes  Can read the correct time from a watch face?Yes   Advanced Directives have been discussed with the patient? Yes  List the Names of Other Physician/Practitioners you currently use: 1.  GI - Dr Eden Lathe 2.  Eye - Dr Iona Hansen 3.  Hematology - Madilyn Hook any recent Medical Services you may have received from other than [facility] providers in the  past year (date may be approximate).  Immunization History  Administered Date(s) Administered  . Influenza,inj,Quad PF,36+ Mos 05/08/2013  . Pneumococcal Polysaccharide-23 09/11/2012  . Tdap 07/10/2002  . Zoster (though patient states she does not think she has received shingles / Zosatvax) 07/11/2007    Screening Tests Health Maintenance  Topic Date Due  . Influenza Vaccine  02/07/2014  . Tetanus/tdap  06/19/2014 (Originally 07/10/2012)  . Mammogram  02/03/2015  . Colonoscopy  12/11/2020  . Pneumococcal Polysaccharide Vaccine Age 40 And Over  Completed  . Zostavax  Completed    All answers were reviewed with the patient and necessary referrals were made:  Cherre Robins, [facility]   03/21/2014   History reviewed: allergies, current medications, past family history, past medical history, past social history, past surgical history and problem list   Objective:  Body mass index is 33.12 kg/(m^2). BP 112/70  Pulse 75  Ht 5\' 7"  (1.702 m)  Wt 211 lb 8 oz (95.936 kg)  BMI 33.12 kg/m2  INR was 2.3 today [name] bilaterally 1+ Denies SOB  Assessment:    Initial Medicare Wellness Visit Therapeutic Anticoagulation   Plan:     During the course of the visit the patient was educated and counseled about appropriate screening and preventive services including:    Pneumococcal vaccine - plan to get next month  Influenza vaccine - will give next month  Td vaccine - check on cost $45 - will get later in year  Screening  mammography - UTD  Screening Pap smear and pelvic exam -UTD  Bone densitometry screening - UTD  Colorectal cancer screening   Diabetes screening  Glaucoma screening - UTD   Advanced directives: Patient given [facility] packet  Decrease procardia / nifedipine dose from 60mg  daily to 30mg  daily to see if edema improves - monitor HR and BP. Continue atorvastatin - patient to have lipids checked in 1 month and will consider alternatives then.  Anticoagulation Dose Instructions as of 03/20/2014     Dorene Grebe Tue Wed Thu Fri Sat   New Dose 5 mg 5 mg 5 mg 5 mg 5 mg 5 mg 5 mg    Description       Continue  5mg  daily - 1 tablet a day.        Diet review for nutrition referral? Yes ___X - declined_  Not Indicated ____   Patient Instructions (the written plan) was given to the patient.  Medicare Attestation I have personally reviewed: The patient's medical and social history Their use of alcohol, tobacco or illicit drugs Their current medications and supplements The patient's functional ability including ADLs,fall risks, home safety risks, cognitive, and hearing and visual impairment Diet and physical activities Evidence for depression or mood disorders  The patient's weight, height, BMI, and BP/HR have been recorded in the chart.  I have made referrals, counseling, and provided education to the patient based on review of the above and I have provided the patient with a written personalized care plan for preventive services.     Cherre Robins, [facility]   03/21/2014

## 2014-03-20 NOTE — Patient Instructions (Addendum)
Anticoagulation Dose Instructions as of 03/20/2014     Renee Pratt Tue Wed Thu Fri Sat   New Dose 5 mg 5 mg 5 mg 5 mg 5 mg 5 mg 5 mg    Description       Continue  44m daily - 1 tablet a day.      INR was 2.3 today    Health Maintenance Summary    INFLUENZA VACCINE Overdue 02/07/2014 Will get in October    TETANUS/TDAP Postponed 06/19/2014 Cost is $45   Pneumonia Vaccine Need Prevnar 13  Will get in October   DEXA / Bone density Next Due 12/19/2014 Done 12/18/2012   Shingles / Zostavax Due  Cost is about $200   Pap Smear Next Due 01/22/2015 Done 01/21/2013   Colonoscopy Next Due  2016 Last done 12/12/2010    MAMMOGRAM Next Due 02/03/2015  Done 02/02/2014    COLONOSCOPY Next Due 12/11/2020  Done 12/12/2010      Preventive Care for Adults A healthy lifestyle and preventive care can promote health and wellness. Preventive health guidelines for women include the following key practices.  A routine yearly physical is a good way to check with your health care provider about your health and preventive screening. It is a chance to share any concerns and updates on your health and to receive a thorough exam.  Visit your dentist for a routine exam and preventive care every 6 months. Brush your teeth twice a day and floss once a day. Good oral hygiene prevents tooth decay and gum disease.  The frequency of eye exams is based on your age, health, family medical history, use of contact lenses, and other factors. Follow your health care provider's recommendations for frequency of eye exams.  Eat a healthy diet. Foods like vegetables, fruits, whole grains, low-fat dairy products, and lean protein foods contain the nutrients you need without too many calories. Decrease your intake of foods high in solid fats, added sugars, and salt. Eat the right amount of calories for you.Get information about a proper diet from your health care provider, if necessary.  Regular physical exercise is one of the most  important things you can do for your health. Most adults should get at least 150 minutes of moderate-intensity exercise (any activity that increases your heart rate and causes you to sweat) each week. In addition, most adults need muscle-strengthening exercises on 2 or more days a week.  Maintain a healthy weight. The body mass index (BMI) is a screening tool to identify possible weight problems. It provides an estimate of body fat based on height and weight. Your health care provider can find your BMI and can help you achieve or maintain a healthy weight.For adults 20 years and older:  A BMI below 18.5 is considered underweight.  A BMI of 18.5 to 24.9 is normal.  A BMI of 25 to 29.9 is considered overweight.  A BMI of 30 and above is considered obese.  Maintain normal blood lipids and cholesterol levels by exercising and minimizing your intake of saturated fat. Eat a balanced diet with plenty of fruit and vegetables. Blood tests for lipids and cholesterol should begin at age 898and be repeated every 5 years. If your lipid or cholesterol levels are high, you are over 50, or you are at high risk for heart disease, you may need your cholesterol levels checked more frequently.Ongoing high lipid and cholesterol levels should be treated with medicines if diet and exercise are not working.  If you smoke, find out from your health care provider how to quit. If you do not use tobacco, do not start.  Lung cancer screening is recommended for adults aged 71-80 years who are at high risk for developing lung cancer because of a history of smoking. A yearly low-dose CT scan of the lungs is recommended for people who have at least a 30-pack-year history of smoking and are a current smoker or have quit within the past 15 years. A pack year of smoking is smoking an average of 1 pack of cigarettes a day for 1 year (for example: 1 pack a day for 30 years or 2 packs a day for 15 years). Yearly screening should  continue until the smoker has stopped smoking for at least 15 years. Yearly screening should be stopped for people who develop a health problem that would prevent them from having lung cancer treatment.  If you are pregnant, do not drink alcohol. If you are breastfeeding, be very cautious about drinking alcohol. If you are not pregnant and choose to drink alcohol, do not have more than 1 drink per day. One drink is considered to be 12 ounces (355 mL) of beer, 5 ounces (148 mL) of wine, or 1.5 ounces (44 mL) of liquor.  Avoid use of street drugs. Do not share needles with anyone. Ask for help if you need support or instructions about stopping the use of drugs.  High blood pressure causes heart disease and increases the risk of stroke. Your blood pressure should be checked at least every 1 to 2 years. Ongoing high blood pressure should be treated with medicines if weight loss and exercise do not work.  If you are 66-81 years old, ask your health care provider if you should take aspirin to prevent strokes.  Diabetes screening involves taking a blood sample to check your fasting blood sugar level. This should be done once every 3 years, after age 40, if you are within normal weight and without risk factors for diabetes. Testing should be considered at a younger age or be carried out more frequently if you are overweight and have at least 1 risk factor for diabetes.  Breast cancer screening is essential preventive care for women. You should practice "breast self-awareness." This means understanding the normal appearance and feel of your breasts and may include breast self-examination. Any changes detected, no matter how small, should be reported to a health care provider. Women in their 64s and 30s should have a clinical breast exam (CBE) by a health care provider as part of a regular health exam every 1 to 3 years. After age 69, women should have a CBE every year. Starting at age 23, women should consider  having a mammogram (breast X-ray test) every year. Women who have a family history of breast cancer should talk to their health care provider about genetic screening. Women at a high risk of breast cancer should talk to their health care providers about having an MRI and a mammogram every year.  Breast cancer gene (BRCA)-related cancer risk assessment is recommended for women who have family members with BRCA-related cancers. BRCA-related cancers include breast, ovarian, tubal, and peritoneal cancers. Having family members with these cancers may be associated with an increased risk for harmful changes (mutations) in the breast cancer genes BRCA1 and BRCA2. Results of the assessment will determine the need for genetic counseling and BRCA1 and BRCA2 testing.  Routine pelvic exams to screen for cancer are no longer recommended for  nonpregnant women who are considered low risk for cancer of the pelvic organs (ovaries, uterus, and vagina) and who do not have symptoms. Ask your health care provider if a screening pelvic exam is right for you.  If you have had past treatment for cervical cancer or a condition that could lead to cancer, you need Pap tests and screening for cancer for at least 20 years after your treatment. If Pap tests have been discontinued, your risk factors (such as having a new sexual partner) need to be reassessed to determine if screening should be resumed. Some women have medical problems that increase the chance of getting cervical cancer. In these cases, your health care provider may recommend more frequent screening and Pap tests.  The HPV test is an additional test that may be used for cervical cancer screening. The HPV test looks for the virus that can cause the cell changes on the cervix. The cells collected during the Pap test can be tested for HPV. The HPV test could be used to screen women aged 75 years and older, and should be used in women of any age who have unclear Pap test  results. After the age of 75, women should have HPV testing at the same frequency as a Pap test.  Colorectal cancer can be detected and often prevented. Most routine colorectal cancer screening begins at the age of 46 years and continues through age 19 years. However, your health care provider may recommend screening at an earlier age if you have risk factors for colon cancer. On a yearly basis, your health care provider may provide home test kits to check for hidden blood in the stool. Use of a small camera at the end of a tube, to directly examine the colon (sigmoidoscopy or colonoscopy), can detect the earliest forms of colorectal cancer. Talk to your health care provider about this at age 60, when routine screening begins. Direct exam of the colon should be repeated every 5-10 years through age 52 years, unless early forms of pre-cancerous polyps or small growths are found.  People who are at an increased risk for hepatitis B should be screened for this virus. You are considered at high risk for hepatitis B if:  You were born in a country where hepatitis B occurs often. Talk with your health care provider about which countries are considered high risk.  Your parents were born in a high-risk country and you have not received a shot to protect against hepatitis B (hepatitis B vaccine).  You have HIV or AIDS.  You use needles to inject street drugs.  You live with, or have sex with, someone who has hepatitis B.  You get hemodialysis treatment.  You take certain medicines for conditions like cancer, organ transplantation, and autoimmune conditions.  Hepatitis C blood testing is recommended for all people born from [birth year] through 1965 and any individual with known risks for hepatitis C.  Practice safe sex. Use condoms and avoid high-risk sexual practices to reduce the spread of sexually transmitted infections (STIs). STIs include gonorrhea, chlamydia, syphilis, trichomonas, herpes, HPV, and human  immunodeficiency virus (HIV). Herpes, HIV, and HPV are viral illnesses that have no cure. They can result in disability, cancer, and death.  You should be screened for sexually transmitted illnesses (STIs) including gonorrhea and chlamydia if:  You are sexually active and are younger than 24 years.  You are older than 24 years and your health care provider tells you that you are at risk for this  type of infection.  Your sexual activity has changed since you were last screened and you are at an increased risk for chlamydia or gonorrhea. Ask your health care provider if you are at risk.  If you are at risk of being infected with HIV, it is recommended that you take a prescription medicine daily to prevent HIV infection. This is called preexposure prophylaxis (PrEP). You are considered at risk if:  You are a heterosexual woman, are sexually active, and are at increased risk for HIV infection.  You take drugs by injection.  You are sexually active with a partner who has HIV.  Talk with your health care provider about whether you are at high risk of being infected with HIV. If you choose to begin PrEP, you should first be tested for HIV. You should then be tested every 3 months for as long as you are taking PrEP.  Osteoporosis is a disease in which the bones lose minerals and strength with aging. This can result in serious bone fractures or breaks. The risk of osteoporosis can be identified using a bone density scan. Women ages 67 years and over and women at risk for fractures or osteoporosis should discuss screening with their health care providers. Ask your health care provider whether you should take a calcium supplement or vitamin D to reduce the rate of osteoporosis.  Menopause can be associated with physical symptoms and risks. Hormone replacement therapy is available to decrease symptoms and risks. You should talk to your health care provider about whether hormone replacement therapy is right  for you.  Use sunscreen. Apply sunscreen liberally and repeatedly throughout the day. You should seek shade when your shadow is shorter than you. Protect yourself by wearing long sleeves, pants, a wide-brimmed hat, and sunglasses year round, whenever you are outdoors.  Once a month, do a whole body skin exam, using a mirror to look at the skin on your back. Tell your health care provider of new moles, moles that have irregular borders, moles that are larger than a pencil eraser, or moles that have changed in shape or color.  Stay current with required vaccines (immunizations).  Influenza vaccine. All adults should be immunized every year.  Tetanus, diphtheria, and acellular pertussis (Td, Tdap) vaccine. Pregnant women should receive 1 dose of Tdap vaccine during each pregnancy. The dose should be obtained regardless of the length of time since the last dose. Immunization is preferred during the 27th-36th week of gestation. An adult who has not previously received Tdap or who does not know her vaccine status should receive 1 dose of Tdap. This initial dose should be followed by tetanus and diphtheria toxoids (Td) booster doses every 10 years. Adults with an unknown or incomplete history of completing a 3-dose immunization series with Td-containing vaccines should begin or complete a primary immunization series including a Tdap dose. Adults should receive a Td booster every 10 years.  Varicella vaccine. An adult without evidence of immunity to varicella should receive 2 doses or a second dose if she has previously received 1 dose. Pregnant females who do not have evidence of immunity should receive the first dose after pregnancy. This first dose should be obtained before leaving the health care facility. The second dose should be obtained 4-8 weeks after the first dose.  Human papillomavirus (HPV) vaccine. Females aged 13-26 years who have not received the vaccine previously should obtain the 3-dose  series. The vaccine is not recommended for use in pregnant females. However, pregnancy  testing is not needed before receiving a dose. If a female is found to be pregnant after receiving a dose, no treatment is needed. In that case, the remaining doses should be delayed until after the pregnancy. Immunization is recommended for any person with an immunocompromised condition through the age of 61 years if she did not get any or all doses earlier. During the 3-dose series, the second dose should be obtained 4-8 weeks after the first dose. The third dose should be obtained 24 weeks after the first dose and 16 weeks after the second dose.  Zoster vaccine. One dose is recommended for adults aged 44 years or older unless certain conditions are present.  Measles, mumps, and rubella (MMR) vaccine. Adults born before [birth year] generally are considered immune to measles and mumps. Adults born in [birth year] or later should have 1 or more doses of MMR vaccine unless there is a contraindication to the vaccine or there is laboratory evidence of immunity to each of the three diseases. A routine second dose of MMR vaccine should be obtained at least 28 days after the first dose for students attending postsecondary schools, health care workers, or international travelers. People who received inactivated measles vaccine or an unknown type of measles vaccine during 1963-1967 should receive 2 doses of MMR vaccine. People who received inactivated mumps vaccine or an unknown type of mumps vaccine before 1979 and are at high risk for mumps infection should consider immunization with 2 doses of MMR vaccine. For females of childbearing age, rubella immunity should be determined. If there is no evidence of immunity, females who are not pregnant should be vaccinated. If there is no evidence of immunity, females who are pregnant should delay immunization until after pregnancy. Unvaccinated health care workers born before 60 who lack laboratory  evidence of measles, mumps, or rubella immunity or laboratory confirmation of disease should consider measles and mumps immunization with 2 doses of MMR vaccine or rubella immunization with 1 dose of MMR vaccine.  Pneumococcal 13-valent conjugate (PCV13) vaccine. When indicated, a person who is uncertain of her immunization history and has no record of immunization should receive the PCV13 vaccine. An adult aged 64 years or older who has certain medical conditions and has not been previously immunized should receive 1 dose of PCV13 vaccine. This PCV13 should be followed with a dose of pneumococcal polysaccharide (PPSV23) vaccine. The PPSV23 vaccine dose should be obtained at least 8 weeks after the dose of PCV13 vaccine. An adult aged 82 years or older who has certain medical conditions and previously received 1 or more doses of PPSV23 vaccine should receive 1 dose of PCV13. The PCV13 vaccine dose should be obtained 1 or more years after the last PPSV23 vaccine dose.  Pneumococcal polysaccharide (PPSV23) vaccine. When PCV13 is also indicated, PCV13 should be obtained first. All adults aged 36 years and older should be immunized. An adult younger than age 34 years who has certain medical conditions should be immunized. Any person who resides in a nursing home or long-term care facility should be immunized. An adult smoker should be immunized. People with an immunocompromised condition and certain other conditions should receive both PCV13 and PPSV23 vaccines. People with human immunodeficiency virus (HIV) infection should be immunized as soon as possible after diagnosis. Immunization during chemotherapy or radiation therapy should be avoided. Routine use of PPSV23 vaccine is not recommended for American Indians, [place] Natives, or people younger than 65 years unless there are medical conditions that require PPSV23 vaccine. When  indicated, people who have unknown immunization and have no record of immunization  should receive PPSV23 vaccine. One-time revaccination 5 years after the first dose of PPSV23 is recommended for people aged 19-64 years who have chronic kidney failure, nephrotic syndrome, asplenia, or immunocompromised conditions. People who received 1-2 doses of PPSV23 before age 77 years should receive another dose of PPSV23 vaccine at age 63 years or later if at least 5 years have passed since the previous dose. Doses of PPSV23 are not needed for people immunized with PPSV23 at or after age 9 years.  Meningococcal vaccine. Adults with asplenia or persistent complement component deficiencies should receive 2 doses of quadrivalent meningococcal conjugate (MenACWY-D) vaccine. The doses should be obtained at least 2 months apart. Microbiologists working with certain meningococcal bacteria, [place] recruits, people at risk during an outbreak, and people who travel to or live in countries with a high rate of meningitis should be immunized. A first-year college student up through age 51 years who is living in a residence hall should receive a dose if she did not receive a dose on or after her 16th birthday. Adults who have certain high-risk conditions should receive one or more doses of vaccine.  Hepatitis A vaccine. Adults who wish to be protected from this disease, have certain high-risk conditions, work with hepatitis A-infected animals, work in hepatitis A research labs, or travel to or work in countries with a high rate of hepatitis A should be immunized. Adults who were previously unvaccinated and who anticipate close contact with an international adoptee during the first 60 days after arrival in the [place] from a country with a high rate of hepatitis A should be immunized.  Hepatitis B vaccine. Adults who wish to be protected from this disease, have certain high-risk conditions, may be exposed to blood or other infectious body fluids, are household contacts or sex partners of hepatitis B positive  people, are clients or workers in certain care facilities, or travel to or work in countries with a high rate of hepatitis B should be immunized.  Ages 32 years and over  Blood pressure check.** / Every 1 to 2 years.  Lipid and cholesterol check.** / Every 5 years beginning at age 11 years.  Lung cancer screening. / Every year if you are aged 42-80 years and have a 30-pack-year history of smoking and currently smoke or have quit within the past 15 years. Yearly screening is stopped once you have quit smoking for at least 15 years or develop a health problem that would prevent you from having lung cancer treatment.  Clinical breast exam.** / Every year after age 24 years.  BRCA-related cancer risk assessment.** / For women who have family members with a BRCA-related cancer (breast, ovarian, tubal, or peritoneal cancers).  Mammogram.** / Every year beginning at age 29 years and continuing for as long as you are in good health. Consult with your health care provider.  Pap test.** / Every 3 years starting at age 25 years through age 9 or 101 years with 3 consecutive normal Pap tests. Testing can be stopped between 65 and 70 years with 3 consecutive normal Pap tests and no abnormal Pap or HPV tests in the past 10 years.  HPV screening.** / Every 3 years from ages 48 years through ages 89 or 49 years with a history of 3 consecutive normal Pap tests. Testing can be stopped between 65 and 70 years with 3 consecutive normal Pap tests and no abnormal Pap or  HPV tests in the past 10 years.  Fecal occult blood test (FOBT) of stool. / Every year beginning at age 11 years and continuing until age 58 years. You may not need to do this test if you get a colonoscopy every 10 years.  Flexible sigmoidoscopy or colonoscopy.** / Every 5 years for a flexible sigmoidoscopy or every 10 years for a colonoscopy beginning at age 9 years and continuing until age 6 years.  Hepatitis C blood test.** / For all people  born from [birth year] through 1963 and any individual with known risks for hepatitis C.  Osteoporosis screening.** / A one-time screening for women ages 47 years and over and women at risk for fractures or osteoporosis.  Skin self-exam. / Monthly.  Influenza vaccine. / Every year.  Tetanus, diphtheria, and acellular pertussis (Tdap/Td) vaccine.** / 1 dose of Td every 10 years.  Varicella vaccine.** / Consult your health care provider.  Zoster vaccine.** / 1 dose for adults aged 11 years or older.  Pneumococcal 13-valent conjugate (PCV13) vaccine.** / Consult your health care provider.  Pneumococcal polysaccharide (PPSV23) vaccine.** / 1 dose for all adults aged 76 years and older.  Meningococcal vaccine.** / Consult your health care provider.  Hepatitis A vaccine.** / Consult your health care provider.  Hepatitis B vaccine.** / Consult your health care provider.  Haemophilus influenzae type b (Hib) vaccine.** / Consult your health care provider. ** Family history and personal history of risk and conditions may change your health care provider's recommendations. Document Released: 08/22/2001 Document Revised: 11/10/2013 Document Reviewed: 11/21/2010 [facility] Patient Information 2015 [place], [place]. This information is not intended to replace advice given to you by your health care provider. Make sure you discuss any questions you have with your health care provider.

## 2014-03-21 ENCOUNTER — Encounter: Payer: Self-pay | Admitting: Pharmacist

## 2014-03-31 ENCOUNTER — Encounter: Payer: Self-pay | Admitting: Vascular Surgery

## 2014-04-01 ENCOUNTER — Ambulatory Visit (INDEPENDENT_AMBULATORY_CARE_PROVIDER_SITE_OTHER): Payer: Medicare Other | Admitting: Vascular Surgery

## 2014-04-01 ENCOUNTER — Encounter: Payer: Self-pay | Admitting: Vascular Surgery

## 2014-04-01 VITALS — BP 149/74 | HR 82 | Temp 98.0°F | Resp 16 | Ht 66.0 in | Wt 210.0 lb

## 2014-04-01 DIAGNOSIS — I872 Venous insufficiency (chronic) (peripheral): Secondary | ICD-10-CM

## 2014-04-01 DIAGNOSIS — I82409 Acute embolism and thrombosis of unspecified deep veins of unspecified lower extremity: Secondary | ICD-10-CM

## 2014-04-01 DIAGNOSIS — I82401 Acute embolism and thrombosis of unspecified deep veins of right lower extremity: Secondary | ICD-10-CM

## 2014-04-01 NOTE — Assessment & Plan Note (Signed)
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 I've explained her that she does have evidence of chronic venous insufficiency. I've discussed the importance of intermittent leg elevation the proper positioning for this. In addition I have given her a prescription for knee-high compression stockings with a gradient of 20-30 mm of mercury. I have encouraged her to walk as much as possible. She does belong to silver sneakers at the [facility]. I have encouraged her to avoid prolonged sitting and standing. I've asked her to consider water aerobics also which is helpful for people with venous insufficiency. I have also instructed her to keep her skin well-lubricated in the winter to prevent cracking of the skin which can start an ulcer. She understands that this is a chronic problem. I'll be happy to see her back at any time if she develops new symptoms.

## 2014-04-01 NOTE — Progress Notes (Signed)
[facility] and [facility]in Specialist of [facility]  Patient name: Renee Pratt MRN: [id number] DOB: Mar 12, 1946 Sex: female  REASON FOR VISIT: follow up of chronic venous insufficiency.  HPI: Syndey Jaskolski Pratt is a 68 y.o. female who was last seen by Dr. Trula Slade a year ago. She's had 4 previous blood clots in her leg. 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 At that time she was placed on Elliquis which she took for several months but then when her insurance would no longer cover this she switched to Coumadin. She is maintained on Coumadin. She was also referred for hematologic workup and she states that this was unremarkable.  Her main concern is some hyperpigmentation adjacent to her right medial malleolus. She also notes some leg swelling at times but has had no acute increase in leg swelling. She does wear compression stockings with a mild gradient. She's had no venous ulcers. She has no significant leg pain.  Of note she had an IVC filter placed in [place] in 2012 which remains in place.  Past Medical History  Diagnosis Date  . Uterine fibroid   . Blood clotting tendency   . DVT, lower extremity, recurrent     Right sided. First time 2011, another in 2012, 03/2013  . 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 Hyperlipidemia   . Hypertension   . GERD (gastroesophageal reflux disease)     hiatal hernia  . Weight gain 05/13/2013  . Constipation 05/13/2013  . Hiatal hernia   . DDD (degenerative disc disease), lumbar   . Uterine fibroid   . Cataract   . Bilateral dry eyes    Family History  Problem Relation Age of Onset  . Kidney disease Mother   . Hypertension Mother   . Stroke Mother 3  . AAA (abdominal aortic aneurysm) Mother   . Cancer Father 69    lung cancer  . Colon cancer Neg Hx   . Clotting disorder Neg Hx   . Other Cousin     died fo pulmonary embolus  . Depression Maternal  Grandmother    SOCIAL HISTORY: History  Substance Use Topics  . Smoking status: Former Smoker -- 0.25 packs/day for 20 years    Quit date: 10/17/1997  . Smokeless tobacco: Never Used     Comment: Quit x 25 years  . Alcohol Use: Yes     Comment: Socially   No Known Allergies Current Outpatient Prescriptions  Medication Sig Dispense Refill  . atorvastatin (LIPITOR) 40 MG tablet Take 1 tablet (40 mg total) by mouth daily.  90 tablet  3  . cholecalciferol (VITAMIN D) 1000 UNITS tablet Take 2,000 Units by mouth daily.      Marland Kitchen esomeprazole (NEXIUM) 40 MG capsule Take 40 mg by mouth daily before breakfast. As needed      . hydrochlorothiazide (HYDRODIURIL) 25 MG tablet TAKE ONE TABLET BY MOUTH ONCE DAILY  30 tablet  5  . Linaclotide (LINZESS) 145 MCG CAPS capsule Take 145 mcg by mouth as needed.       Marland Kitchen losartan (COZAAR) 100 MG tablet Take 1 tablet (100 mg total) by mouth daily.  90 tablet  3  . Multiple Vitamins-Minerals (CENTRUM SILVER PO) Take 1 tablet by mouth every other day.      Marland Kitchen NIFEdipine (PROCARDIA XL) 30 MG 24 hr tablet Take 1 tablet (30  mg total) by mouth daily.  90 tablet  1  . terazosin (HYTRIN) 5 MG capsule TAKE ONE CAPSULE BY MOUTH AT BEDTIME  30 capsule  5  . warfarin (COUMADIN) 5 MG tablet Take 1 tablet (5 mg total) by mouth daily.  30 tablet  2   No current facility-administered medications for this visit.   REVIEW OF SYSTEMS: Valu.Nieves ] denotes positive finding; [  ] denotes negative finding  CARDIOVASCULAR:  [ ]  chest pain   [ ]  chest pressure   [ ]  palpitations   [ ]  orthopnea   [ ]  dyspnea on exertion   [ ]  claudication   [ ]  rest pain   [ ]  DVT   [ ]  phlebitis PULMONARY:   [ ]  productive cough   [ ]  asthma   [ ]  wheezing NEUROLOGIC:   [ ]  weakness  [ ]  paresthesias  [ ]  aphasia  [ ]  amaurosis  [ ]  dizziness HEMATOLOGIC:   [ ]  bleeding problems   [ ]  clotting disorders MUSCULOSKELETAL:  [ ]  joint pain   [ ]  joint swelling Valu.Nieves ] leg swelling GASTROINTESTINAL: [ ]   blood in  stool  [ ]   hematemesis GENITOURINARY:  [ ]   dysuria  [ ]   hematuria PSYCHIATRIC:  [ ]  history of major depression INTEGUMENTARY:  [ ]  rashes  [ ]  ulcers CONSTITUTIONAL:  [ ]  fever   [ ]  chills  PHYSICAL EXAM: Filed Vitals:   04/01/14 0956  BP: 149/74  Pulse: 82  Temp: 98 F (36.7 C)  TempSrc: Oral  Resp: 16  Height: 5\' 6"  (1.676 m)  Weight: 210 lb (95.255 kg)  SpO2: 98%   Body mass index is 33.91 kg/(m^2). GENERAL: The patient is a well-nourished female, in no acute distress. The vital signs are documented above. CARDIOVASCULAR: There is a regular rate and rhythm. I do not detect carotid bruits. She has palpable popliteal and pedal pulses bilaterally. PULMONARY: There is good air exchange bilaterally without wheezing or rales. ABDOMEN: Soft and non-tender with normal pitched bowel sounds.  MUSCULOSKELETAL: There are no major deformities or cyanosis. NEUROLOGIC: No focal weakness or paresthesias are detected. SKIN: There are no ulcers or rashes noted.she does have some hyperpigmentation adjacent to her right medial malleolus. She has some small spider veins bilaterally. PSYCHIATRIC: The patient has a normal affect.  DATA:  I reviewed her previous duplex scan the results of which are described above.  MEDICAL ISSUES:  DVT, lower extremity, recurrent, right This patient has had recurrent DVTs in the right lower extremity and for this reason is now maintained on Coumadin. I've explained her that she does have evidence of chronic venous insufficiency. I've discussed the importance of intermittent leg elevation the proper positioning for this. In addition I have given her a prescription for knee-high compression stockings with a gradient of 20-30 mm of mercury. I have encouraged her to walk as much as possible. She does belong to silver sne[employer] at the [employer] [facility] [employer] [facility]. I have encouraged her to avoid prolonged sitting and standing. I've asked her to consider water aerobics also which is helpful for  people with venous insufficiency. I have also instructed her to keep her skin well-lubricated in the winter to prevent cracking of the skin which can start an ulcer. She understands that this is a chronic problem. I'll be happy to see her back at any time if she develops new symptoms.   [facility]: [phone]

## 2014-04-24 ENCOUNTER — Telehealth: Payer: Self-pay | Admitting: Family Medicine

## 2014-04-24 ENCOUNTER — Other Ambulatory Visit: Payer: Self-pay | Admitting: *Deleted

## 2014-04-24 MED ORDER — HYDROCHLOROTHIAZIDE 25 MG PO TABS: ORAL_TABLET | ORAL | Status: DC

## 2014-04-24 NOTE — Telephone Encounter (Signed)
Called HCTZ to pharmacy. Has appt with Tammy on Monday. Patient notified

## 2014-04-27 ENCOUNTER — Ambulatory Visit (INDEPENDENT_AMBULATORY_CARE_PROVIDER_SITE_OTHER): Payer: Medicare Other | Admitting: Pharmacist

## 2014-04-27 VITALS — BP 135/77 | HR 72 | Ht 67.0 in | Wt 210.0 lb

## 2014-04-27 DIAGNOSIS — D689 Coagulation defect, unspecified: Secondary | ICD-10-CM

## 2014-04-27 DIAGNOSIS — D7589 Other specified diseases of blood and blood-forming organs: Secondary | ICD-10-CM

## 2014-04-27 DIAGNOSIS — I82409 Acute embolism and thrombosis of unspecified deep veins of unspecified lower extremity: Secondary | ICD-10-CM

## 2014-04-27 DIAGNOSIS — L659 Nonscarring hair loss, unspecified: Secondary | ICD-10-CM

## 2014-04-27 LAB — POCT INR: INR: 2.4

## 2014-04-27 MED ORDER — NIFEDIPINE ER OSMOTIC RELEASE 60 MG PO TB24: 60.0000 mg | ORAL_TABLET | Freq: Every day | ORAL | Status: DC

## 2014-04-27 NOTE — Progress Notes (Addendum)
Patient ID: Renee Pratt, female   DOB: 11-13-1945, 68 y.o.   MRN: [id number] Subjective:    Renee Pratt is a 68 y.o. female who presents for recheck INR and follow up after decreased nifedipine XL from 60 to 30mg  to see if edema improved.   [name] slightly improved but she states that she feels that she has had a few episodes of irregular HR so she has restarted 60mg  of nifedipine XL.    She continues to be concerned about hair loss / alopecia and feels that it is related to come of her medications although she does not feel it is the atrovastatin but something else.    Current Problems (verified) Patient Active Problem List   Diagnosis Date Noted  . Personal history of colonic polyps 02/03/2014  . DVT, lower extremity, recurrent, right 07/21/2013  . Upper abdominal pain 05/20/2013  . Weight gain 05/13/2013  . Constipation - functional 05/13/2013  . GERD (gastroesophageal reflux disease)   . Pain in limb 03/31/2013  . 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 Hyperlipidemia   . Hypertension   . Uterine fibroid   . Blood clotting tendency   . Unspecified vitamin D deficiency 12/24/2012  . Obesity, unspecified 12/24/2012  . Unspecified essential hypertension 10/17/2012  . Other and unspecified hyperlipidemia 10/17/2012     Current Medications (verified) Current Outpatient Prescriptions  Medication Sig Dispense Refill  . atorvastatin (LIPITOR) 40 MG tablet Take 1 tablet (40 mg total) by mouth daily.  90 tablet  3  . cholecalciferol (VITAMIN D) 1000 UNITS tablet Take 2,000 Units by mouth daily.      Marland Kitchen esomeprazole (NEXIUM) 40 MG capsule Take 40 mg by mouth daily before breakfast. As needed      . hydrochlorothiazide (HYDRODIURIL) 25 MG tablet TAKE ONE TABLET BY MOUTH ONCE DAILY  30 tablet  0  . Linaclotide (LINZESS) 145 MCG CAPS capsule Take 145 mcg by mouth as needed.       Marland Kitchen losartan (COZAAR) 100 MG tablet Take 1 tablet (100 mg total) by mouth daily.  90 tablet  3  . Multiple  Vitamins-Minerals (CENTRUM SILVER PO) Take 1 tablet by mouth every other day.      . terazosin (HYTRIN) 5 MG capsule TAKE ONE CAPSULE BY MOUTH AT BEDTIME  30 capsule  5  . warfarin (COUMADIN) 5 MG tablet Take 1 tablet (5 mg total) by mouth daily.  30 tablet  2  . NIFEdipine (PROCARDIA XL) 60 MG 24 hr tablet Take 1 tablet (60 mg total) by mouth daily.  30 tablet  2   No current facility-administered medications for this visit.     Allergies (verified) Review of patient's allergies indicates no known allergies.   History reviewed: allergies, current medications, past family history, past medical history, past social history, past surgical history and problem list   Objective:  Body mass index is 32.88 kg/(m^2). 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  Anticoagulation Dose Instructions as of 04/27/2014     Dorene Grebe Tue Wed Thu Fri Sat   New Dose 5 mg 5 mg  5 mg 5 mg 5 mg 5 mg 5 mg    Description       Continue  5mg  daily - 1 tablet a day.      RTC in 1 month  Cherre Robins, PharmD, CPP   04/29/2014 Looked into possibly causes of hair loss for patient.  Only new med was terazosin which is not usually associated with hair loss.  Possible meds that have caused alopecia - HCTZ, losartan and nexium (all less than 1% incidence reported);  Procardia (1 to 5 % incidence) and warfarin (5% incidence) There have been a few small studies were using CoEnzyme Q10 can help decrease or reverse hair loss associated with warfarin.  Patient would like to try this first.  Follow as planned in 1 month.

## 2014-04-27 NOTE — Patient Instructions (Signed)
Anticoagulation Dose Instructions as of 04/27/2014     Dorene Grebe Tue Wed Thu Fri Sat   New Dose 5 mg 5 mg 5 mg 5 mg 5 mg 5 mg 5 mg    Description       Continue  5mg  daily - 1 tablet a day.      INR was 2.4 today

## 2014-04-29 MED ORDER — COENZYME Q10 100 MG PO TABS: 100.0000 mg | ORAL_TABLET | Freq: Every day | ORAL | Status: DC

## 2014-04-29 NOTE — Addendum Note (Signed)
Addended by: Cherre Robins on: 04/29/2014 04:36 PM   Modules accepted: Orders

## 2014-05-26 ENCOUNTER — Other Ambulatory Visit: Payer: Self-pay | Admitting: Family

## 2014-06-01 ENCOUNTER — Telehealth: Payer: Self-pay | Admitting: Gastroenterology

## 2014-06-01 NOTE — Telephone Encounter (Signed)
Patient called to make OV with SF only. She is aware that SF next available will be JAN 6th. Patient does not want to wait that long because she doesn't think she is passing enough stool with the amount of food she eats and is concerned about the foul order of stool when she blows her nose. She is afraid she is going to get poisoned by her stool if she doesn't see SF ASAP.  Pt is aware that she needs to follow up with SF in January (recall) to have a colonoscopy and has OV on 1/6 at 0930 with SF but she wants to know some recommendations prior to [place]. PLEASE call [phone]

## 2014-06-01 NOTE — Telephone Encounter (Signed)
Pt said she is not taking the Linzess 145 mcg now. It caused too much diarrhea. She is taking 3 stool softners daily and still does not have enough BM output at her BM's. She is concerned about the foul odor of her stool and also her nasal drainage. She said the odor is so bad sometimes that she has to let her car windows down a little.  She wants to know if there is a test she could do. She is aware that Dr. Oneida Alar is on vacation and I will check with one of the extenders.

## 2014-06-02 NOTE — Telephone Encounter (Signed)
Tried to call. Could not leave a message. Samples of Amitiza 8 mcg #16 at front for pick up to take one bid with food.

## 2014-06-02 NOTE — Telephone Encounter (Signed)
I don't know if she has ever tried Amitiza, but I would recommend Amitiza 8 mcg samples to try. 1 po BID.

## 2014-06-11 NOTE — Telephone Encounter (Signed)
Pt is aware and will come tomorrow to pick these up. She is aware we close at noon on Fridays.

## 2014-06-19 ENCOUNTER — Other Ambulatory Visit: Payer: Self-pay | Admitting: Family Medicine

## 2014-06-22 ENCOUNTER — Other Ambulatory Visit: Payer: Self-pay | Admitting: Family

## 2014-06-22 MED ORDER — TERAZOSIN HCL 5 MG PO CAPS: 5.0000 mg | ORAL_CAPSULE | Freq: Every day | ORAL | Status: DC

## 2014-06-22 NOTE — Telephone Encounter (Signed)
done

## 2014-06-22 NOTE — Telephone Encounter (Signed)
Has appt 12/15. Completely out of med since Saturday. Didn't know how many you wanted to refill since has appt tom.

## 2014-06-23 ENCOUNTER — Ambulatory Visit (INDEPENDENT_AMBULATORY_CARE_PROVIDER_SITE_OTHER): Payer: Medicare Other | Admitting: Pharmacist Clinician (PhC)/ Clinical Pharmacy Specialist

## 2014-06-23 DIAGNOSIS — R5382 Chronic fatigue, unspecified: Secondary | ICD-10-CM

## 2014-06-23 DIAGNOSIS — D689 Coagulation defect, unspecified: Secondary | ICD-10-CM

## 2014-06-23 DIAGNOSIS — D7589 Other specified diseases of blood and blood-forming organs: Secondary | ICD-10-CM

## 2014-06-23 DIAGNOSIS — I82409 Acute embolism and thrombosis of unspecified deep veins of unspecified lower extremity: Secondary | ICD-10-CM

## 2014-06-23 LAB — POCT INR: INR: 1.2

## 2014-06-23 NOTE — Progress Notes (Signed)
Patient is still complaining of severe alopecia and wondering if her medications could be causing it.  I reviewed patient's medications with her today.  Her last TSH was in June so I had this re-checked today as well as her B12 level and BMP (her potassium was 3.2 back in June with no re-check- patient having some cramping).  She was a patient of Dr. Jacelyn Grip and is waiting for the new doctor to begin in January to set up care with him.

## 2014-06-23 NOTE — Patient Instructions (Signed)
Anticoagulation Dose Instructions as of 06/23/2014      Dorene Grebe Tue Wed Thu Fri Sat   New Dose 5 mg 5 mg 7.5 mg 7.5 mg 5 mg 5 mg 5 mg    Description        Has been out of warfarin for several days waiting on office to call in refill.  Will have patient take 1 1/2 tablets two days in a row. Then resume regular schedule.

## 2014-06-24 LAB — TSH: TSH: 1.38 u[IU]/mL (ref 0.450–4.500)

## 2014-06-24 LAB — BMP8+EGFR
BUN / CREAT RATIO: 10 — AB (ref 11–26)
BUN: 9 mg/dL (ref 8–27)
CALCIUM: 9.1 mg/dL (ref 8.7–10.3)
CO2: 25 mmol/L (ref 18–29)
CREATININE: 0.88 mg/dL (ref 0.57–1.00)
Chloride: 100 mmol/L (ref 97–108)
GFR calc Af Amer: 79 mL/min/{1.73_m2} (ref >59)
GFR, EST NON AFRICAN AMERICAN: 69 mL/min/{1.73_m2} (ref >59)
Glucose: 99 mg/dL (ref 65–99)
Potassium: 3.6 mmol/L (ref 3.5–5.2)
SODIUM: 143 mmol/L (ref 134–144)

## 2014-06-24 LAB — VITAMIN B12: Vitamin B-12: 375 pg/mL (ref 211–946)

## 2014-06-29 ENCOUNTER — Other Ambulatory Visit: Payer: Self-pay | Admitting: Family Medicine

## 2014-06-29 MED ORDER — HYDROCHLOROTHIAZIDE 25 MG PO TABS: 25.0000 mg | ORAL_TABLET | Freq: Every day | ORAL | Status: DC

## 2014-06-29 NOTE — Telephone Encounter (Signed)
Aware, one month refill on BP meds sent in.

## 2014-07-07 ENCOUNTER — Other Ambulatory Visit: Payer: Self-pay | Admitting: Family Medicine

## 2014-07-14 ENCOUNTER — Telehealth: Payer: Self-pay | Admitting: Family Medicine

## 2014-07-14 NOTE — Telephone Encounter (Signed)
Appointment scheduled for tomorrow at 10:45 with Finger. Patient states she may cancel it if her schedule does not work with appointment.

## 2014-07-15 ENCOUNTER — Ambulatory Visit: Payer: Medicare Other | Admitting: Gastroenterology

## 2014-07-15 ENCOUNTER — Encounter: Payer: Self-pay | Admitting: Family Medicine

## 2014-07-15 ENCOUNTER — Ambulatory Visit (INDEPENDENT_AMBULATORY_CARE_PROVIDER_SITE_OTHER): Payer: Medicare Other | Admitting: Family Medicine

## 2014-07-15 VITALS — BP 145/85 | HR 97 | Temp 97.7°F | Ht 67.0 in | Wt 211.6 lb

## 2014-07-15 DIAGNOSIS — L03039 Cellulitis of unspecified toe: Secondary | ICD-10-CM

## 2014-07-15 MED ORDER — DOXYCYCLINE HYCLATE 100 MG PO TABS: 100.0000 mg | ORAL_TABLET | Freq: Two times a day (BID) | ORAL | Status: DC

## 2014-07-15 NOTE — Progress Notes (Signed)
   Subjective:    Patient ID: Renee Pratt, female    DOB: 1946/04/05, 69 y.o.   MRN: [id number]  HPI Patient is here for right toe discomfort.  She states she injured her right 2nd toe a week ago and she has been soaking it and yesterday she squeezed some purulence from it.  Review of Systems  Constitutional: Negative for fever.  HENT: Negative for ear pain.   Eyes: Negative for discharge.  Respiratory: Negative for cough.   Cardiovascular: Negative for chest pain.  Gastrointestinal: Negative for abdominal distention.  Endocrine: Negative for polyuria.  Genitourinary: Negative for difficulty urinating.  Musculoskeletal: Negative for gait problem and neck pain.  Skin: Negative for color change and rash.  Neurological: Negative for speech difficulty and headaches.  Psychiatric/Behavioral: Negative for agitation.       Objective:    BP 145/85 mmHg  Pulse 97  Temp(Src) 97.7 F (36.5 C) (Oral)  Ht 5\' 7"  (1.702 m)  Wt 211 lb 9.6 oz (95.981 kg)  BMI 33.13 kg/m2 Physical Exam        Assessment & Plan:     ICD-9-CM ICD-10-CM   1. Cellulitis of toe, unspecified laterality 681.10 L03.039 doxycycline (VIBRA-TABS) 100 MG tablet     No Follow-up on file.  Lysbeth Penner FNP

## 2014-07-16 ENCOUNTER — Encounter: Payer: Self-pay | Admitting: Gastroenterology

## 2014-07-16 ENCOUNTER — Ambulatory Visit (INDEPENDENT_AMBULATORY_CARE_PROVIDER_SITE_OTHER): Payer: Medicare Other | Admitting: Gastroenterology

## 2014-07-16 VITALS — BP 132/77 | HR 92 | Temp 97.4°F | Ht 66.0 in | Wt 210.4 lb

## 2014-07-16 DIAGNOSIS — K5904 Chronic idiopathic constipation: Secondary | ICD-10-CM

## 2014-07-16 DIAGNOSIS — Z86010 Personal history of colonic polyps: Secondary | ICD-10-CM | POA: Diagnosis not present

## 2014-07-16 DIAGNOSIS — K5909 Other constipation: Secondary | ICD-10-CM

## 2014-07-16 DIAGNOSIS — K219 Gastro-esophageal reflux disease without esophagitis: Secondary | ICD-10-CM

## 2014-07-16 MED ORDER — LUBIPROSTONE 8 MCG PO CAPS: ORAL_CAPSULE | ORAL | Status: DC

## 2014-07-16 NOTE — Progress Notes (Signed)
cc'ed to pcp °

## 2014-07-16 NOTE — Patient Instructions (Signed)
CONTINUE AMITIZA.  DRINK WATER TO KEEP YOUR URINE LIGHT YELLOW.  FOLLOW A HIGH FIBER DIET. AVOID ITEMS THAT CAUSE BLOATING & GAS. SEE INFO BELOW.  Use Prilosec 30 minutes prior to your first meal.  FOLLOW UP IN 4 MOS.    High-Fiber Diet A high-fiber diet changes your normal diet to include more whole grains, legumes, fruits, and vegetables. Changes in the diet involve replacing refined carbohydrates with unrefined foods. The calorie level of the diet is essentially unchanged. The Dietary Reference Intake (recommended amount) for adult males is 38 grams per day. For adult females, it is 25 grams per day. Pregnant and lactating women should consume 28 grams of fiber per day. Fiber is the intact part of a plant that is not broken down during digestion. Functional fiber is fiber that has been isolated from the plant to provide a beneficial effect in the body. PURPOSE  Increase stool bulk.   Ease and regulate bowel movements.   Lower cholesterol.  INDICATIONS THAT YOU NEED MORE FIBER  Constipation and hemorrhoids.   Uncomplicated diverticulosis (intestine condition) and irritable bowel syndrome.   Weight management.   As a protective measure against hardening of the arteries (atherosclerosis), diabetes, and cancer.   GUIDELINES FOR INCREASING FIBER IN THE DIET  Start adding fiber to the diet slowly. A gradual increase of about 5 more grams (2 slices of whole-wheat bread, 2 servings of most fruits or vegetables, or 1 bowl of high-fiber cereal) per day is best. Too rapid an increase in fiber may result in constipation, flatulence, and bloating.   Drink enough water and fluids to keep your urine clear or pale yellow. Water, juice, or caffeine-free drinks are recommended. Not drinking enough fluid may cause constipation.   Eat a variety of high-fiber foods rather than one type of fiber.   Try to increase your intake of fiber through using high-fiber foods rather than fiber pills or  supplements that contain small amounts of fiber.   The goal is to change the types of food eaten. Do not supplement your present diet with high-fiber foods, but replace foods in your present diet.  INCLUDE A VARIETY OF FIBER SOURCES  Replace refined and processed grains with whole grains, canned fruits with fresh fruits, and incorporate other fiber sources. White rice, white breads, and most bakery goods contain little or no fiber.   Brown whole-grain rice, buckwheat oats, and many fruits and vegetables are all good sources of fiber. These include: broccoli, Brussels sprouts, cabbage, cauliflower, beets, sweet potatoes, white potatoes (skin on), carrots, tomatoes, eggplant, squash, berries, fresh fruits, and dried fruits.   Cereals appear to be the richest source of fiber. Cereal fiber is found in whole grains and bran. Bran is the fiber-rich outer coat of cereal grain, which is largely removed in refining. In whole-grain cereals, the bran remains. In breakfast cereals, the largest amount of fiber is found in those with "bran" in their names. The fiber content is sometimes indicated on the label.   You may need to include additional fruits and vegetables each day.   In baking, for 1 cup white flour, you may use the following substitutions:   1 cup whole-wheat flour minus 2 tablespoons.   1/2 cup white flour plus 1/2 cup whole-wheat flour.

## 2014-07-16 NOTE — Assessment & Plan Note (Signed)
SX CONTROLLED.  CONTINUE TO MONITOR SYMPTOMS. CONTINUE PRILOSEC FOLLOW UP IN 4 MOS.

## 2014-07-16 NOTE — Progress Notes (Signed)
Subjective:    Patient ID: Renee Pratt, female    DOB: 07/22/1945, 69 y.o.   MRN: [id number]  Lysbeth Penner, FNP  HPI CONSTIPATION FAIRLY WELL CONTROLLED WITH AMITIZA 8 MCG BID. LINZESS CAUSED WATER DIARRHEA. STILL TAKING STOOL SOFTENER. ALWAYS FEELS COLD. AFTER EATS MAY HAVE DISCOMFORT IN LUQ.   PT DENIES FEVER, CHILLS, HEMATOCHEZIA, nausea, vomiting OR heartburn or indigestion.  Past Medical History  Diagnosis Date  . Uterine fibroid   . Blood clotting tendency   . DVT, lower extremity, recurrent     Right sided. First time 2011, another in 2012, 03/2013  . 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 Hyperlipidemia   . Hypertension   . GERD (gastroesophageal reflux disease)     hiatal hernia  . Weight gain 05/13/2013  . Constipation 05/13/2013  . Hiatal hernia   . DDD (degenerative disc disease), lumbar   . Uterine fibroid   . Cataract   . Bilateral dry eyes    Past Surgical History  Procedure Laterality Date  . Colposcopy    . Insertion of vena cava filter  01-17-2011    dr pindr # [phone]  [place]   Allergies  Allergen Reactions  . Tetracyclines & Related     Nausea, pain   Current Outpatient Prescriptions  Medication Sig Dispense Refill  . atorvastatin (LIPITOR) 40 MG tablet Take 1 tablet (40 mg total) by mouth daily.    . cholecalciferol (VITAMIN D) 1000 UNITS tablet Take 2,000 Units by mouth daily.    Marland Kitchen docusate sodium (COLACE) 100 MG capsule Take 100 mg by mouth daily as needed for mild constipation.    Marland Kitchen esomeprazole (NEXIUM) 40 MG capsule Take 40 mg by mouth daily before breakfast. As needed    . hydrochlorothiazide (HYDRODIURIL) 25 MG tablet Take 1 tablet (25 mg total) by mouth daily.    Marland Kitchen losartan (COZAAR) 100 MG tablet Take 1 tablet (100 mg total) by mouth daily.    . magnesium hydroxide (MILK OF MAGNESIA) 400 MG/5ML suspension Take by mouth daily as needed for mild constipation.    . Multiple Vitamins-Minerals (CENTRUM SILVER PO) Take 1  tablet by mouth every other day.    Marland Kitchen NIFEdipine (PROCARDIA XL) 60 MG 24 hr tablet Take 1 tablet (60 mg total) by mouth daily.    Marland Kitchen terazosin (HYTRIN) 5 MG capsule Take 1 capsule (5 mg total) by mouth at bedtime.    Marland Kitchen warfarin (COUMADIN) 5 MG tablet Take 1 tablet daily or as directed by anticoagulation clinic.    .      .      .           Review of Systems     Objective:   Physical Exam  Constitutional: She is oriented to person, place, and time. She appears well-developed and well-nourished. No distress.  HENT:  Head: Normocephalic and atraumatic.  Mouth/Throat: Oropharynx is clear and moist. No oropharyngeal exudate.  Eyes: Pupils are equal, round, and reactive to light. No scleral icterus.  Neck: Normal range of motion. Neck supple.  Cardiovascular: Normal rate, regular rhythm and normal heart sounds.   Pulmonary/Chest: Effort normal and breath sounds normal. No respiratory distress.  Abdominal: Soft. Bowel sounds are normal. She exhibits no distension. There is no tenderness.  Musculoskeletal: She exhibits no edema.  Lymphadenopathy:    She has no cervical adenopathy.  Neurological: She is alert and oriented to person, place, and time.  NO  FOCAL DEFICITS   Psychiatric:  SLIGHTLY ANXIOUS MOOD, NL AFFECT   Vitals reviewed.         Assessment & Plan:

## 2014-07-16 NOTE — Progress Notes (Signed)
ON RECALL LIST  °

## 2014-07-16 NOTE — Assessment & Plan Note (Signed)
NO FAM HX: COLON CA/POLYPS IN A FIRST DEGREE RELATIVE. NO HX: ADVANCED POLYP.  NEXT TCS IN JUN 2022. DISCUSSED WITH PT.

## 2014-07-16 NOTE — Assessment & Plan Note (Signed)
Sx controlled with Amitiza 8 mcg bid.  REFILL FOR ONE YEAR DRINK WATER EAT FIBER OTC STOOL SOFTENER PRN FOLLOW UP IN 4 MOS.

## 2014-07-16 NOTE — Telephone Encounter (Signed)
REVIEWED-NO ADDITIONAL RECOMMENDATIONS. 

## 2014-08-10 ENCOUNTER — Encounter (INDEPENDENT_AMBULATORY_CARE_PROVIDER_SITE_OTHER): Payer: Self-pay

## 2014-08-10 ENCOUNTER — Ambulatory Visit (INDEPENDENT_AMBULATORY_CARE_PROVIDER_SITE_OTHER): Payer: Medicare Other | Admitting: Pharmacist

## 2014-08-10 DIAGNOSIS — I82401 Acute embolism and thrombosis of unspecified deep veins of right lower extremity: Secondary | ICD-10-CM | POA: Diagnosis not present

## 2014-08-10 LAB — POCT INR: INR: 2.2

## 2014-08-10 MED ORDER — WARFARIN SODIUM 5 MG PO TABS: ORAL_TABLET | ORAL | Status: DC

## 2014-08-10 MED ORDER — HYDROCHLOROTHIAZIDE 25 MG PO TABS: 25.0000 mg | ORAL_TABLET | Freq: Every day | ORAL | Status: DC

## 2014-08-10 NOTE — Patient Instructions (Signed)
Anticoagulation Dose Instructions as of 08/10/2014      Renee Pratt Tue Wed Thu Fri Sat   New Dose 5 mg 5 mg 5 mg 5 mg 5 mg 5 mg 5 mg    Description        Continue current warfarin dose of 5mg  - take 1 tablet daily      INR was 2.2 today

## 2014-08-17 ENCOUNTER — Ambulatory Visit (INDEPENDENT_AMBULATORY_CARE_PROVIDER_SITE_OTHER): Payer: Medicare Other | Admitting: Family Medicine

## 2014-08-17 ENCOUNTER — Encounter: Payer: Self-pay | Admitting: Family Medicine

## 2014-08-17 VITALS — BP 138/79 | HR 95 | Temp 97.7°F | Ht 66.0 in | Wt 210.0 lb

## 2014-08-17 DIAGNOSIS — F329 Major depressive disorder, single episode, unspecified: Secondary | ICD-10-CM | POA: Diagnosis not present

## 2014-08-17 DIAGNOSIS — F32A Depression, unspecified: Secondary | ICD-10-CM | POA: Insufficient documentation

## 2014-08-17 MED ORDER — DULOXETINE HCL 30 MG PO CPEP: 30.0000 mg | ORAL_CAPSULE | Freq: Every day | ORAL | Status: DC

## 2014-08-17 NOTE — Progress Notes (Signed)
Subjective:  Patient ID: Renee Pratt, female    DOB: Nov 02, 1945  Age: 69 y.o. MRN: [id number]  CC: Depression   HPI Renee Pratt presents for concern about hair loss due to use of terazosin.   Gets anxious with emotional pain. Away from family in [place] and in [place]. Lonely. Doesn't like her home . Home is always cold. Plans to move. Living in [place]. Crying a lot. Planning a move to [place]. Her son lives there. Difficulty concentrating. Sad, tearful. Irritable. Withdrawn. Disinterested inn social outlets in spite of stating she is lonely.  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   She has past surgical history that includes Colposcopy and Insertion of vena cava filter (01-17-2011).   Her family history includes AAA (abdominal aortic aneurysm) in her mother; Cancer (age of onset: 52) in her father; Colon cancer in her cousin; Depression in her maternal grandmother; Hypertension in her mother; Kidney disease in her mother; Other in her cousin; Stroke (age of onset: 79) in her mother. There is no history of Clotting disorder or Colon polyps.She reports that she quit smoking about 16 years ago. She has never used smokeless tobacco. She reports that she drinks alcohol. She reports that she does not use illicit drugs.  Current Outpatient Prescriptions on File Prior to Visit  Medication Sig Dispense Refill  . atorvastatin (LIPITOR) 40 MG tablet Take 1 tablet (40 mg total) by mouth daily. 90 tablet 3  . cholecalciferol (VITAMIN D) 1000 UNITS tablet Take 2,000 Units by mouth daily.    Marland Kitchen docusate sodium (COLACE) 100 MG capsule Take 100 mg by mouth daily as needed  for mild constipation.    . hydrochlorothiazide (HYDRODIURIL) 25 MG tablet Take 1 tablet (25 mg total) by mouth daily. 30 tablet 0  . losartan (COZAAR) 100 MG tablet Take 1 tablet (100 mg total) by mouth daily. 90 tablet 3  . magnesium hydroxide (MILK OF MAGNESIA) 400 MG/5ML suspension Take by mouth daily as needed for mild constipation.    . Multiple Vitamins-Minerals (CENTRUM SILVER PO) Take 1 tablet by mouth every other day.    Marland Kitchen NIFEdipine (PROCARDIA XL) 60 MG 24 hr tablet Take 1 tablet (60 mg total) by mouth daily. 30 tablet 2  . warfarin (COUMADIN) 5 MG tablet Take 1 tablet daily or as directed by anticoagulation clinic. 90 tablet 0  . terazosin (HYTRIN) 5 MG capsule Take 1 capsule (5 mg total) by mouth at bedtime. (Patient not taking: Reported on 08/17/2014) 30 capsule 0   No current facility-administered medications on file prior to visit.    ROS Review of Systems  Constitutional: Negative for fever, chills, diaphoresis, appetite change, fatigue and unexpected weight change.  HENT: Negative for congestion, ear pain, hearing loss, postnasal drip, rhinorrhea, sneezing, sore throat and trouble swallowing.   Eyes: Negative for pain.  Respiratory: Negative for cough, chest tightness and shortness of breath.   Cardiovascular: Negative for chest pain and palpitations.  Gastrointestinal: Negative for nausea, vomiting, abdominal pain, diarrhea and constipation.  Genitourinary: Negative for dysuria, frequency and menstrual problem.  Musculoskeletal: Negative for joint swelling and arthralgias.  Skin: Negative for rash.  Neurological: Negative for dizziness, weakness, numbness and headaches.  Psychiatric/Behavioral: Negative for dysphoric mood and agitation.  Objective:  BP 138/79 mmHg  Pulse 95  Temp(Src) 97.7 F (36.5 C) (Oral)  Ht 5\' 6"  (1.676 m)  Wt 210 lb (95.255 kg)  BMI 33.91 kg/m2  Physical Exam  Constitutional: She is oriented to person, place, and time. She appears  well-developed and well-nourished. No distress.  HENT:  Head: Normocephalic and atraumatic.  Right Ear: External ear normal.  Left Ear: External ear normal.  Nose: Nose normal.  Mouth/Throat: Oropharynx is clear and moist.  Eyes: Conjunctivae and EOM are normal. Pupils are equal, round, and reactive to light.  Neck: Normal range of motion. Neck supple. No thyromegaly present.  Cardiovascular: Normal rate, regular rhythm and normal heart sounds.   No murmur heard. Pulmonary/Chest: Effort normal and breath sounds normal. No respiratory distress. She has no wheezes. She has no rales.  Abdominal: Soft. Bowel sounds are normal. She exhibits no distension. There is no tenderness.  Lymphadenopathy:    She has no cervical adenopathy.  Neurological: She is alert and oriented to person, place, and time. She has normal reflexes.  Skin: Skin is warm and dry.  Psychiatric: She has a normal mood and affect. Her behavior is normal. Judgment and thought content normal.    Assessment & Plan:   Renee Pratt was seen today for depression.  Diagnoses and associated orders for this visit:  Depression  Other Orders - DULoxetine (CYMBALTA) 30 MG capsule; Take 1 capsule (30 mg total) by mouth daily.   I have discontinued Renee Pratt's esomeprazole, Coenzyme Q10, and lubiprostone. I am also having her start on DULoxetine. Additionally, I am having her maintain her Multiple Vitamins-Minerals (CENTRUM SILVER PO), cholecalciferol, losartan, atorvastatin, NIFEdipine, terazosin, docusate sodium, magnesium hydroxide, warfarin, and hydrochlorothiazide.  Meds ordered this encounter  Medications  . DULoxetine (CYMBALTA) 30 MG capsule    Sig: Take 1 capsule (30 mg total) by mouth daily.    Dispense:  30 capsule    Refill:  0   `  Follow-up: Return in about 1 month (around 09/15/2014).  Claretta Fraise, M.D.

## 2014-08-18 ENCOUNTER — Telehealth: Payer: Self-pay | Admitting: Family Medicine

## 2014-08-18 MED ORDER — VENLAFAXINE HCL ER 75 MG PO CP24: 75.0000 mg | ORAL_CAPSULE | Freq: Every day | ORAL | Status: DC

## 2014-08-18 NOTE — Telephone Encounter (Signed)
Please send in a prescription for venlafaxine 75 mg once daily #30, 2 refills. then call and let the patient know that the substitution has been made.

## 2014-08-18 NOTE — Telephone Encounter (Signed)
Pt notified of RX change

## 2014-09-07 ENCOUNTER — Telehealth: Payer: Self-pay | Admitting: Pharmacist

## 2014-09-07 MED ORDER — NIFEDIPINE ER OSMOTIC RELEASE 60 MG PO TB24: 60.0000 mg | ORAL_TABLET | Freq: Every day | ORAL | Status: DC

## 2014-09-07 NOTE — Telephone Encounter (Signed)
done

## 2014-09-25 ENCOUNTER — Telehealth: Payer: Self-pay | Admitting: Pharmacist

## 2014-09-25 NOTE — Telephone Encounter (Signed)
Patient called to reschedule missed INR recheck.   Appt scheduled for 09/29/14 at 1:30om

## 2014-09-29 ENCOUNTER — Ambulatory Visit (INDEPENDENT_AMBULATORY_CARE_PROVIDER_SITE_OTHER): Payer: Medicare Other | Admitting: Pharmacist Clinician (PhC)/ Clinical Pharmacy Specialist

## 2014-09-29 DIAGNOSIS — I82401 Acute embolism and thrombosis of unspecified deep veins of right lower extremity: Secondary | ICD-10-CM

## 2014-09-29 LAB — POCT INR: INR: 1.6

## 2014-09-29 NOTE — Patient Instructions (Signed)
Anticoagulation Dose Instructions as of 09/29/2014      Renee Pratt Tue Wed Thu Fri Sat   New Dose 5 mg 5 mg 5 mg 5 mg 5 mg 5 mg 5 mg    Description        Take 1 1/2 tablets today and tomorrow.  Then resume 1 tablet a day

## 2014-10-12 ENCOUNTER — Other Ambulatory Visit: Payer: Self-pay | Admitting: Family Medicine

## 2014-10-12 NOTE — Telephone Encounter (Signed)
done

## 2014-11-05 ENCOUNTER — Ambulatory Visit (INDEPENDENT_AMBULATORY_CARE_PROVIDER_SITE_OTHER): Payer: Medicare Other | Admitting: Pharmacist

## 2014-11-05 ENCOUNTER — Encounter: Payer: Self-pay | Admitting: Gastroenterology

## 2014-11-05 VITALS — BP 150/80

## 2014-11-05 DIAGNOSIS — I82401 Acute embolism and thrombosis of unspecified deep veins of right lower extremity: Secondary | ICD-10-CM | POA: Diagnosis not present

## 2014-11-05 LAB — POCT INR: INR: 2.6

## 2014-11-05 MED ORDER — VALSARTAN 320 MG PO TABS: 320.0000 mg | ORAL_TABLET | Freq: Every day | ORAL | Status: DC

## 2014-11-05 NOTE — Patient Instructions (Signed)
Anticoagulation Dose Instructions as of 11/05/2014      Renee Pratt Tue Wed Thu Fri Sat   New Dose 5 mg 5 mg 5 mg 5 mg 5 mg 5 mg 5 mg    Description        Continue 1 tablet a day.

## 2014-11-05 NOTE — Progress Notes (Signed)
Patient has stopped trazosin due to suspected it was causing hair loss.  She reports that hair is growing better now by BP was elevated in office today.  She use to take Diovan with good results abe insurance coverage made her change to losartan about 3 years ago.   BP was 150/80 today.  Change losartan to valsartan 320mg  1 tablet daily .  RTC in 1 month to check BP

## 2014-11-09 ENCOUNTER — Telehealth: Payer: Self-pay | Admitting: Family Medicine

## 2014-11-09 ENCOUNTER — Telehealth: Payer: Self-pay | Admitting: *Deleted

## 2014-11-09 MED ORDER — HYDROCHLOROTHIAZIDE 25 MG PO TABS: 25.0000 mg | ORAL_TABLET | Freq: Every day | ORAL | Status: DC

## 2014-11-09 NOTE — Telephone Encounter (Signed)
Wrong acct °

## 2014-11-09 NOTE — Telephone Encounter (Signed)
done

## 2014-12-02 ENCOUNTER — Other Ambulatory Visit: Payer: Self-pay | Admitting: Pharmacist

## 2014-12-08 ENCOUNTER — Ambulatory Visit (INDEPENDENT_AMBULATORY_CARE_PROVIDER_SITE_OTHER): Payer: Medicare Other | Admitting: Family Medicine

## 2014-12-08 ENCOUNTER — Encounter: Payer: Self-pay | Admitting: Family Medicine

## 2014-12-08 VITALS — BP 134/76 | HR 81 | Temp 97.8°F | Ht 66.0 in | Wt 210.8 lb

## 2014-12-08 DIAGNOSIS — E785 Hyperlipidemia, unspecified: Secondary | ICD-10-CM

## 2014-12-08 DIAGNOSIS — Z9889 Other specified postprocedural states: Secondary | ICD-10-CM | POA: Diagnosis not present

## 2014-12-08 DIAGNOSIS — Z0000 Encounter for general adult medical examination without abnormal findings: Secondary | ICD-10-CM

## 2014-12-08 DIAGNOSIS — Z86010 Personal history of colonic polyps: Secondary | ICD-10-CM | POA: Diagnosis not present

## 2014-12-08 DIAGNOSIS — Z95828 Presence of other vascular implants and grafts: Secondary | ICD-10-CM

## 2014-12-08 DIAGNOSIS — Z1239 Encounter for other screening for malignant neoplasm of breast: Secondary | ICD-10-CM | POA: Diagnosis not present

## 2014-12-08 DIAGNOSIS — I82401 Acute embolism and thrombosis of unspecified deep veins of right lower extremity: Secondary | ICD-10-CM | POA: Diagnosis not present

## 2014-12-08 DIAGNOSIS — E559 Vitamin D deficiency, unspecified: Secondary | ICD-10-CM

## 2014-12-08 DIAGNOSIS — Z23 Encounter for immunization: Secondary | ICD-10-CM

## 2014-12-08 DIAGNOSIS — Z136 Encounter for screening for cardiovascular disorders: Secondary | ICD-10-CM | POA: Diagnosis not present

## 2014-12-08 DIAGNOSIS — I10 Essential (primary) hypertension: Secondary | ICD-10-CM | POA: Diagnosis not present

## 2014-12-08 LAB — POCT CBC
GRANULOCYTE PERCENT: 52.2 % (ref 37–80)
HEMATOCRIT: 41.2 % (ref 37.7–47.9)
HEMOGLOBIN: 12.9 g/dL (ref 12.2–16.2)
Lymph, poc: 1.9 (ref 0.6–3.4)
MCH, POC: 27.7 pg (ref 27–31.2)
MCHC: 31.4 g/dL — AB (ref 31.8–35.4)
MCV: 88.2 fL (ref 80–97)
MPV: 8.6 fL (ref 0–99.8)
POC Granulocyte: 2.3 (ref 2–6.9)
POC LYMPH PERCENT: 42.9 %L (ref 10–50)
Platelet Count, POC: 211 10*3/uL (ref 142–424)
RBC: 4.67 M/uL (ref 4.04–5.48)
RDW, POC: 15.7 %
WBC: 4.5 10*3/uL — AB (ref 4.6–10.2)

## 2014-12-08 NOTE — Progress Notes (Signed)
Subjective:  Patient ID: Renee Pratt, female    DOB: 07-20-1945  Age: 69 y.o. MRN: [id number]  CC: Annual Exam   HPI Renee Pratt presents for Physical exam. Has swelling right LE. Wearing TED hose. Hx DVT has implanted Vena Cava filter. Coumadin as below monitored by Pharmacist. Would like Colonoscopy updated hx of polyp. Also Fhx of AAA would like new eval. Due Pap after January 22, 2015. None abnormal.  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   She has past surgical history that includes Colposcopy and Insertion of vena cava filter (01-17-2011).   Her family history includes AAA (abdominal aortic aneurysm) in her mother; Cancer (age of onset: 13) in her father; Colon cancer in her cousin; Depression in her maternal grandmother; Hypertension in her mother; Kidney disease in her mother; Other in her cousin; Stroke (age of onset: 42) in her mother. There is no history of Clotting disorder or Colon polyps.She reports that she quit smoking about 17 years ago. She has never used smokeless tobacco. She reports that she drinks alcohol. She reports that she does not use illicit drugs.  Outpatient Prescriptions Prior to Visit  Medication Sig Dispense Refill  . atorvastatin (LIPITOR) 40 MG tablet Take 1 tablet (40 mg total) by mouth daily. 90 tablet 3  . cholecalciferol (VITAMIN D) 1000 UNITS tablet Take 2,000 Units by mouth daily.    Marland Kitchen docusate sodium (COLACE) 100 MG capsule Take 100 mg by mouth daily as needed for mild constipation.    . hydrochlorothiazide (HYDRODIURIL) 25 MG tablet TAKE ONE TABLET BY MOUTH ONCE DAILY (NEED OFFICE VISIT) 30 tablet 2  .  magnesium hydroxide (MILK OF MAGNESIA) 400 MG/5ML suspension Take by mouth daily as needed for mild constipation.    . Multiple Vitamins-Minerals (CENTRUM SILVER PO) Take 1 tablet by mouth every other day.    Marland Kitchen NIFEdipine (PROCARDIA XL) 60 MG 24 hr tablet Take 1 tablet (60 mg total) by mouth daily. 30 tablet 2  . valsartan (DIOVAN) 320 MG tablet Take 1 tablet (320 mg total) by mouth daily. 30 tablet 1  . warfarin (COUMADIN) 5 MG tablet TAKE ONE TABLET BY MOUTH ONCE DAILY OR AS DIRECTED BY ANTICOAGULATION CLINIC 90 tablet 0  . DULoxetine (CYMBALTA) 30 MG capsule Take 1 capsule (30 mg total) by mouth daily. (Patient not taking: Reported on 12/08/2014) 30 capsule 0  . hydrochlorothiazide (HYDRODIURIL) 25 MG tablet Take 1 tablet (25 mg total) by mouth daily. (Patient not taking: Reported on 12/08/2014) 30 tablet 0  . venlafaxine XR (EFFEXOR XR) 75 MG 24 hr capsule Take 1 capsule (75 mg total) by mouth daily with breakfast. (Patient not taking: Reported on 12/08/2014) 30 capsule 2   No facility-administered medications prior to visit.    ROS Review of Systems  Constitutional: Negative for fever, chills, diaphoresis, appetite change, fatigue and unexpected weight change.  HENT: Negative for congestion, ear pain, hearing loss, postnasal drip, rhinorrhea, sneezing, sore throat and trouble swallowing.   Eyes: Negative for pain.  Respiratory: Negative for cough, chest tightness and shortness of breath.   Cardiovascular: Positive for leg swelling. Negative for chest pain and palpitations.  Gastrointestinal: Negative for nausea, vomiting, abdominal pain, diarrhea and constipation.  Genitourinary: Negative for dysuria, urgency, frequency, decreased  urine volume, vaginal bleeding and menstrual problem.  Musculoskeletal: Negative for myalgias, back pain, joint swelling and arthralgias.  Skin: Negative for rash.  Neurological: Negative for dizziness, syncope, weakness, numbness and headaches.    Psychiatric/Behavioral: Negative for dysphoric mood and agitation.    Objective:  BP 134/76 mmHg  Pulse 81  Temp(Src) 97.8 F (36.6 C) (Oral)  Ht 5' 6"  (1.676 m)  Wt 210 lb 12.8 oz (95.618 kg)  BMI 34.04 kg/m2  BP Readings from Last 3 Encounters:  12/08/14 134/76  11/05/14 150/80  08/17/14 138/79    Wt Readings from Last 3 Encounters:  12/08/14 210 lb 12.8 oz (95.618 kg)  08/17/14 210 lb (95.255 kg)  07/16/14 210 lb 6.4 oz (95.437 kg)     Physical Exam  Constitutional: She is oriented to person, place, and time. She appears well-developed and well-nourished. No distress.  HENT:  Head: Normocephalic and atraumatic.  Right Ear: External ear normal.  Left Ear: External ear normal.  Nose: Nose normal.  Mouth/Throat: Oropharynx is clear and moist.  Eyes: Conjunctivae and EOM are normal. Pupils are equal, round, and reactive to light.  Neck: Normal range of motion. Neck supple. No thyromegaly present.  Cardiovascular: Normal rate, regular rhythm and normal heart sounds.   No murmur heard. Pulmonary/Chest: Effort normal and breath sounds normal. No respiratory distress. She has no wheezes. She has no rales.  Abdominal: Soft. Bowel sounds are normal. She exhibits no distension. There is no tenderness.  Lymphadenopathy:    She has no cervical adenopathy.  Neurological: She is alert and oriented to person, place, and time. She has normal reflexes.  Skin: Skin is warm and dry.  Psychiatric: She has a normal mood and affect. Her behavior is normal. Judgment and thought content normal.    No results found for: HGBA1C  Lab Results  Component Value Date   WBC 5.0 06/07/2013   HGB 12.7 06/07/2013   HCT 38.4 06/07/2013   PLT 225 06/07/2013   GLUCOSE 99 06/23/2014   CHOL 231* 12/18/2013   TRIG 106 12/18/2013   HDL 54 12/18/2013   LDLCALC 156* 12/18/2013   ALT 11 12/18/2013   AST 15 12/18/2013   NA 143 06/23/2014   K 3.6 06/23/2014   CL 100 06/23/2014   CREATININE 0.88  06/23/2014   BUN 9 06/23/2014   CO2 25 06/23/2014   TSH 1.380 06/23/2014   INR 2.6 11/05/2014    Dg Abd Acute W/chest  06/07/2013   CLINICAL DATA:  Constipation  EXAM: ACUTE ABDOMEN SERIES (ABDOMEN 2 VIEW & CHEST 1 VIEW)  COMPARISON:  Chest radiograph 05/29/2013. CT abdomen pelvis 05/29/2013  FINDINGS: There is no evidence of dilated bowel loops or free intraperitoneal air. No significant stool burden. Calcifications in the patient's pelvis, in the midline and to the right of midline, are consistent with calcified uterine fibroids is seen on recent CT abdomen and pelvis. No radiopaque calculi or other significant radiographic abnormality is seen. Heart size and mediastinal contours are within normal limits. Both lungs are clear. An IVC filter is seen to the right of midline at L3-L4.  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 Mild thoracolumbar scoliosis appears stable.  IMPRESSION: 1. Nonobstructive bowel gas pattern.  No significant stool burden. 2. No acute cardiopulmonary disease.   Electronically Signed   By: Curlene Dolphin M.D.   On: 06/07/2013 19:22    Assessment & Plan:   Renee Pratt was seen today for annual exam.  Diagnoses  and all orders for this visit:  History of colonic polyps Orders: -     [facility] COLONOSCOPY  Screening breast examination Orders: -     MM Digital Screening; Future  Screening for AAA (abdominal aortic aneurysm) Orders: -     [place], retroperitnl abd,  ltd  DVT, lower extremity, recurrent, right Orders: -     POCT CBC -     CMP14+EGFR -     POCT INR  Routine general medical examination at a health care facility Orders: -     POCT CBC -     CMP14+EGFR  Essential hypertension Orders: -     POCT CBC -     CMP14+EGFR -     POCT UA - Microscopic Only -     POCT urinalysis dipstick  Hyperlipidemia Orders: -     POCT CBC -     CMP14+EGFR -     NMR, lipoprofile  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 Swigart's DULoxetine and venlafaxine XR. I am also having her maintain her Multiple Vitamins-Minerals (CENTRUM SILVER PO), cholecalciferol, atorvastatin, docusate sodium, magnesium hydroxide, NIFEdipine, hydrochlorothiazide, valsartan, and warfarin.  No orders of the defined types were placed in this encounter.     Follow-up: Return in about 6 months (around 06/09/2015).  Claretta Fraise, M.D.

## 2014-12-08 NOTE — Addendum Note (Signed)
Addended by: Earlene Plater on: 12/08/2014 04:33 PM   Modules accepted: Miquel Dunn

## 2014-12-09 LAB — CMP14+EGFR
A/G RATIO: 1.7 (ref 1.1–2.5)
ALK PHOS: 81 IU/L (ref 39–117)
ALT: 13 IU/L (ref 0–32)
AST: 14 IU/L (ref 0–40)
Albumin: 4.5 g/dL (ref 3.6–4.8)
BILIRUBIN TOTAL: 0.2 mg/dL (ref 0.0–1.2)
BUN / CREAT RATIO: 10 — AB (ref 11–26)
BUN: 9 mg/dL (ref 8–27)
CO2: 30 mmol/L — ABNORMAL HIGH (ref 18–29)
CREATININE: 0.86 mg/dL (ref 0.57–1.00)
Calcium: 9.7 mg/dL (ref 8.7–10.3)
Chloride: 99 mmol/L (ref 97–108)
GFR calc Af Amer: 81 mL/min/{1.73_m2} (ref >59)
GFR, EST NON AFRICAN AMERICAN: 70 mL/min/{1.73_m2} (ref >59)
GLOBULIN, TOTAL: 2.7 g/dL (ref 1.5–4.5)
Glucose: 94 mg/dL (ref 65–99)
POTASSIUM: 3.6 mmol/L (ref 3.5–5.2)
Sodium: 143 mmol/L (ref 134–144)
Total Protein: 7.2 g/dL (ref 6.0–8.5)

## 2014-12-09 LAB — NMR, LIPOPROFILE
CHOLESTEROL: 226 mg/dL — AB (ref 100–199)
HDL CHOLESTEROL BY NMR: 49 mg/dL (ref >39)
HDL PARTICLE NUMBER: 35.1 umol/L (ref >=30.5)
LDL PARTICLE NUMBER: 2013 nmol/L — AB (ref <1000)
LDL SIZE: 21.5 nm (ref >20.5)
LDL-C: 148 mg/dL — ABNORMAL HIGH (ref 0–99)
LP-IR Score: 55 — ABNORMAL HIGH (ref <=45)
Small LDL Particle Number: 826 nmol/L — ABNORMAL HIGH (ref <=527)
TRIGLYCERIDES BY NMR: 145 mg/dL (ref 0–149)

## 2014-12-09 LAB — VITAMIN D 25 HYDROXY (VIT D DEFICIENCY, FRACTURES): Vit D, 25-Hydroxy: 41.5 ng/mL (ref 30.0–100.0)

## 2014-12-10 ENCOUNTER — Other Ambulatory Visit: Payer: Self-pay | Admitting: Family Medicine

## 2014-12-10 MED ORDER — ROSUVASTATIN CALCIUM 20 MG PO TABS
20.0000 mg | ORAL_TABLET | Freq: Every day | ORAL | Status: DC
Start: 2014-12-10 — End: 2014-12-28

## 2014-12-14 ENCOUNTER — Other Ambulatory Visit: Payer: Self-pay

## 2014-12-14 DIAGNOSIS — R933 Abnormal findings on diagnostic imaging of other parts of digestive tract: Secondary | ICD-10-CM

## 2014-12-16 NOTE — Addendum Note (Signed)
Addended by: Marin Olp on: 12/16/2014 10:23 AM   Modules accepted: Orders, SmartSet

## 2014-12-21 ENCOUNTER — Telehealth: Payer: Self-pay | Admitting: Gastroenterology

## 2014-12-21 NOTE — Telephone Encounter (Signed)
I will await reply from [facility]

## 2014-12-21 NOTE — Telephone Encounter (Signed)
[place] her. She saw SF on 07/16/2014 and has changed her mind and wants to switch to [facility]. Patient is wanting to have a colonoscopy because she hasn't had one since 2012 (scanned in her chart) and is saying she is past due and is needing another one due to her history of polyps per patient. She said she wasn't pleased with SF because SF told her she didn't need one until 2020. I told the patient that she was NIC'ed on our recall list that she was coming due for a colonoscopy in July 2017 per SF, but patient said that she was still having problems since seeing SF in January and felt she needed a colonoscopy ASAP that she knows her body and her GI records from her previous doctor [place] said that its recommended that she have one done every 3 years and she was past due. I told her that it would be up to RMR if he would take on anymore new patients because he and SF don't see each others patients but I would let him decide, but also informed patient that he was out of town for 2 weeks and she could also if needed get her PCP to refer her to another GI doctor if she wasn't happy with SF and if [facility] wasn't able to take her. Patient agreed. Please advise what you want to do.

## 2014-12-21 NOTE — Telephone Encounter (Signed)
REVIEWED. For [facility] TO REVIEW AS WELL. I WILL NO LONGER BE PT'S GI DOCTOR.

## 2014-12-28 ENCOUNTER — Ambulatory Visit (INDEPENDENT_AMBULATORY_CARE_PROVIDER_SITE_OTHER): Payer: Medicare Other | Admitting: Family Medicine

## 2014-12-28 ENCOUNTER — Encounter (INDEPENDENT_AMBULATORY_CARE_PROVIDER_SITE_OTHER): Payer: Self-pay

## 2014-12-28 VITALS — BP 150/81 | HR 89 | Temp 97.6°F | Ht 66.0 in | Wt 207.8 lb

## 2014-12-28 DIAGNOSIS — R1012 Left upper quadrant pain: Secondary | ICD-10-CM | POA: Diagnosis not present

## 2014-12-28 DIAGNOSIS — K5900 Constipation, unspecified: Secondary | ICD-10-CM

## 2014-12-28 NOTE — Progress Notes (Signed)
Subjective:  Patient ID: Renee Pratt, female    DOB: 1946-01-01  Age: 69 y.o. MRN: [id number]  CC: Constipation; Abdominal Pain; and Nausea   HPI Renee Pratt presents for sensation of being bloated and not having bowel movements regularly. She feels discomfort primarily in the left upper quadrant at the costal margin She is having bowel movements every 2-3 days. She has to use a laxity of each time. She's tried multiple accidents with what she feels is poor success. She's also been taking stool softeners and fiber. She has mild diffuse abdominal pain. She is frustrated because she doesn't want to go back to see her previous GI doctor. She had a bad experience with him. We are trying to refer her to a another practice but insurance is calling that a second opinion and approval has not as yet been obtained.  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   She has past surgical history that includes Colposcopy and Insertion of vena cava filter (01-17-2011).   Her family history includes AAA (abdominal aortic aneurysm) in her mother; Cancer (age of onset: 44) in her father; Colon cancer in her cousin; Depression in her maternal grandmother; Hypertension in her mother; Kidney disease in her mother; Other in her cousin; Stroke (age of onset: 39) in her mother. There is no history of Clotting disorder or Colon polyps.She reports that she quit smoking about 17 years ago. She has never used smokeless tobacco. She reports that she drinks alcohol. She reports that she does not use illicit drugs.  Outpatient Prescriptions Prior to Visit  Medication Sig Dispense Refill  .  cholecalciferol (VITAMIN D) 1000 UNITS tablet Take 2,000 Units by mouth daily.    Marland Kitchen docusate sodium (COLACE) 100 MG capsule Take 100 mg by mouth daily as needed for mild constipation.    . hydrochlorothiazide (HYDRODIURIL) 25 MG tablet TAKE ONE TABLET BY MOUTH ONCE DAILY (NEED OFFICE VISIT) 30 tablet 2  . magnesium hydroxide (MILK OF MAGNESIA) 400 MG/5ML suspension Take by mouth daily as needed for mild constipation.    . Multiple Vitamins-Minerals (CENTRUM SILVER PO) Take 1 tablet by mouth every other day.    Marland Kitchen NIFEdipine (PROCARDIA XL) 60 MG 24 hr tablet Take 1 tablet (60 mg total) by mouth daily. 30 tablet 2  . valsartan (DIOVAN) 320 MG tablet Take 1 tablet (320 mg total) by mouth daily. 30 tablet 1  . warfarin (COUMADIN) 5 MG tablet TAKE ONE TABLET BY MOUTH ONCE DAILY OR AS DIRECTED BY ANTICOAGULATION CLINIC 90 tablet 0  . rosuvastatin (CRESTOR) 20 MG tablet Take 1 tablet (20 mg total) by mouth daily. 90 tablet 3   No facility-administered medications prior to visit.    ROS Review of Systems  Constitutional: Negative for fever, chills, diaphoresis, appetite change, fatigue and unexpected weight change.  HENT: Negative for congestion, sore throat and trouble swallowing.   Eyes: Negative for pain.  Respiratory: Negative for cough, chest tightness and shortness of breath.   Cardiovascular: Negative for chest pain and palpitations.  Gastrointestinal: Positive for abdominal pain and abdominal distention. Negative for nausea, vomiting and diarrhea.  Genitourinary: Negative for dysuria, frequency and menstrual problem.  Musculoskeletal: Negative for joint swelling and arthralgias.  Skin: Negative for rash.  Neurological: Negative  for dizziness, weakness, numbness and headaches.  Psychiatric/Behavioral: Negative for dysphoric mood and agitation.    Objective:  BP 150/81 mmHg  Pulse 89  Temp(Src) 97.6 F (36.4 C) (Oral)  Ht 5\' 6"  (1.676 m)  Wt 207 lb 12.8 oz (94.257 kg)  BMI 33.56  kg/m2  BP Readings from Last 3 Encounters:  12/28/14 150/81  12/08/14 134/76  11/05/14 150/80    Wt Readings from Last 3 Encounters:  12/28/14 207 lb 12.8 oz (94.257 kg)  12/08/14 210 lb 12.8 oz (95.618 kg)  08/17/14 210 lb (95.255 kg)     Physical Exam  Constitutional: She is oriented to person, place, and time. She appears well-developed and well-nourished. No distress.  HENT:  Head: Normocephalic and atraumatic.  Right Ear: External ear normal.  Left Ear: External ear normal.  Nose: Nose normal.  Mouth/Throat: Oropharynx is clear and moist.  Eyes: Conjunctivae and EOM are normal. Pupils are equal, round, and reactive to light.  Neck: Normal range of motion. Neck supple. No thyromegaly present.  Cardiovascular: Normal rate, regular rhythm and normal heart sounds.   No murmur heard. Pulmonary/Chest: Effort normal and breath sounds normal. No respiratory distress. She has no wheezes. She has no rales.  Abdominal: Soft. Bowel sounds are normal. She exhibits distension (There is increased bowel gas on percussion). She exhibits no mass. There is tenderness (Mild, diffuse, slightly more at LUQ). There is no rebound and no guarding.  Lymphadenopathy:    She has no cervical adenopathy.  Neurological: She is alert and oriented to person, place, and time. She has normal reflexes.  Skin: Skin is warm and dry.  Psychiatric: She has a normal mood and affect. Her behavior is normal. Judgment and thought content normal.    No results found for: HGBA1C  Lab Results  Component Value Date   WBC 4.5* 12/08/2014   HGB 12.9 12/08/2014   HCT 41.2 12/08/2014   PLT 225 06/07/2013   GLUCOSE 94 12/08/2014   CHOL 226* 12/08/2014   TRIG 145 12/08/2014   HDL 49 12/08/2014   LDLCALC 156* 12/18/2013   ALT 13 12/08/2014   AST 14 12/08/2014   NA 143 12/08/2014   K 3.6 12/08/2014   CL 99 12/08/2014   CREATININE 0.86 12/08/2014   BUN 9 12/08/2014   CO2 30* 12/08/2014   TSH 1.380 06/23/2014    INR 2.6 11/05/2014    Dg Abd Acute W/chest  06/07/2013   CLINICAL DATA:  Constipation  EXAM: ACUTE ABDOMEN SERIES (ABDOMEN 2 VIEW & CHEST 1 VIEW)  COMPARISON:  Chest radiograph 05/29/2013. CT abdomen pelvis 05/29/2013  FINDINGS: There is no evidence of dilated bowel loops or free intraperitoneal air. No significant stool burden. Calcifications in the patient's pelvis, in the midline and to the right of midline, are consistent with calcified uterine fibroids is seen on recent CT abdomen and pelvis. No radiopaque calculi or other significant radiographic abnormality is seen. Heart size and mediastinal contours are within normal limits. Both lungs are clear. An IVC filter is seen to the right of midline at L3-L4.  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 Mild thoracolumbar scoliosis appears stable.  IMPRESSION: 1. Nonobstructive bowel gas pattern.  No significant stool burden. 2. No acute cardiopulmonary disease.   Electronically Signed   By: Curlene Dolphin M.D.   On: 06/07/2013 19:22    Assessment & Plan:   Ayssa was seen today for constipation, abdominal pain and nausea.  Diagnoses and all orders  for this visit:  LUQ abdominal pain Orders: -     CT Abdomen Pelvis W Contrast; Future  Constipation, unspecified constipation type   I have discontinued Ms. Bazin's rosuvastatin. I am also having her maintain her Multiple Vitamins-Minerals (CENTRUM SILVER PO), cholecalciferol, docusate sodium, magnesium hydroxide, NIFEdipine, hydrochlorothiazide, valsartan, and warfarin.  No orders of the defined types were placed in this encounter.     Follow-up: Return in about 2 weeks (around 01/11/2015).  Claretta Fraise, M.D.

## 2014-12-28 NOTE — Telephone Encounter (Signed)
Patient called again this morning to follow up if [facility] would be able to take her as a new patient. She is having problems with her bowels and having pains going across her back. I told her that [facility] was still on vacation and since she was not happy with [facility] and wanted to switch her care that I could not make an OV at this time until [facility] makes a decision. I advised her to go to the ED or call her PCP. She said that she was going to the ED in [place].

## 2014-12-29 DIAGNOSIS — K5900 Constipation, unspecified: Secondary | ICD-10-CM | POA: Diagnosis not present

## 2014-12-30 ENCOUNTER — Encounter: Payer: Self-pay | Admitting: Family Medicine

## 2015-01-04 ENCOUNTER — Ambulatory Visit (INDEPENDENT_AMBULATORY_CARE_PROVIDER_SITE_OTHER): Payer: Medicare Other | Admitting: Pharmacist

## 2015-01-04 ENCOUNTER — Encounter (INDEPENDENT_AMBULATORY_CARE_PROVIDER_SITE_OTHER): Payer: Self-pay

## 2015-01-04 DIAGNOSIS — I82401 Acute embolism and thrombosis of unspecified deep veins of right lower extremity: Secondary | ICD-10-CM

## 2015-01-04 LAB — POCT INR: INR: 1.8

## 2015-01-04 NOTE — Patient Instructions (Signed)
Anticoagulation Dose Instructions as of 01/04/2015      Dorene Grebe Tue Wed Thu Fri Sat   New Dose 5 mg 5 mg 5 mg 5 mg 5 mg 5 mg 5 mg    Description        Take 1 and 1/2 tablets today then restart usual dose of warfarin 5mg  1 tablet daily.     INR was 1.8 today

## 2015-01-05 NOTE — Telephone Encounter (Signed)
no

## 2015-01-05 NOTE — Telephone Encounter (Signed)
Patient informed and stated she will be seeing a GI doctor in [place] and I can cancel all her future appointments here.

## 2015-01-07 ENCOUNTER — Ambulatory Visit (HOSPITAL_COMMUNITY)
Admission: RE | Admit: 2015-01-07 | Discharge: 2015-01-07 | Disposition: A | Discharge status: Home / Self Care | Payer: Medicare Other | Source: Ambulatory Visit | Attending: Family Medicine | Admitting: Family Medicine

## 2015-01-07 DIAGNOSIS — N281 Cyst of kidney, acquired: Secondary | ICD-10-CM | POA: Diagnosis not present

## 2015-01-07 DIAGNOSIS — K5900 Constipation, unspecified: Secondary | ICD-10-CM | POA: Diagnosis not present

## 2015-01-07 DIAGNOSIS — R1012 Left upper quadrant pain: Secondary | ICD-10-CM | POA: Diagnosis not present

## 2015-01-07 DIAGNOSIS — D259 Leiomyoma of uterus, unspecified: Secondary | ICD-10-CM | POA: Diagnosis not present

## 2015-01-07 MED ORDER — IOHEXOL 300 MG/ML  SOLN
100.0000 mL | Freq: Once | INTRAMUSCULAR | Status: AC | PRN
  Administered 2015-01-07: 100 mL via INTRAVENOUS

## 2015-01-24 ENCOUNTER — Other Ambulatory Visit: Payer: Self-pay | Admitting: Family

## 2015-02-08 ENCOUNTER — Ambulatory Visit (INDEPENDENT_AMBULATORY_CARE_PROVIDER_SITE_OTHER): Payer: Medicare Other | Admitting: Pharmacist

## 2015-02-08 DIAGNOSIS — I82401 Acute embolism and thrombosis of unspecified deep veins of right lower extremity: Secondary | ICD-10-CM

## 2015-02-08 LAB — POCT INR: INR: 1.6

## 2015-02-08 MED ORDER — VALSARTAN 320 MG PO TABS: 320.0000 mg | ORAL_TABLET | Freq: Every day | ORAL | Status: DC

## 2015-02-08 NOTE — Patient Instructions (Signed)
Anticoagulation Dose Instructions as of 02/08/2015      Dorene Grebe Tue Wed Thu Fri Sat   New Dose 5 mg 7.5 mg 5 mg 5 mg 5 mg 5 mg 5 mg    Description        Increase warfarin 5mg  tablet to 1 and 1/2 tablets on mondays and 1 tablet all other days.     INR was 1.6 today

## 2015-02-17 ENCOUNTER — Other Ambulatory Visit: Payer: Self-pay | Admitting: Family Medicine

## 2015-02-17 DIAGNOSIS — Z780 Asymptomatic menopausal state: Secondary | ICD-10-CM

## 2015-02-25 ENCOUNTER — Other Ambulatory Visit: Payer: Self-pay | Admitting: Family Medicine

## 2015-03-01 ENCOUNTER — Other Ambulatory Visit: Payer: Medicare Other

## 2015-03-01 ENCOUNTER — Encounter: Payer: Self-pay | Admitting: Pharmacist

## 2015-03-04 ENCOUNTER — Ambulatory Visit (INDEPENDENT_AMBULATORY_CARE_PROVIDER_SITE_OTHER): Payer: Medicare Other | Admitting: Pharmacist

## 2015-03-04 DIAGNOSIS — I82401 Acute embolism and thrombosis of unspecified deep veins of right lower extremity: Secondary | ICD-10-CM | POA: Diagnosis not present

## 2015-03-04 LAB — POCT INR: INR: 2

## 2015-03-04 NOTE — Patient Instructions (Signed)
Anticoagulation Dose Instructions as of 03/04/2015      Dorene Grebe Tue Wed Thu Fri Sat   New Dose 5 mg 7.5 mg 5 mg 5 mg 5 mg 5 mg 5 mg    Description        Continue current dose of warfarin 5mg  tablet to 1 and 1/2 tablets on mondays and 1 tablet all other days.     INR was 2.0 today

## 2015-03-16 ENCOUNTER — Other Ambulatory Visit: Payer: Self-pay | Admitting: Family Medicine

## 2015-03-18 ENCOUNTER — Ambulatory Visit (INDEPENDENT_AMBULATORY_CARE_PROVIDER_SITE_OTHER): Payer: Medicare Other | Admitting: Pediatrics

## 2015-03-18 ENCOUNTER — Encounter: Payer: Self-pay | Admitting: Pediatrics

## 2015-03-18 VITALS — BP 151/84 | HR 97 | Temp 97.5°F | Ht 66.0 in | Wt 210.4 lb

## 2015-03-18 DIAGNOSIS — I82401 Acute embolism and thrombosis of unspecified deep veins of right lower extremity: Secondary | ICD-10-CM

## 2015-03-18 DIAGNOSIS — E669 Obesity, unspecified: Secondary | ICD-10-CM | POA: Diagnosis not present

## 2015-03-18 DIAGNOSIS — I10 Essential (primary) hypertension: Secondary | ICD-10-CM | POA: Diagnosis not present

## 2015-03-18 DIAGNOSIS — K5909 Other constipation: Secondary | ICD-10-CM | POA: Diagnosis not present

## 2015-03-18 DIAGNOSIS — K5904 Chronic idiopathic constipation: Secondary | ICD-10-CM

## 2015-03-18 NOTE — Assessment & Plan Note (Signed)
Started going to silver sneakers again, goes apprx 3 days a week. Eating a lot of sugar now multiple times a day (liquorice, fruit snacks, sugar with coffee, juice, soda on Fridays), she is planning to try to cut back. Will RTC in 2 months to reassess progress.

## 2015-03-18 NOTE — Assessment & Plan Note (Addendum)
Ongoing problem. Goes several days between stools. Takes a stool softener OTC qod, one laxative pill every few days. Start miralax, 1 packet a day and titrate as needed. Start fiber daily. Goal 1-2 soft stools/day.

## 2015-03-18 NOTE — Assessment & Plan Note (Addendum)
Slightly elevated today. Recheck still 140s/80s. Has not taken her meds yet today. Continue current medicines, take daily in the morning.

## 2015-03-18 NOTE — Assessment & Plan Note (Addendum)
On warfarin, continue. Last INR therapeutic, has appt with Tammy for recheck soon per pt.

## 2015-03-18 NOTE — Patient Instructions (Signed)
Keep going with silver sneakers, good work!  Try miralax half to one whole packet once a day at first, can increase it to in the morning and the afternoon if you are not having 1-2 soft bowel movements every day.  Eat fiber every day.

## 2015-03-18 NOTE — Progress Notes (Signed)
Subjective:    Patient ID: Renee Pratt, female    DOB: 1945-08-06, 69 y.o.   MRN: [id number]  HPI: Renee Pratt is a 69 y.o. female presenting on 03/18/2015 for follow up of HTN,   Overall feeling ok, cant eat as many vegetables as she wants because of warfarin. Has history of constipation. Feels bloated after eating, having bowel movements every few days. Tried linzess in past, caused liquid stools. Now on laxative and stool softener intermittently. No blood in stool.   2012 had had VC filter placed for recurrent DVT. First blood clot was in 2008. Had second clot then had filter placed. Now also on warfarin. Filter never removed, she was told it couldn't be removed because there for so long. 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 Has good energy, staying busy with exercise and ministry. No headaches, weakness, fatigue.  Colonoscopy: polyps in 2012, due now, scheduled for [place] in a few weeks. Mammogram: due, never had abnormal Pap smear: normal with HPV in 01/2013.  Relevant past medical, surgical, family and social history reviewed and updated as indicated. Interim medical history since our last visit reviewed. Allergies and medications reviewed and updated.   ROS: Per HPI unless specifically indicated above  Past Medical History Patient Active Problem List   Diagnosis Date Noted  . Depression 08/17/2014  . DVT, lower extremity, recurrent, right 07/21/2013  . Constipation - functional 05/13/2013  . GERD (gastroesophageal reflux disease)   . 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 Blood clotting tendency   . Unspecified vitamin D deficiency 12/24/2012  . Obesity 12/24/2012  . Essential hypertension 10/17/2012  . Other and unspecified hyperlipidemia 10/17/2012    Current Outpatient Prescriptions  Medication Sig Dispense Refill  . cholecalciferol (VITAMIN D) 1000 UNITS  tablet Take 2,000 Units by mouth daily.    Marland Kitchen docusate sodium (COLACE) 100 MG capsule Take 100 mg by mouth daily as needed for mild constipation.    . hydrochlorothiazide (HYDRODIURIL) 25 MG tablet Take 1 po qd 30 tablet 2  . losartan (COZAAR) 100 MG tablet TAKE ONE TABLET BY MOUTH ONCE DAILY 90 tablet 0  . magnesium hydroxide (MILK OF MAGNESIA) 400 MG/5ML suspension Take by mouth daily as needed for mild constipation.    . Multiple Vitamins-Minerals (CENTRUM SILVER PO) Take 1 tablet by mouth every other day.    Marland Kitchen NIFEdipine (PROCARDIA XL) 60 MG 24 hr tablet Take 1 tablet (60 mg total) by mouth daily. 30 tablet 2  . valsartan (DIOVAN) 320 MG tablet Take 1 tablet (320 mg total) by mouth daily. 30 tablet 1  . warfarin (COUMADIN) 5 MG tablet TAKE ONE TABLET BY MOUTH ONCE DAILY OR  AS  DIRECTED  BY  CLINIC 90 tablet 0   No current facility-administered medications for this visit.       Objective:    BP 151/84 mmHg  Pulse 97  Temp(Src) 97.5 F (36.4 C) (Oral)  Ht 5\' 6"  (1.676 m)  Wt 210 lb 6.4 oz (95.437 kg)  BMI 33.98 kg/m2  Wt Readings from Last 3 Encounters:  03/18/15 210 lb 6.4 oz (95.437 kg)  12/28/14 207 lb 12.8 oz (94.257 kg)  12/08/14 210 lb 12.8 oz (95.618 kg)    Gen: NAD, alert, cooperative with exam, NCAT EYES: EOMI, no scleral injection or icterus ENT:  OP without erythema LYMPH: no cervical  LAD CV: NRRR, normal S1/S2, no murmur, DP pulses 2+ b/l Resp: CTABL, no wheezes, normal WOB Abd: +BS, soft, NTND. no guarding, no masses felt though limited exam due to body habitus SKIN: warm, dry. Well-circumscribed area of hyperpigmentation over R heel.  Neuro: Alert and oriented, strength equal b/l UE and LE, coordination grossly normal MSK: normal muscle bulk     Assessment & Plan:   69yoF here for follow up of below medical problems.  Constipation - functional Ongoing problem. Goes several days between stools. Takes a stool softener OTC qod, one laxative pill every few  days. Start miralax, 1 packet a day and titrate as needed. Start fiber daily. Goal 1-2 soft stools/day.    DVT, lower extremity, recurrent, right On warfarin, continue. Last INR therapeutic, has appt with Tammy for recheck soon per pt.   Essential hypertension Slightly elevated today. Recheck still 140s/80s. Has not taken her meds yet today. Continue current medicines, take daily in the morning.  Obesity Started going to silver sneakers again, goes apprx 3 days a week. Eating a lot of sugar now multiple times a day (liquorice, fruit snacks, sugar with coffee, juice, soda on Fridays), she is planning to try to cut back. Will RTC in 2 months to reassess progress.     Follow up plan: Return in about 8 weeks (around 05/13/2015) for also needs mammogram.  [name], MD [facility] 03/18/2015, 12:09 PM

## 2015-03-26 ENCOUNTER — Ambulatory Visit: Payer: Self-pay

## 2015-04-15 ENCOUNTER — Ambulatory Visit (INDEPENDENT_AMBULATORY_CARE_PROVIDER_SITE_OTHER): Payer: Medicare Other | Admitting: Pediatrics

## 2015-04-15 ENCOUNTER — Encounter: Payer: Self-pay | Admitting: Pediatrics

## 2015-04-15 VITALS — BP 139/83 | HR 81 | Temp 98.5°F | Ht 66.0 in | Wt 208.6 lb

## 2015-04-15 DIAGNOSIS — Z1211 Encounter for screening for malignant neoplasm of colon: Secondary | ICD-10-CM | POA: Diagnosis not present

## 2015-04-15 DIAGNOSIS — K5900 Constipation, unspecified: Secondary | ICD-10-CM

## 2015-04-15 MED ORDER — POLYETHYLENE GLYCOL 3350 17 GM/SCOOP PO POWD: 1.0000 | Freq: Once | ORAL | Status: DC

## 2015-04-15 NOTE — Patient Instructions (Addendum)
Take the 255g of miralax over several hours. Drink lots of fluids while on it. Just eat broth while doing the clean out. Next day after clear bowel movements, start one packet of miralax every day in the morning. Keep taking stool softener 100mg  morning and night every day.  If you dont have a bowel movement in 24hours, take an extra dose of miralax.

## 2015-04-15 NOTE — Progress Notes (Signed)
Subjective:    Patient ID: Renee Pratt, female    DOB: 1945-07-27, 69 y.o.   MRN: [id number]  CC: colon packet  HPI: Renee Pratt is a 69 y.o. female presenting on 04/15/2015 for Colon Study  Pain in LUQ  Long history of infrequent bowel movements, has felt much better in past after clean outs No early satiety, no weight loss. HAs had some weight gain per her report. Hard stools now. Takes stool softener every other day. Interested in colon packet from [facility]. Due for colonoscopy, doesn't want to travel to [place] where she was previously planning on going for repeat. No dark or bloody stools.   Relevant past medical, surgical, family and social history reviewed and updated as indicated. Interim medical history since our last visit reviewed. Allergies and medications reviewed and updated.   ROS: Per HPI unless specifically indicated above  Past Medical History Patient Active Problem List   Diagnosis Date Noted  . Depression 08/17/2014  . DVT, lower extremity, recurrent, right ([place]) 07/21/2013  . Constipation - functional 05/13/2013  . GERD (gastroesophageal reflux disease)   . 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 Blood clotting tendency   . Unspecified vitamin D deficiency 12/24/2012  . Obesity 12/24/2012  . Essential hypertension 10/17/2012  . Other and unspecified hyperlipidemia 10/17/2012    Current Outpatient Prescriptions  Medication Sig Dispense Refill  . cholecalciferol (VITAMIN D) 1000 UNITS tablet Take 2,000 Units by mouth daily.    Marland Kitchen docusate sodium (COLACE) 100 MG capsule Take 100 mg by mouth daily as needed for mild constipation.    . hydrochlorothiazide (HYDRODIURIL) 25 MG tablet Take 1 po qd 30 tablet 2  . losartan (COZAAR) 100 MG tablet TAKE ONE TABLET BY MOUTH ONCE DAILY 90 tablet 0  . magnesium hydroxide (MILK OF MAGNESIA) 400 MG/5ML suspension Take by mouth daily as needed for mild constipation.    . Multiple Vitamins-Minerals (CENTRUM SILVER PO) Take 1  tablet by mouth every other day.    Marland Kitchen NIFEdipine (PROCARDIA XL) 60 MG 24 hr tablet Take 1 tablet (60 mg total) by mouth daily. 30 tablet 2  . valsartan (DIOVAN) 320 MG tablet Take 1 tablet (320 mg total) by mouth daily. 30 tablet 1  . warfarin (COUMADIN) 5 MG tablet TAKE ONE TABLET BY MOUTH ONCE DAILY OR  AS  DIRECTED  BY  CLINIC 90 tablet 0  . polyethylene glycol powder (GLYCOLAX/MIRALAX) powder Take 255 g by mouth once. 255 g 0   No current facility-administered medications for this visit.       Objective:    BP 139/83 mmHg  Pulse 81  Temp(Src) 98.5 F (36.9 C) (Oral)  Ht 5\' 6"  (1.676 m)  Wt 208 lb 9.6 oz (94.62 kg)  BMI 33.68 kg/m2  Wt Readings from Last 3 Encounters:  04/15/15 208 lb 9.6 oz (94.62 kg)  03/18/15 210 lb 6.4 oz (95.437 kg)  12/28/14 207 lb 12.8 oz (94.257 kg)     Gen: NAD, alert, cooperative with exam, NCAT EYES: EOMI, no scleral injection or icterus CV: NRRR, normal S1/S2, no murmur, DP pulses 2+ b/l Resp: CTABL, no wheezes, normal WOB Abd: +BS, soft, NTND. no guarding or organomegaly, no masses. Ext: No edema, warm Neuro: Alert and oriented    Assessment & Plan:   [name] was seen today for colonoscopy discussion and constipation.  Diagnoses and all orders for this visit:  Constipation, unspecified constipation type Will do clean out, then cont to take  daily miralax, BID stool softener. -     polyethylene glycol powder (GLYCOLAX/MIRALAX) powder; Take 255 g by mouth once.  Screening for colon cancer Last 4 years ago, was supposed to have 3 yr repeat per pt. -     Ambulatory referral to Gastroenterology  Follow up plan: Return in about 3 months (around 07/16/2015).  [name], MD [facility] 04/15/2015, 12:58 PM

## 2015-04-16 ENCOUNTER — Ambulatory Visit: Payer: Self-pay | Admitting: Pharmacist

## 2015-04-16 MED ORDER — PEG 3350-KCL-NA BICARB-NACL 420 G PO SOLR: 4000.0000 mL | Freq: Once | ORAL | Status: DC

## 2015-04-16 NOTE — Addendum Note (Signed)
Addended by: Eustaquio Maize on: 04/16/2015 02:57 PM   Modules accepted: Orders, Medications

## 2015-04-20 ENCOUNTER — Encounter (INDEPENDENT_AMBULATORY_CARE_PROVIDER_SITE_OTHER): Payer: Self-pay | Admitting: *Deleted

## 2015-04-23 ENCOUNTER — Encounter: Payer: Medicare Other | Admitting: Pharmacist

## 2015-04-27 ENCOUNTER — Ambulatory Visit (INDEPENDENT_AMBULATORY_CARE_PROVIDER_SITE_OTHER): Payer: Medicare Other | Admitting: Pharmacist Clinician (PhC)/ Clinical Pharmacy Specialist

## 2015-04-27 ENCOUNTER — Other Ambulatory Visit: Payer: Self-pay | Admitting: Family Medicine

## 2015-04-27 DIAGNOSIS — I82401 Acute embolism and thrombosis of unspecified deep veins of right lower extremity: Secondary | ICD-10-CM | POA: Diagnosis not present

## 2015-04-27 LAB — POCT INR: INR: 2.3

## 2015-05-06 ENCOUNTER — Ambulatory Visit (HOSPITAL_COMMUNITY): Payer: Medicare Other | Admitting: Psychiatry

## 2015-05-07 ENCOUNTER — Ambulatory Visit (HOSPITAL_COMMUNITY): Payer: Medicare Other | Admitting: Psychology

## 2015-05-20 ENCOUNTER — Ambulatory Visit (INDEPENDENT_AMBULATORY_CARE_PROVIDER_SITE_OTHER): Payer: Medicare Other | Admitting: Pediatrics

## 2015-05-20 ENCOUNTER — Encounter: Payer: Self-pay | Admitting: Pediatrics

## 2015-05-20 VITALS — BP 163/86 | HR 82 | Temp 97.1°F | Ht 66.0 in | Wt 209.2 lb

## 2015-05-20 DIAGNOSIS — M79672 Pain in left foot: Secondary | ICD-10-CM | POA: Diagnosis not present

## 2015-05-20 DIAGNOSIS — M2142 Flat foot [pes planus] (acquired), left foot: Secondary | ICD-10-CM | POA: Diagnosis not present

## 2015-05-20 DIAGNOSIS — D7589 Other specified diseases of blood and blood-forming organs: Secondary | ICD-10-CM | POA: Diagnosis not present

## 2015-05-20 DIAGNOSIS — Z23 Encounter for immunization: Secondary | ICD-10-CM

## 2015-05-20 DIAGNOSIS — I82401 Acute embolism and thrombosis of unspecified deep veins of right lower extremity: Secondary | ICD-10-CM

## 2015-05-20 DIAGNOSIS — M7061 Trochanteric bursitis, right hip: Secondary | ICD-10-CM | POA: Diagnosis not present

## 2015-05-20 DIAGNOSIS — L821 Other seborrheic keratosis: Secondary | ICD-10-CM | POA: Diagnosis not present

## 2015-05-20 DIAGNOSIS — M2141 Flat foot [pes planus] (acquired), right foot: Secondary | ICD-10-CM | POA: Diagnosis not present

## 2015-05-20 DIAGNOSIS — D689 Coagulation defect, unspecified: Secondary | ICD-10-CM

## 2015-05-20 DIAGNOSIS — M79661 Pain in right lower leg: Secondary | ICD-10-CM | POA: Insufficient documentation

## 2015-05-20 DIAGNOSIS — M7062 Trochanteric bursitis, left hip: Secondary | ICD-10-CM

## 2015-05-20 DIAGNOSIS — M79671 Pain in right foot: Secondary | ICD-10-CM

## 2015-05-20 NOTE — Progress Notes (Signed)
Subjective:    Patient ID: Renee Pratt, female    DOB: 12/23/1945, 69 y.o.   MRN: [id number]  CC: leg pain, skin lesion  HPI: Renee Pratt is a 69 y.o. female presenting on 05/20/2015 for Lesion on ankle; Leg Pain; and Hip Pain  Feels like her ankles are rolling in more, sometimes has pain in her heels Used to have normal arches, now has flat feet. Has been ongoing for months Pain comes and goes Has not tried anything for the pain Not recently had the heel pain Is thinking about getting new shoes Wears slip on clogs with no arch support now  Fleshy part of R calf aches when she lays down on her R side to sleep Doesn't hurt on L side Doesn't hurt during the day R side is the side that she had the blood clots on in the past  Also having R and L sided "hip" pain, points to her greater trochanter pain when she is sleeping Has not tried anything for the pain  Takes two aleve when her knees are bothering her. It helps her pain for two days. No recent knee pain.  No fevers No LE swelling Has been taking her warfarin as prescribed, eating some more vegetables as per plan with pharmacist  Has noticed some spots on her R leg. No itching, not bleeding, she is not sure how long they have been there. Does not think they are growing, though only just noticed them.  Relevant past medical, surgical, family and social history reviewed and updated as indicated. Interim medical history since our last visit reviewed. Allergies and medications reviewed and updated.   ROS: All systems negative other than what is in HPI  Past Medical History Patient Active Problem List   Diagnosis Date Noted  . Heel pain, bilateral 05/20/2015  . Acquired bilateral flat feet 05/20/2015  . Trochanteric bursitis of both hips 05/20/2015  . Tenderness of right calf 05/20/2015  . Depression 08/17/2014  . DVT, lower extremity, recurrent, right ([place]) 07/21/2013  . Constipation - functional 05/13/2013    . GERD (gastroesophageal reflux disease)   . 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 Blood clotting tendency   . Unspecified vitamin D deficiency 12/24/2012  . Obesity 12/24/2012  . Essential hypertension 10/17/2012  . Other and unspecified hyperlipidemia 10/17/2012    Current Outpatient Prescriptions  Medication Sig Dispense Refill  . cholecalciferol (VITAMIN D) 1000 UNITS tablet Take 2,000 Units by mouth daily.    Marland Kitchen docusate sodium (COLACE) 100 MG capsule Take 100 mg by mouth daily as needed for mild constipation.    . hydrochlorothiazide (HYDRODIURIL) 25 MG tablet Take 1 po qd 30 tablet 2  . losartan (COZAAR) 100 MG tablet TAKE ONE TABLET BY MOUTH ONCE DAILY 90 tablet 1  . magnesium hydroxide (MILK OF MAGNESIA) 400 MG/5ML suspension Take by mouth daily as needed for mild constipation.    . Multiple Vitamins-Minerals (CENTRUM SILVER PO) Take 1 tablet by mouth every other day.    Marland Kitchen NIFEdipine (PROCARDIA XL) 60 MG 24 hr tablet Take 1 tablet (60 mg total) by mouth daily. 30 tablet 2  . valsartan (DIOVAN) 320 MG tablet Take 1 tablet (320 mg total) by mouth daily. 30 tablet 1  . warfarin (COUMADIN) 5 MG tablet TAKE ONE TABLET BY MOUTH ONCE DAILY OR  AS  DIRECTED  BY  CLINIC 90 tablet 0   No current facility-administered medications for this visit.  Objective:    BP 163/86 mmHg  Pulse 82  Temp(Src) 97.1 F (36.2 C) (Oral)  Ht 5\' 6"  (1.676 m)  Wt 209 lb 3.2 oz (94.892 kg)  BMI 33.78 kg/m2  Wt Readings from Last 3 Encounters:  05/20/15 209 lb 3.2 oz (94.892 kg)  04/15/15 208 lb 9.6 oz (94.62 kg)  03/18/15 210 lb 6.4 oz (95.437 kg)     Gen: NAD, alert, cooperative with exam, NCAT EYES: EOMI, no scleral injection or icterus ENT:  OP without erythema CV: NRRR, normal S1/S2, no murmur, distal pulses 2+ b/l Resp: CTABL, no wheezes, normal WOB Abd: +BS, soft, NTND. no guarding or organomegaly Ext: No edema, warm, calf circumference different between R and L less than 1/4 in. No calf  tenderness b/l. DP pulses 2+ b/l.  Neuro: Alert and oriented, strength equal b/l UE and LE, sensation intact b/l, coordination grossly normal MSK: No tenderness with palpation over greater trochanteric bursa, no point tenderness over heels b/l. Minimal mid-foot arch present with and without weight bearing, some pronation of ankles b/l. Normal ROM int/external rotation at hips b/l, no pain.     Assessment & Plan:    Renee Pratt was seen today for lesion on ankle, leg pain and hip pain.  Diagnoses and all orders for this visit:  Trochanteric bursitis of both hips Normal exam today, no tenderness over bursa. Normal hip ROM. Likely the cause of her pain when she is sleeping on her side. Will try repositioning with pillows.  Encounter for immunization  DVT hx on warfarin. No swelling in calves.   Heel pain, bilateral  Acquired bilateral flat feet wear shoes with backs and arch supports.   Tenderness of right calf On warfarin as above, not tender now, only when she is puts pressure on it. Trial of aleve.  Seborrheic keratosis Will let me know if itches or worsens.  Other orders -     Flu Vaccine QUAD 36+ mos IM   Follow up plan: 2 months  [name], MD [place] 05/20/2015, 2:09 PM

## 2015-05-27 DIAGNOSIS — Z86711 Personal history of pulmonary embolism: Secondary | ICD-10-CM | POA: Diagnosis not present

## 2015-05-27 DIAGNOSIS — M62830 Muscle spasm of back: Secondary | ICD-10-CM | POA: Diagnosis not present

## 2015-05-27 DIAGNOSIS — Z86718 Personal history of other venous thrombosis and embolism: Secondary | ICD-10-CM | POA: Diagnosis not present

## 2015-05-27 DIAGNOSIS — M546 Pain in thoracic spine: Secondary | ICD-10-CM | POA: Diagnosis not present

## 2015-05-27 DIAGNOSIS — Z87891 Personal history of nicotine dependence: Secondary | ICD-10-CM | POA: Diagnosis not present

## 2015-05-27 DIAGNOSIS — R05 Cough: Secondary | ICD-10-CM | POA: Diagnosis not present

## 2015-05-27 DIAGNOSIS — M549 Dorsalgia, unspecified: Secondary | ICD-10-CM | POA: Diagnosis not present

## 2015-06-02 ENCOUNTER — Other Ambulatory Visit: Payer: Self-pay | Admitting: Family Medicine

## 2015-06-07 ENCOUNTER — Ambulatory Visit (INDEPENDENT_AMBULATORY_CARE_PROVIDER_SITE_OTHER): Payer: Medicare Other | Admitting: Pharmacist

## 2015-06-07 ENCOUNTER — Encounter: Payer: Self-pay | Admitting: Pharmacist

## 2015-06-07 DIAGNOSIS — I82401 Acute embolism and thrombosis of unspecified deep veins of right lower extremity: Secondary | ICD-10-CM

## 2015-06-07 LAB — POCT INR: INR: 1.5

## 2015-06-07 MED ORDER — WARFARIN SODIUM 5 MG PO TABS: ORAL_TABLET | ORAL | Status: DC

## 2015-06-07 NOTE — Patient Instructions (Signed)
Anticoagulation Dose Instructions as of 06/07/2015      Renee Pratt Tue Wed Thu Fri Sat   New Dose 5 mg 7.5 mg 5 mg 5 mg 5 mg 7.5 mg 5 mg    Description        Take 1 and 1/2 tablets tomorrow (06/08/2015). Then take warfarin 1 and 1/2 tablets (7.5mg ) on mondays and fridays and 1 tablet all other days.      INR today was 1.5

## 2015-06-08 ENCOUNTER — Ambulatory Visit (HOSPITAL_COMMUNITY): Payer: Medicare Other | Admitting: Psychology

## 2015-06-24 ENCOUNTER — Encounter: Payer: Self-pay | Admitting: Pharmacist

## 2015-06-28 ENCOUNTER — Ambulatory Visit (INDEPENDENT_AMBULATORY_CARE_PROVIDER_SITE_OTHER): Payer: Medicare Other | Admitting: Pharmacist

## 2015-06-28 ENCOUNTER — Encounter: Payer: Self-pay | Admitting: Pharmacist

## 2015-06-28 ENCOUNTER — Other Ambulatory Visit: Payer: Self-pay | Admitting: Family Medicine

## 2015-06-28 VITALS — BP 138/74 | HR 72

## 2015-06-28 DIAGNOSIS — I82401 Acute embolism and thrombosis of unspecified deep veins of right lower extremity: Secondary | ICD-10-CM

## 2015-06-28 LAB — POCT INR: INR: 2.3

## 2015-06-28 MED ORDER — NIFEDIPINE ER OSMOTIC RELEASE 60 MG PO TB24
60.0000 mg | ORAL_TABLET | Freq: Every day | ORAL | Status: DC
Start: 2015-06-28 — End: 2015-10-10

## 2015-06-28 NOTE — Patient Instructions (Signed)
Anticoagulation Dose Instructions as of 06/28/2015      Dorene Grebe Tue Wed Thu Fri Sat   New Dose 5 mg 7.5 mg 5 mg 5 mg 5 mg 7.5 mg 5 mg    Description        Continue current warfarin dose of 1 and 1/2 tablets (7.5mg ) on mondays and fridays and 1 tablet all other days.     INR was 2.3 today

## 2015-07-06 ENCOUNTER — Telehealth: Payer: Self-pay | Admitting: Pediatrics

## 2015-07-06 DIAGNOSIS — R87629 Unspecified abnormal cytological findings in specimens from vagina: Secondary | ICD-10-CM

## 2015-07-06 NOTE — Telephone Encounter (Signed)
Patient requesting recommendation for gynecologist

## 2015-07-07 NOTE — Telephone Encounter (Signed)
Called patient back she states that she is just wanting to have a yearly exam.  She has not had a pap in two years.

## 2015-07-07 NOTE — Telephone Encounter (Signed)
Does she want to see the gynecologist for incontinence?

## 2015-07-08 NOTE — Telephone Encounter (Signed)
H/o abnormal pap smears within last 10 years, was followed in [place]. Wants to establish care with gyn down here. Referral placed.

## 2015-07-15 DIAGNOSIS — H2513 Age-related nuclear cataract, bilateral: Secondary | ICD-10-CM | POA: Diagnosis not present

## 2015-07-15 DIAGNOSIS — H04123 Dry eye syndrome of bilateral lacrimal glands: Secondary | ICD-10-CM | POA: Diagnosis not present

## 2015-07-15 DIAGNOSIS — H40003 Preglaucoma, unspecified, bilateral: Secondary | ICD-10-CM | POA: Diagnosis not present

## 2015-07-19 ENCOUNTER — Telehealth: Payer: Self-pay | Admitting: Pediatrics

## 2015-07-19 DIAGNOSIS — Z1211 Encounter for screening for malignant neoplasm of colon: Secondary | ICD-10-CM

## 2015-07-20 NOTE — Telephone Encounter (Signed)
Please address

## 2015-07-21 NOTE — Telephone Encounter (Signed)
Referral placed for GI, needs screening colonoscopy

## 2015-07-23 ENCOUNTER — Ambulatory Visit (INDEPENDENT_AMBULATORY_CARE_PROVIDER_SITE_OTHER): Payer: Medicare Other | Admitting: Adult Health

## 2015-07-23 ENCOUNTER — Other Ambulatory Visit (HOSPITAL_COMMUNITY)
Admission: RE | Admit: 2015-07-23 | Discharge: 2015-07-23 | Disposition: A | Discharge status: Home / Self Care | Payer: Medicare Other | Source: Ambulatory Visit | Attending: Adult Health | Admitting: Adult Health

## 2015-07-23 ENCOUNTER — Encounter: Payer: Self-pay | Admitting: Adult Health

## 2015-07-23 VITALS — BP 140/78 | HR 88 | Ht 66.0 in | Wt 215.0 lb

## 2015-07-23 DIAGNOSIS — Z01419 Encounter for gynecological examination (general) (routine) without abnormal findings: Secondary | ICD-10-CM | POA: Insufficient documentation

## 2015-07-23 DIAGNOSIS — Z1151 Encounter for screening for human papillomavirus (HPV): Secondary | ICD-10-CM | POA: Diagnosis not present

## 2015-07-23 DIAGNOSIS — Z124 Encounter for screening for malignant neoplasm of cervix: Secondary | ICD-10-CM

## 2015-07-23 DIAGNOSIS — Z1212 Encounter for screening for malignant neoplasm of rectum: Secondary | ICD-10-CM | POA: Diagnosis not present

## 2015-07-23 LAB — HEMOCCULT GUIAC POC 1CARD (OFFICE): Fecal Occult Blood, POC: NEGATIVE

## 2015-07-23 NOTE — Progress Notes (Signed)
Patient ID: Renee Pratt, female   DOB: June 08, 1946, 70 y.o.   MRN: [id number] History of Present Illness: Renee Pratt is a 70 year old black female, new to this office in for a well woman gyn exam and pap.She has no complaints today, she is postmenopausal and has had abnormal pap in past.She has history of DVT and has IVC filter and is on coumadin. PCP is [facility].   Current Medications, Allergies, Past Medical History, Past Surgical History, Family History and Social History were reviewed in [employer] record.     Review of Systems: Patient denies any headaches, hearing loss, fatigue, blurred vision, shortness of breath, chest pain, abdominal pain, problems with bowel movements, urination, or intercourse(not having sex). No joint pain or mood swings.Has had some discomfort after eats on left and has appt with Dr Britta Mccreedy [facility] soon and needs colonoscopy, had polyps in past.   Physical Exam:BP 140/78 mmHg  Pulse 88  Ht 5\' 6"  (1.676 m)  Wt 215 lb (97.523 kg)  BMI 34.72 kg/m2 General:  Well developed, well nourished, no acute distress Skin:  Warm and dry Neck:  Midline trachea, normal thyroid, good ROM, no lymphadenopathy, no carotid bruits heard Lungs; Clear to auscultation bilaterally Breast:  No dominant palpable mass, retraction, or nipple discharge Cardiovascular: Regular rate and rhythm Abdomen:  Soft, non tender, no hepatosplenomegaly Pelvic:  External genitalia is normal in appearance, no lesions.  The vagina has decreased color, moisture and rugae. Urethra has no lesions or masses. The cervix is smooth, pap with HPV performed.  Uterus is felt to be normal size, shape, and contour.  No adnexal masses or tenderness noted.Bladder is non tender, no masses felt. Rectal: Good sphincter tone, no polyps, or hemorrhoids felt.  Hemoccult negative. Extremities/musculoskeletal:  No swelling or varicosities noted, no clubbing or cyanosis, has discoloration right lower  leg where has had DVT Psych:  No mood changes, alert and cooperative,seems happy She got flu shot.  Impression: Well woman gyn exam with pap    Plan: Labs with PCP Mammogram yearly Colonoscopy with GI Physical in 2 years, can stop paps after 65 if desires, if had 10 years of normal ones

## 2015-07-23 NOTE — Patient Instructions (Signed)
Mammogram yearly Physical in 2 years Labs with PCP

## 2015-07-28 LAB — CYTOLOGY - PAP

## 2015-07-29 ENCOUNTER — Encounter: Payer: Self-pay | Admitting: Pediatrics

## 2015-07-29 ENCOUNTER — Ambulatory Visit (INDEPENDENT_AMBULATORY_CARE_PROVIDER_SITE_OTHER): Payer: Medicare Other | Admitting: Pediatrics

## 2015-07-29 VITALS — BP 131/73 | HR 82 | Temp 97.7°F | Ht 66.0 in | Wt 212.2 lb

## 2015-07-29 DIAGNOSIS — Z79899 Other long term (current) drug therapy: Secondary | ICD-10-CM

## 2015-07-29 DIAGNOSIS — I10 Essential (primary) hypertension: Secondary | ICD-10-CM

## 2015-07-29 LAB — POCT INR: INR: 1.9

## 2015-07-29 NOTE — Progress Notes (Signed)
Subjective:    Patient ID: Renee Pratt, female    DOB: 29-Apr-1946, 70 y.o.   MRN: [id number]  CC: Blood Pressure Check   HPI: Renee Pratt is a 70 y.o. female presenting for Blood Pressure Check  Also due for INR check. No headaches or vision changes Overall doing well No SOB No bleeding Has been eating consistent amount of greens including spinach    Depression screen [facility] 2/9 07/29/2015 05/20/2015 04/15/2015 03/18/2015 08/17/2014  Decreased Interest 0 0 0 0 2  Down, Depressed, Hopeless 0 0 0 0 2  PHQ - 2 Score 0 0 0 0 4  Altered sleeping - - - - 0  Tired, decreased energy - - - - 1  Change in appetite - - - - -  Feeling bad or failure about yourself  - - - - 1  Trouble concentrating - - - - 1  Moving slowly or fidgety/restless - - - - 1  Suicidal thoughts - - - - 0  PHQ-9 Score - - - - 8     Relevant past medical, surgical, family and social history reviewed and updated as indicated. Interim medical history since our last visit reviewed. Allergies and medications reviewed and updated.    ROS: Per HPI unless specifically indicated above  History  Smoking status  . Former Smoker -- 0.25 packs/day for 20 years  . Types: Cigarettes  . Quit date: 10/17/1997  Smokeless tobacco  . Never Used    Comment: Quit x 25 years    Past Medical History Patient Active Problem List   Diagnosis Date Noted  . Encounter for long-term (current) use of high-risk medication 07/29/2015  . Heel pain, bilateral 05/20/2015  . Acquired bilateral flat feet 05/20/2015  . Trochanteric bursitis of both hips 05/20/2015  . Tenderness of right calf 05/20/2015  . Depression 08/17/2014  . DVT, lower extremity, recurrent, right ([place]) 07/21/2013  . Constipation - functional 05/13/2013  . GERD (gastroesophageal reflux disease)   . 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 Blood clotting tendency   . Unspecified vitamin D deficiency 12/24/2012  . Obesity 12/24/2012  . Essential hypertension  10/17/2012  . Other and unspecified hyperlipidemia 10/17/2012    Current Outpatient Prescriptions  Medication Sig Dispense Refill  . cholecalciferol (VITAMIN D) 1000 UNITS tablet Take 2,000 Units by mouth daily.    Marland Kitchen docusate sodium (COLACE) 100 MG capsule Take 100 mg by mouth daily as needed for mild constipation.    . hydrochlorothiazide (HYDRODIURIL) 25 MG tablet TAKE ONE TABLET BY MOUTH ONCE DAILY 30 tablet 5  . losartan (COZAAR) 100 MG tablet TAKE ONE TABLET BY MOUTH ONCE DAILY 90 tablet 1  . Multiple Vitamins-Minerals (CENTRUM SILVER PO) Take 1 tablet by mouth every other day.    Marland Kitchen NIFEdipine (PROCARDIA XL) 60 MG 24 hr tablet Take 1 tablet (60 mg total) by mouth daily. 90 tablet 0  . polyethylene glycol (MIRALAX / GLYCOLAX) packet Take 17 g by mouth daily.    Marland Kitchen warfarin (COUMADIN) 5 MG tablet Take 1 to 1.5 tablets by mouth once daily as directed by anticoagulation clinic. 100 tablet 0   No current facility-administered medications for this visit.       Objective:    BP 131/73 mmHg  Pulse 82  Temp(Src) 97.7 F (36.5 C) (Oral)  Ht 5\' 6"  (1.676 m)  Wt 212 lb 3.2 oz (96.253 kg)  BMI 34.27 kg/m2  Wt Readings from Last 3 Encounters:  07/29/15  212 lb 3.2 oz (96.253 kg)  07/23/15 215 lb (97.523 kg)  05/20/15 209 lb 3.2 oz (94.892 kg)    Gen: NAD, alert, cooperative with exam, NCAT EYES: EOMI, no scleral injection or icterus ENT:   OP without erythema LYMPH: no cervical LAD CV: NRRR, normal S1/S2, no murmur, distal pulses 2+ b/l Resp: CTABL, no wheezes, normal WOB Ext: No edema, warm Neuro: Alert and oriented MSK: normal muscle bulk     Assessment & Plan:    Renee Pratt was seen today for blood pressure check, INR check.  Diagnoses and all orders for this visit:  Encounter for long-term (current) use of high-risk medication INR 1.9. Takes dose in the morning. Was taking 7.5mg  M and F, 5mg  other days of the week. Rec she take 7.5mg  this sat as well, RTC for recheck next  week as she has scheduled.  -     POCT INR  Essential hypertension Much improved. Continue current medications.   Follow up plan: No Follow-up on file.  Renee Found, MD [facility] 07/29/2015, 2:23 PM

## 2015-08-11 DIAGNOSIS — Z86010 Personal history of colonic polyps: Secondary | ICD-10-CM | POA: Diagnosis not present

## 2015-09-09 ENCOUNTER — Ambulatory Visit: Payer: Self-pay | Admitting: Pharmacist

## 2015-09-17 ENCOUNTER — Ambulatory Visit: Payer: Self-pay | Admitting: Pharmacist

## 2015-09-18 ENCOUNTER — Other Ambulatory Visit: Payer: Self-pay | Admitting: Pharmacist

## 2015-09-21 ENCOUNTER — Ambulatory Visit (HOSPITAL_COMMUNITY): Payer: Medicare Other | Admitting: Psychology

## 2015-09-23 ENCOUNTER — Encounter: Payer: Self-pay | Admitting: Pharmacist

## 2015-09-23 ENCOUNTER — Ambulatory Visit (INDEPENDENT_AMBULATORY_CARE_PROVIDER_SITE_OTHER): Payer: Medicare Other | Admitting: Pharmacist

## 2015-09-23 VITALS — BP 136/80 | HR 75 | Ht 66.0 in | Wt 211.8 lb

## 2015-09-23 DIAGNOSIS — Z7901 Long term (current) use of anticoagulants: Secondary | ICD-10-CM

## 2015-09-23 DIAGNOSIS — I82401 Acute embolism and thrombosis of unspecified deep veins of right lower extremity: Secondary | ICD-10-CM

## 2015-09-23 DIAGNOSIS — Z0000 Encounter for general adult medical examination without abnormal findings: Secondary | ICD-10-CM | POA: Diagnosis not present

## 2015-09-23 LAB — COAGUCHEK XS/INR WAIVED
INR: 1.9 — ABNORMAL HIGH (ref 0.9–1.1)
PROTHROMBIN TIME: 22.9 s

## 2015-09-23 NOTE — Patient Instructions (Addendum)
Anticoagulation Dose Instructions as of 09/23/2015      Renee Pratt Tue Wed Thu Fri Sat   New Dose 5 mg 7.5 mg 5 mg 5 mg 5 mg 7.5 mg 5 mg    Description        Take an extra 1/2 tablet today, then continue current warfarin dose of 1 and 1/2 tablets (7.5mg ) on mondays and fridays and 1 tablet all other days.     INR was 1.9 today   Renee Pratt , Thank you for taking time to come for your Medicare Wellness Visit. I appreciate your ongoing commitment to your health goals. Please review the following plan we discussed and let me know if I can assist you in the future.   These are the goals we discussed: Continue with plan to restart Silver Sneakers exercise classes Get toilet lift for toilet seat to prevent falling  Increase non-starchy vegetables - carrots, green bean, squash, zucchini, tomatoes, onions, peppers, spinach and other green leafy vegetables, cabbage, lettuce, cucumbers, asparagus, okra (not fried), eggplant limit sugar and processed foods (cakes, cookies, ice cream, crackers and chips) Increase fresh fruit but limit serving sizes 1/2 cup or about the size of tennis or baseball limit red meat to no more than 1-2 times per week (serving size about the size of your palm) Choose whole grains / lean proteins - whole wheat bread, quinoa, whole grain rice (1/2 cup), fish, chicken, [place]   This is a list of the screening recommended for you and due dates:  Health Maintenance  Topic Date Due  .  Hepatitis C: One time screening is recommended by Center for Disease Control  (CDC) for  adults born from [birth year] through 1963.   03/10/1946 - will get with next labs  . Tetanus Vaccine  07/10/2012 - $45 today; will get at next visit  . Mammogram  02/03/2015 - has appt 10/08/15  . Flu Shot  02/08/2016  . DEXA scan (bone density measurement)  12/18/2017  . Colon Cancer Screening  12/07/2024  . Shingles Vaccine  Completed  . Pneumonia vaccines  Completed   Fall Prevention in the Home  Falls can cause  injuries and can affect people from all age groups. There are many simple things that you can do to make your home safe and to help prevent falls. WHAT CAN I DO ON THE OUTSIDE OF MY HOME?  Regularly repair the edges of walkways and driveways and fix any cracks.  Remove high doorway thresholds.  Trim any shrubbery on the main path into your home.  Use bright outdoor lighting.  Clear walkways of debris and clutter, including tools and rocks.  Regularly check that handrails are securely fastened and in good repair. Both sides of any steps should have handrails.  Install guardrails along the edges of any raised decks or porches.  Have leaves, snow, and ice cleared regularly.  Use sand or salt on walkways during winter months.  In the garage, clean up any spills right away, including grease or oil spills. WHAT CAN I DO IN THE BATHROOM?  Use night lights.  Install grab bars by the toilet and in the tub and shower. Do not use towel bars as grab bars.  Use non-skid mats or decals on the floor of the tub or shower.  If you need to sit down while you are in the shower, use a plastic, non-slip stool.Marland Kitchen  Keep the floor dry. Immediately clean up any water that spills on the floor.  Remove soap buildup in the tub or shower on a regular basis.  Attach bath mats securely with double-sided non-slip rug tape.  Remove throw rugs and other tripping hazards from the floor. WHAT CAN I DO IN THE BEDROOM?  Use night lights.  Make sure that a bedside light is easy to reach.  Do not use oversized bedding that drapes onto the floor.  Have a firm chair that has side arms to use for getting dressed.  Remove throw rugs and other tripping hazards from the floor. WHAT CAN I DO IN THE KITCHEN?   Clean up any spills right away.  Avoid walking on wet floors.  Place frequently used items in easy-to-reach places.  If you need to reach for something above you, use a sturdy step stool that has a  grab bar.  Keep electrical cables out of the way.  Do not use floor polish or wax that makes floors slippery. If you have to use wax, make sure that it is non-skid floor wax.  Remove throw rugs and other tripping hazards from the floor. WHAT CAN I DO IN THE STAIRWAYS?  Do not leave any items on the stairs.  Make sure that there are handrails on both sides of the stairs. Fix handrails that are broken or loose. Make sure that handrails are as long as the stairways.  Check any carpeting to make sure that it is firmly attached to the stairs. Fix any carpet that is loose or worn.  Avoid having throw rugs at the top or bottom of stairways, or secure the rugs with carpet tape to prevent them from moving.  Make sure that you have a light switch at the top of the stairs and the bottom of the stairs. If you do not have them, have them installed. WHAT ARE SOME OTHER FALL PREVENTION TIPS?  Wear closed-toe shoes that fit well and support your feet. Wear shoes that have rubber soles or low heels.  When you use a stepladder, make sure that it is completely opened and that the sides are firmly locked. Have someone hold the ladder while you are using it. Do not climb a closed stepladder.  Add color or contrast paint or tape to grab bars and handrails in your home. Place contrasting color strips on the first and last steps.  Use mobility aids as needed, such as canes, walkers, scooters, and crutches.  Turn on lights if it is dark. Replace any light bulbs that burn out.  Set up furniture so that there are clear paths. Keep the furniture in the same spot.  Fix any uneven floor surfaces.  Choose a carpet design that does not hide the edge of steps of a stairway.  Be aware of any and all pets.  Review your medicines with your healthcare provider. Some medicines can cause dizziness or changes in blood pressure, which increase your risk of falling. Talk with your health care provider about other ways  that you can decrease your risk of falls. This may include working with a physical therapist or trainer to improve your strength, balance, and endurance.   This information is not intended to replace advice given to you by your health care provider. Make sure you discuss any questions you have with your health care provider.   Document Released: 06/16/2002 Document Revised: 11/10/2014 Document Reviewed: 07/31/2014 Elsevier Interactive Patient Education [employer].

## 2015-09-23 NOTE — Progress Notes (Signed)
Patient ID: Renee Pratt, female   DOB: [birth year], 70 y.o.   MRN: [id number]    Subjective:   Renee Pratt is a 70 y.o. black female who presents for a subsequent Medicare Annual Wellness Visit and to recheck INR / protime.  Renee Pratt is pleasant and does not report any acute medical concerns.   She has moved to a [place] apartment complex in the last month and she reports that she is much happier in her [place] apartment and that she feels that then environment is healthier.  She does have to climb stairs to get to her [place] apartment but she is being cautious and is using handrails. She lives alone and has lived in [place], [place] for the last 3 years when she moved from [place] to be close to family.    Review of Systems  Review of Systems  Constitutional: Negative.   HENT: Negative.   Eyes: Positive for blurred vision.  Respiratory: Negative.   Cardiovascular: Negative.   Gastrointestinal: Negative.   Genitourinary: Negative.   Musculoskeletal: Positive for joint pain.  Skin: Negative.   Neurological: Negative.   Endo/Heme/Allergies: Bruises/bleeds easily (taking warfarin).  Psychiatric/Behavioral: Negative.      Current Medications (verified) Outpatient Encounter Prescriptions as of 09/23/2015  Medication Sig  . cholecalciferol (VITAMIN D) 1000 UNITS tablet Take 2,000 Units by mouth daily.  Marland Kitchen docusate sodium (COLACE) 100 MG capsule Take 100 mg by mouth daily as needed for mild constipation.  . hydrochlorothiazide (HYDRODIURIL) 25 MG tablet TAKE ONE TABLET BY MOUTH ONCE DAILY  . losartan (COZAAR) 100 MG tablet TAKE ONE TABLET BY MOUTH ONCE DAILY  . Multiple Vitamins-Minerals (CENTRUM SILVER PO) Take 1 tablet by mouth every other day.  Marland Kitchen NIFEdipine (PROCARDIA XL) 60 MG 24 hr tablet Take 1 tablet (60 mg total) by mouth daily.  . polyethylene glycol (MIRALAX / GLYCOLAX) packet Take 17 g by mouth daily.  Marland Kitchen warfarin (COUMADIN) 5 MG tablet TAKE ONE TO ONE AND ONE-HALF TABLET BY  MOUTH ONCE DAILY AS DIRECTED   No facility-administered encounter medications on file as of 09/23/2015.    Allergies (verified) Tetracyclines & related   History: Past Medical History  Diagnosis Date  . Uterine fibroid   . Blood clotting tendency   . DVT, lower extremity, recurrent (HCC)     Right sided. First time 2011, another in 2012, 03/2013  . 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 Hyperlipidemia   . Hypertension   . GERD (gastroesophageal reflux disease)     hiatal hernia  . Weight gain 05/13/2013  . Constipation 05/13/2013  . Hiatal hernia   . DDD (degenerative disc disease), lumbar   . Uterine fibroid   . Cataract   . Bilateral dry eyes   . Vaginal Pap smear, abnormal   . 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 Colposcopy    . Insertion of vena cava filter  01-17-2011    dr pindr # [phone]  [place]  . Cervical biopsy    . Colposcopy     Family History  Problem Relation Age of Onset  . Kidney disease Mother   . Hypertension Mother   . Stroke Mother 34  . AAA (abdominal aortic aneurysm) Mother   . Cancer Father 74    lung cancer  . Clotting disorder Neg Hx   . Colon polyps Neg Hx   .  Other Cousin     died fo pulmonary embolus  . Colon cancer Cousin   . Depression Maternal Grandmother    Social History   Occupational History  . Not on file.   Social History Main Topics  . Smoking status: Former Smoker -- 0.25 packs/day for 20 years    Types: Cigarettes    Quit date: 10/17/1997  . Smokeless tobacco: Never Used     Comment: Quit x 25 years  . Alcohol Use: Yes     Comment: Socially  . Drug Use: No  . Sexual Activity: No    Do you feel safe at home?  Yes  Dietary issues and exercise activities: Current Exercise Habits: The patient does not participate in regular exercise at present  Current Dietary habits:  "I eat whatever I want"   Objective:    Today's Vitals     09/23/15 1428  BP: 136/80  Pulse: 75  Height: 5' 6"  (1.676 m)  Weight: 211 lb 12 oz (96.049 kg)  PainSc: 2   PainLoc: Knee   Body mass index is 34.19 kg/(m^2).   INR = 1.9 (patient reports missing dose 5 days ago because ran out of medication over the weekend)  Activities of Daily Living In your present state of health, do you have any difficulty performing the following activities: 09/23/2015  Hearing? N  Vision? Y  Difficulty concentrating or making decisions? N  Walking or climbing stairs? Y  Dressing or bathing? N  Doing errands, shopping? N  Preparing Food and eating ? N  Using the Toilet? N  In the past six months, have you accidently leaked urine? N  Do you have problems with loss of bowel control? N  Managing your Medications? N  Managing your Finances? N  Housekeeping or managing your Housekeeping? N    Are there smokers in your home (other than you)? No   Cardiac Risk Factors include: advanced age (>21mn, >>66women);dyslipidemia;family history of premature cardiovascular disease;hypertension;sedentary lifestyle  Depression Screen PHQ 2/9 Scores 09/23/2015 07/29/2015 05/20/2015 04/15/2015  PHQ - 2 Score 0 0 0 0  PHQ- 9 Score - - - -    Fall Risk Fall Risk  09/23/2015 07/29/2015 05/20/2015 04/15/2015 03/18/2015  Falls in the past year? Yes No No No No  Number falls in past yr: 1 - - - -  Injury with Fall? No - - - -  Follow up Falls prevention discussed - - - -    Cognitive Function: MMSE - Mini Mental State Exam 09/23/2015  Orientation to time 5  Orientation to Place 5  Registration 3  Attention/ Calculation 3  Recall 3  Language- name 2 objects 2  Language- repeat 1  Language- follow 3 step command 3  Language- read & follow direction 1  Write a sentence 1  Copy design 0  Total score 27    Immunizations and Health Maintenance Immunization History  Administered Date(s) Administered  . Influenza,inj,Quad PF,36+ Mos 05/08/2013, 05/20/2015  .  Pneumococcal Conjugate-13 12/08/2014  . Pneumococcal Polysaccharide-23 09/11/2012  . Tdap 07/10/2002  . Zoster 07/11/2007   Health Maintenance Due  Topic Date Due  . Hepatitis C Screening  [birth year] . TETANUS/TDAP  07/10/2012  . MAMMOGRAM  02/03/2015    Patient Care Team: CEustaquio Maize MD as PCP - General (Pediatrics) CCathe Mons MD (Gastroenterology) JEstill Dooms NP as Nurse Practitioner (Obstetrics and Gynecology) CWilliams Che MD as Consulting Physician (Ophthalmology)  Indicate any recent Medical Services  you may have received from other than [facility] providers in the past year (date may be approximate).    Assessment:    Annual Wellness Visit  Slightly subtherapeutic anticoagulation   Screening Tests Health Maintenance  Topic Date Due  . Hepatitis C Screening  02-05-1946  . TETANUS/TDAP  07/10/2012  . MAMMOGRAM  02/03/2015  . INFLUENZA VACCINE  02/08/2016  . DEXA SCAN  12/18/2017  . COLONOSCOPY  12/07/2024  . ZOSTAVAX  Completed  . PNA vac Low Risk Adult  Completed        Plan:   During the course of the visit Al[place]ria was educated and counseled about the following appropriate screening and preventive services:   Vaccines to include Pneumoccal, Influenza, Hepatitis B, Td, Zostavax - patient is due Tdap - checked price which would be $45 for patient.  She is not able to get today due to cost but would like to get at next appt  Colorectal cancer screening - has met with Dr Britta Mccreedy and they are setting up colonoscopy.  Patient will need to stop warfarin prior and consider bridge with enoxaparin.  BP is at goal   Lipids -due to have rechecked but not fasting today; patient reports compliance with statin / atorvasatin  Diabetes screening - last FBG was WNL  Bone Denisty / Osteoporosis Screening - UTD (last DEXA was normal)  Mammogram - has appt 10/08/2015  PAP - UTD  Glaucoma screening / Eye Exam - UTD  Nutrition counseling -  discussed limiting candy, chips and sweets;  Increase vegetables and fruits.  LImiting red meat and increase lean meats / proteins.  Physical activity - restart Silver Sneaker classes  Advanced Directives - information provided and discussed with patient.  Recommended patient is toilet seat lift to help with low toilet in her [place] apartment.   Fall prevention also discussed - list provided in AVS   Patient Instructions (the written plan) were given to the patient.   Cherre Robins, [facility]   09/23/2015

## 2015-10-08 ENCOUNTER — Encounter: Payer: Medicare Other | Admitting: *Deleted

## 2015-10-08 DIAGNOSIS — Z1231 Encounter for screening mammogram for malignant neoplasm of breast: Secondary | ICD-10-CM | POA: Diagnosis not present

## 2015-10-08 LAB — HM MAMMOGRAPHY

## 2015-10-10 ENCOUNTER — Other Ambulatory Visit: Payer: Self-pay | Admitting: Pharmacist

## 2015-10-10 ENCOUNTER — Other Ambulatory Visit: Payer: Self-pay | Admitting: Pediatrics

## 2015-10-20 ENCOUNTER — Encounter: Payer: Self-pay | Admitting: *Deleted

## 2015-10-21 ENCOUNTER — Ambulatory Visit (HOSPITAL_COMMUNITY): Payer: Medicare Other | Admitting: Psychology

## 2015-10-28 ENCOUNTER — Encounter: Payer: Self-pay | Admitting: Family Medicine

## 2015-10-28 ENCOUNTER — Ambulatory Visit (INDEPENDENT_AMBULATORY_CARE_PROVIDER_SITE_OTHER): Payer: Medicare Other | Admitting: Family Medicine

## 2015-10-28 ENCOUNTER — Encounter (INDEPENDENT_AMBULATORY_CARE_PROVIDER_SITE_OTHER): Payer: Self-pay

## 2015-10-28 VITALS — BP 138/80 | HR 87 | Temp 97.0°F | Ht 66.0 in | Wt 209.8 lb

## 2015-10-28 DIAGNOSIS — Z7901 Long term (current) use of anticoagulants: Secondary | ICD-10-CM

## 2015-10-28 DIAGNOSIS — E785 Hyperlipidemia, unspecified: Secondary | ICD-10-CM

## 2015-10-28 DIAGNOSIS — I10 Essential (primary) hypertension: Secondary | ICD-10-CM

## 2015-10-28 DIAGNOSIS — Z79899 Other long term (current) drug therapy: Secondary | ICD-10-CM | POA: Diagnosis not present

## 2015-10-28 DIAGNOSIS — I82401 Acute embolism and thrombosis of unspecified deep veins of right lower extremity: Secondary | ICD-10-CM

## 2015-10-28 LAB — COAGUCHEK XS/INR WAIVED
INR: 3.2 — AB (ref 0.9–1.1)
PROTHROMBIN TIME: 38.7 s

## 2015-10-28 MED ORDER — ATORVASTATIN CALCIUM 40 MG PO TABS: 40.0000 mg | ORAL_TABLET | Freq: Every day | ORAL | Status: DC

## 2015-10-28 NOTE — Progress Notes (Signed)
   HPI  Patient presents today here for follow-up and INR check.  Coumadin - recurrent DVT, PE No recent changes in diet, she's afraid that she may have backed off a little bit more than usual on the greens. Good medication compliance No bleeding.  Hypertension Averages 130s over 80s at home Good medication compliance No chest pain, dyspnea, palpitations Leg edema is stable  Hyperlipidemia Needs medication refill of Lipitor States that she for a little while was taking garlic instead of Lipitor, she's been out of it for about 2 weeks  PMH: Smoking status noted ROS: Per HPI  Objective: BP 138/80 mmHg  Pulse 87  Temp(Src) 97 F (36.1 C) (Oral)  Ht 5\' 6"  (1.676 m)  Wt 209 lb 12.8 oz (95.165 kg)  BMI 33.88 kg/m2 Gen: NAD, alert, cooperative with exam HEENT: NCAT CV: RRR, good S1/S2, no murmur Resp: CTABL, no wheezes, non-labored Ext: No edema, warm Neuro: Alert and oriented, No gross deficits  Assessment and plan:  # Recurrent DVT, chronic anticoagulation with Coumadin INR is slightly supratherapeutic today Reduced dose slightly, 7.5 mg on Monday, 5 mg daily (total dose reduction by 2.5 mg per week) No bleeding, discussed diet  # Hypertension Well-controlled No changes, continue losartan, Procardia, HCTZ Labs in the summer  # Hyperlipidemia Refilled Lipitor Lipid panel next visit, approximately 3 months Deferred checking today as she has been noncompliant lately being out of pills for 2 weeks and previous to that taking garlic on some days instead   Meds ordered this encounter  Medications  . DISCONTD: atorvastatin (LIPITOR) 40 MG tablet    Sig: Take 40 mg by mouth daily.  Marland Kitchen atorvastatin (LIPITOR) 40 MG tablet    Sig: Take 1 tablet (40 mg total) by mouth daily.    Dispense:  90 tablet    Refill:  [place], MD [facility] 10/28/2015, 2:47 PM

## 2015-10-28 NOTE — Patient Instructions (Signed)
Great to meet you!  Change you coumadin dose: 7.5 mg ( 1 and a half pills) on Monday only,  5 mg (1 pill) on other days.   Come back in 1 month for an INR check with our clinical pharmacist  Come back in July for lab work, come fasting

## 2015-11-08 DIAGNOSIS — Z86010 Personal history of colonic polyps: Secondary | ICD-10-CM | POA: Diagnosis not present

## 2015-11-09 ENCOUNTER — Telehealth: Payer: Self-pay | Admitting: Pharmacist

## 2015-11-09 NOTE — Telephone Encounter (Signed)
Please have patient make an appt for protime - will need to be checked prior to colonoscopy since last was 10/28/15 and was elevated at 3.2.  Will discussed at appt about holding warfarin and if bridging with Lovenox is needed.  Appt needed this week.

## 2015-11-09 NOTE — Telephone Encounter (Signed)
Patient aware and appointment scheduled for Wed May 4th @ 11:30 with Tammy.

## 2015-11-10 ENCOUNTER — Ambulatory Visit (INDEPENDENT_AMBULATORY_CARE_PROVIDER_SITE_OTHER): Payer: Medicare Other | Admitting: Pharmacist

## 2015-11-10 DIAGNOSIS — Z7901 Long term (current) use of anticoagulants: Secondary | ICD-10-CM

## 2015-11-10 DIAGNOSIS — I82401 Acute embolism and thrombosis of unspecified deep veins of right lower extremity: Secondary | ICD-10-CM

## 2015-11-10 LAB — COAGUCHEK XS/INR WAIVED
INR: 2.5 — AB (ref 0.9–1.1)
Prothrombin Time: 29.9 s

## 2015-11-10 MED ORDER — ENOXAPARIN SODIUM 150 MG/ML ~~LOC~~ SOLN: 150.0000 mg | SUBCUTANEOUS | Status: DC

## 2015-11-10 NOTE — Patient Instructions (Signed)
Anticoagulation Dose Instructions as of 11/10/2015      [place] Tue Wed Thu Fri Sat   New Dose 5 mg 7.5 mg 5 mg 5 mg 5 mg 7.5 mg 5 mg    Description        Continue current warfarin dose of 1 and 1/2 tablets (7.5mg ) on mondays and fridays and 1 tablet all other days.     INR was 2.5 today

## 2015-11-10 NOTE — Progress Notes (Signed)
Patient ID: [place], female   DOB: 11/05/1945, 70 y.o.   MRN: [id number] Patient to have colonoscopy 11/19/15.  GI would like plan for bridging with lovenox while she is off warfarin.  Hold warfarin starting 11/14/15.  Start Lovenox 150mg  qd 11/15/15 through 11/18/15.  Restart both warfarin 7.5mg  and lovenox 150mg  qd 24 hour after colonoscopy if no polyps removed.  Restart 48 after if polyps removed or as advised per GI.  RTC 11/22/15 for protime.

## 2015-11-11 ENCOUNTER — Ambulatory Visit: Payer: Medicare Other | Admitting: Pharmacist

## 2015-11-11 ENCOUNTER — Ambulatory Visit (HOSPITAL_COMMUNITY): Payer: Medicare Other | Admitting: Psychology

## 2015-11-11 ENCOUNTER — Telehealth: Payer: Self-pay | Admitting: Pediatrics

## 2015-11-11 NOTE — Telephone Encounter (Signed)
No alternative to generic enoxaparin.  (her copay was $95).  I do not think there a lower dose would be any cheaper. Will verify with [facility] to make sure that they are filling generic. They are filling generic - patient will pick up tomorrow.

## 2015-11-19 DIAGNOSIS — Z87891 Personal history of nicotine dependence: Secondary | ICD-10-CM | POA: Diagnosis not present

## 2015-11-19 DIAGNOSIS — Z1211 Encounter for screening for malignant neoplasm of colon: Secondary | ICD-10-CM | POA: Diagnosis not present

## 2015-11-19 DIAGNOSIS — Z801 Family history of malignant neoplasm of trachea, bronchus and lung: Secondary | ICD-10-CM | POA: Diagnosis not present

## 2015-11-19 DIAGNOSIS — Z883 Allergy status to other anti-infective agents status: Secondary | ICD-10-CM | POA: Diagnosis not present

## 2015-11-19 DIAGNOSIS — Z8249 Family history of ischemic heart disease and other diseases of the circulatory system: Secondary | ICD-10-CM | POA: Diagnosis not present

## 2015-11-19 DIAGNOSIS — K635 Polyp of colon: Secondary | ICD-10-CM | POA: Diagnosis not present

## 2015-11-19 DIAGNOSIS — K219 Gastro-esophageal reflux disease without esophagitis: Secondary | ICD-10-CM | POA: Diagnosis not present

## 2015-11-19 DIAGNOSIS — I10 Essential (primary) hypertension: Secondary | ICD-10-CM | POA: Diagnosis not present

## 2015-11-19 DIAGNOSIS — Z841 Family history of disorders of kidney and ureter: Secondary | ICD-10-CM | POA: Diagnosis not present

## 2015-11-19 DIAGNOSIS — Z79899 Other long term (current) drug therapy: Secondary | ICD-10-CM | POA: Diagnosis not present

## 2015-11-19 DIAGNOSIS — D124 Benign neoplasm of descending colon: Secondary | ICD-10-CM | POA: Diagnosis not present

## 2015-11-19 DIAGNOSIS — E669 Obesity, unspecified: Secondary | ICD-10-CM | POA: Diagnosis not present

## 2015-11-19 DIAGNOSIS — Z86010 Personal history of colonic polyps: Secondary | ICD-10-CM | POA: Diagnosis not present

## 2015-11-19 DIAGNOSIS — E559 Vitamin D deficiency, unspecified: Secondary | ICD-10-CM | POA: Diagnosis not present

## 2015-11-19 DIAGNOSIS — K5730 Diverticulosis of large intestine without perforation or abscess without bleeding: Secondary | ICD-10-CM | POA: Diagnosis not present

## 2015-11-19 DIAGNOSIS — E785 Hyperlipidemia, unspecified: Secondary | ICD-10-CM | POA: Diagnosis not present

## 2015-11-19 DIAGNOSIS — D123 Benign neoplasm of transverse colon: Secondary | ICD-10-CM | POA: Diagnosis not present

## 2015-11-19 DIAGNOSIS — Z86718 Personal history of other venous thrombosis and embolism: Secondary | ICD-10-CM | POA: Diagnosis not present

## 2015-11-19 DIAGNOSIS — M719 Bursopathy, unspecified: Secondary | ICD-10-CM | POA: Diagnosis not present

## 2015-11-19 DIAGNOSIS — Z7901 Long term (current) use of anticoagulants: Secondary | ICD-10-CM | POA: Diagnosis not present

## 2015-11-19 DIAGNOSIS — Z6833 Body mass index (BMI) 33.0-33.9, adult: Secondary | ICD-10-CM | POA: Diagnosis not present

## 2015-11-19 DIAGNOSIS — G4730 Sleep apnea, unspecified: Secondary | ICD-10-CM | POA: Diagnosis not present

## 2015-11-19 LAB — HM COLONOSCOPY

## 2015-11-22 ENCOUNTER — Ambulatory Visit (INDEPENDENT_AMBULATORY_CARE_PROVIDER_SITE_OTHER): Payer: Medicare Other | Admitting: Pharmacist

## 2015-11-22 DIAGNOSIS — Z7901 Long term (current) use of anticoagulants: Secondary | ICD-10-CM | POA: Diagnosis not present

## 2015-11-22 DIAGNOSIS — I82401 Acute embolism and thrombosis of unspecified deep veins of right lower extremity: Secondary | ICD-10-CM | POA: Diagnosis not present

## 2015-11-22 LAB — COAGUCHEK XS/INR WAIVED
INR: 1.1 (ref 0.9–1.1)
PROTHROMBIN TIME: 13.1 s

## 2015-11-22 MED ORDER — POLYETHYLENE GLYCOL 3350 17 GM/SCOOP PO POWD
17.0000 g | Freq: Every day | ORAL | Status: DC
Start: 2015-11-22 — End: 2017-07-02

## 2015-11-22 NOTE — Patient Instructions (Signed)
Anticoagulation Dose Instructions as of 11/22/2015      [place] Tue Wed Thu Fri Sat   New Dose 5 mg 7.5 mg 5 mg 5 mg 5 mg 7.5 mg 5 mg    Description        Take 1 and 1/2 tablets on Tuesday (11/23/15) and Wednesday (11/24/2015).  Then usual warfarin dose of 1 and 1/2 tablets (7.5mg ) on mondays and fridays and 1 tablet all other days.     INR was 1.1 today

## 2015-11-30 ENCOUNTER — Encounter: Payer: Self-pay | Admitting: *Deleted

## 2015-12-01 ENCOUNTER — Encounter: Payer: Self-pay | Admitting: Pharmacist

## 2015-12-02 ENCOUNTER — Encounter: Payer: Self-pay | Admitting: Pediatrics

## 2015-12-07 ENCOUNTER — Ambulatory Visit (INDEPENDENT_AMBULATORY_CARE_PROVIDER_SITE_OTHER): Payer: Medicare Other | Admitting: Pharmacist Clinician (PhC)/ Clinical Pharmacy Specialist

## 2015-12-07 ENCOUNTER — Telehealth: Payer: Self-pay | Admitting: Pharmacist Clinician (PhC)/ Clinical Pharmacy Specialist

## 2015-12-07 DIAGNOSIS — I82401 Acute embolism and thrombosis of unspecified deep veins of right lower extremity: Secondary | ICD-10-CM

## 2015-12-07 DIAGNOSIS — Z7901 Long term (current) use of anticoagulants: Secondary | ICD-10-CM | POA: Diagnosis not present

## 2015-12-07 LAB — COAGUCHEK XS/INR WAIVED
INR: 3.1 — AB (ref 0.9–1.1)
PROTHROMBIN TIME: 36.8 s

## 2015-12-07 NOTE — Patient Instructions (Signed)
Anticoagulation Dose Instructions as of 12/07/2015      [place] Tue Wed Thu Fri Sat   New Dose 5 mg 7.5 mg 5 mg 5 mg 5 mg 7.5 mg 5 mg    Description        Continue same schedule as above  INR today is 3.1  Have 2-3 times a week a high vitamin K food

## 2015-12-08 NOTE — Telephone Encounter (Signed)
Called patient to see if she wanted to come in earlier for her appointment, however she could not and will stay with her 1pm appointment.

## 2015-12-09 ENCOUNTER — Encounter: Payer: Self-pay | Admitting: Pharmacist

## 2015-12-13 ENCOUNTER — Other Ambulatory Visit: Payer: Self-pay | Admitting: Pediatrics

## 2015-12-20 ENCOUNTER — Encounter: Payer: Self-pay | Admitting: Surgery

## 2015-12-22 ENCOUNTER — Other Ambulatory Visit: Payer: Self-pay | Admitting: Pediatrics

## 2015-12-27 ENCOUNTER — Ambulatory Visit: Payer: Medicare Other | Admitting: Surgery

## 2015-12-31 ENCOUNTER — Encounter: Payer: Self-pay | Admitting: Surgery

## 2016-01-04 ENCOUNTER — Encounter: Payer: Self-pay | Admitting: Pharmacist

## 2016-01-07 ENCOUNTER — Ambulatory Visit (INDEPENDENT_AMBULATORY_CARE_PROVIDER_SITE_OTHER): Payer: Medicare Other | Admitting: Surgery

## 2016-01-07 ENCOUNTER — Encounter: Payer: Self-pay | Admitting: Surgery

## 2016-01-07 ENCOUNTER — Other Ambulatory Visit: Payer: Self-pay | Admitting: Pediatrics

## 2016-01-07 VITALS — BP 142/80 | HR 83 | Temp 98.3°F | Resp 16 | Ht 66.0 in | Wt 204.0 lb

## 2016-01-07 DIAGNOSIS — I872 Venous insufficiency (chronic) (peripheral): Secondary | ICD-10-CM | POA: Diagnosis not present

## 2016-01-07 NOTE — Progress Notes (Signed)
Filed Vitals:   01/07/16 1320 01/07/16 1326  BP: 154/79 142/80  Pulse: 82 83  Temp: 98.3 F (36.8 C)   Resp: 16   Height: 5\' 6"  (1.676 m)   Weight: 204 lb (92.534 kg)   SpO2: 98%

## 2016-01-07 NOTE — Progress Notes (Signed)
[facility] Specialist of [facility]  Patient name: Renee Pr[place] MRN: [id number] DOB: 1945-10-16 Sex: female  REASON FOR VISIT: follow up  HPI: R[place]e [place] is a 70 y.o. female who I initially met in 2014 when she was trying to establish care with a vascular specialist.  At that time, she had a history of 2 episodes of DVT in the right leg.  She had an IVC filter placed proximally and 2010 before she moved here.  She had been on and off anticoagulation.  When I saw her she had pain and tenderness as well as swelling in her right ankle and calf.  Ultrasound showed acute DVT and therefore I started her on Eliquis.  She was sent for a hyper quite full workup through hematology.  By her report this was negative.  Her insurance, he stopped paying for the Eliquis, and therefore she was switched to Coumadin.  She was recently off of her Coumadin for a colonoscopy.  She is now back on therapeutic doses.  She does report some pain in her right calf.  She also reports swelling and discoloration on the inside of her right ankle.  She cannot wear the compression stockings because they bother her on the outside of her right calf.  She has been wearing support pantyhose which have helped.  Past Medical History  Diagnosis Date  . Uterine fibroid   . Blood clotting tendency   . DVT, lower extremity, recurrent (HCC)     Right sided. First time 2011, another in 2012, 03/2013  . 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 Hyperlipidemia   . Hypertension   . GERD (gastroesophageal reflux disease)     hiatal hernia  . Weight gain 05/13/2013  . Constipation 05/13/2013  . Hiatal hernia   . DDD (degenerative disc disease), lumbar   . Uterine fibroid   . Cataract   . Bilateral dry eyes   . Vaginal Pap smear, abnormal   . 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 DVT (deep venous thrombosis) (HCC)     Family History  Problem Relation Age of Onset    . Kidney disease Mother   . Hypertension Mother   . Stroke Mother 17  . AAA (abdominal aortic aneurysm) Mother   . Cancer Father 69    lung cancer  . Clotting disorder Neg Hx   . Colon polyps Neg Hx   . Other Cousin     died fo pulmonary embolus  . Colon cancer Cousin   . Depression Maternal Grandmother     SOCIAL HISTORY: Social History  Substance Use Topics  . Smoking status: Former Smoker -- 0.25 packs/day for 20 years    Types: Cigarettes    Quit date: 10/17/1997  . Smokeless tobacco: Never Used     Comment: Quit x 25 years  . Alcohol Use: 0.0 oz/week    0 Standard drinks or equivalent per week     Comment: Socially    Allergies  Allergen Reactions  . Tetracyclines & Related     Nausea, pain    Current Outpatient Prescriptions  Medication Sig Dispense Refill  . atorvastatin (LIPITOR) 40 MG tablet Take 1 tablet (40 mg total) by mouth daily. 90 tablet 1  . cholecalciferol (VITAMIN D) 1000 UNITS tablet Take 2,000 Units by mouth daily.    Marland Kitchen docusate sodium (COLACE) 100 MG capsule Take 100 mg by mouth daily as needed for mild constipation.    Marland Kitchen  hydrochlorothiazide (HYDRODIURIL) 25 MG tablet TAKE ONE TABLET BY MOUTH ONCE DAILY 30 tablet 4  . losartan (COZAAR) 100 MG tablet TAKE ONE TABLET BY MOUTH ONCE DAILY 90 tablet 0  . Multiple Vitamins-Minerals (CENTRUM SILVER PO) Take 1 tablet by mouth every other day.    Marland Kitchen NIFEdipine (PROCARDIA XL/ADALAT-CC) 60 MG 24 hr tablet TAKE ONE TABLET BY MOUTH ONCE DAILY 90 tablet 0  . polyethylene glycol powder (GLYCOLAX/MIRALAX) powder Take 17 g by mouth daily. 528 g 1  . warfarin (COUMADIN) 5 MG tablet TAKE ONE TO ONE & ONE-HALF TABLETS BY MOUTH ONCE DAILY AS  DIRECTED 100 tablet 0   No current facility-administered medications for this visit.    REVIEW OF SYSTEMS:  [X]  denotes positive finding, [ ]  denotes negative finding Cardiac  Comments:  Chest pain or chest pressure:    Shortness of breath upon exertion:    Short of breath  when lying flat:    Irregular heart rhythm:        Vascular    Pain in calf, thigh, or hip brought on by ambulation:    Pain in feet at night that wakes you up from your sleep:     Blood clot in your veins: x   Leg swelling:  x2       Pulmonary    Oxygen at home:    Productive cough:     Wheezing:         Neurologic    Sudden weakness in arms or legs:     Sudden numbness in arms or legs:     Sudden onset of difficulty speaking or slurred speech:    Temporary loss of vision in one eye:     Problems with dizziness:         Gastrointestinal    Blood in stool:     Vomited blood:         Genitourinary    Burning when urinating:     Blood in urine:        Psychiatric    Major depression:         Hematologic    Bleeding problems:    Problems with blood clotting too easily:        Skin    Rashes or ulcers:        Constitutional    Fever or chills:      PHYSICAL EXAM: Filed Vitals:   01/07/16 1320 01/07/16 1326  BP: 154/79 142/80  Pulse: 82 83  Temp: 98.3 F (36.8 C)   Resp: 16   Height: 5' 6"  (1.676 m)   Weight: 204 lb (92.534 kg)   SpO2: 98%     GENERAL: The patient is a well-nourished female, in no acute distress. The vital signs are documented above. CARDIAC: There is a regular rate and rhythm.  VASCULAR: 1+ edema to the right leg.  Hyperpigmentation around the medial ankle PULMONARY: There is good air exchange bilaterally without wheezing or rales. MUSCULOSKELETAL: There are no major deformities or cyanosis. NEUROLOGIC: No focal weakness or paresthesias are detected. SKIN: There are no ulcers or rashes noted. PSYCHIATRIC: The patient has a normal affect.  DATA:  No studies today  MEDICAL ISSUES: History of DVT: I discussed the importance of wearing the compression stockings to minimize edema in the hyperpigmentation.  He has done well wearing girdle-type support stockings.  I am going to try to get her to wear 15-20 or 20-30 girdle type compression  hose.  She is  willing to try this.  Despite not finding a etiology for her DVT, I would recommend lifelong anticoagulation as she has had multiple DVTs when she has not been anticoagulated.  She will follow-up with me on an as-needed basis.    Annamarie Major, MD [facility] [phone] Pager [phone]

## 2016-01-24 ENCOUNTER — Ambulatory Visit (INDEPENDENT_AMBULATORY_CARE_PROVIDER_SITE_OTHER): Payer: Medicare Other | Admitting: Pharmacist

## 2016-01-24 DIAGNOSIS — Z7901 Long term (current) use of anticoagulants: Secondary | ICD-10-CM

## 2016-01-24 DIAGNOSIS — I82401 Acute embolism and thrombosis of unspecified deep veins of right lower extremity: Secondary | ICD-10-CM | POA: Diagnosis not present

## 2016-01-24 LAB — COAGUCHEK XS/INR WAIVED
INR: 2.1 — AB (ref 0.9–1.1)
PROTHROMBIN TIME: 24.6 s

## 2016-01-24 NOTE — Patient Instructions (Signed)
Anticoagulation Dose Instructions as of 01/24/2016      [place] Tue Wed Thu Fri Sat   New Dose 5 mg 7.5 mg 5 mg 5 mg 5 mg 7.5 mg 5 mg    Description        Continue same schedule as above.  Patient will have a serving of a high vitamin K food today.  INR today is 3.1  Have 2-3 times a week a high vitamin K food     INR was 2.1 today

## 2016-02-10 DIAGNOSIS — H2513 Age-related nuclear cataract, bilateral: Secondary | ICD-10-CM | POA: Diagnosis not present

## 2016-02-10 DIAGNOSIS — H5203 Hypermetropia, bilateral: Secondary | ICD-10-CM | POA: Diagnosis not present

## 2016-02-10 DIAGNOSIS — H52223 Regular astigmatism, bilateral: Secondary | ICD-10-CM | POA: Diagnosis not present

## 2016-02-10 DIAGNOSIS — H00029 Hordeolum internum unspecified eye, unspecified eyelid: Secondary | ICD-10-CM | POA: Diagnosis not present

## 2016-02-17 ENCOUNTER — Encounter: Payer: Self-pay | Admitting: *Deleted

## 2016-02-18 ENCOUNTER — Ambulatory Visit: Payer: Self-pay | Admitting: Cardiology

## 2016-02-24 ENCOUNTER — Encounter: Payer: Self-pay | Admitting: Pediatrics

## 2016-02-24 ENCOUNTER — Ambulatory Visit (INDEPENDENT_AMBULATORY_CARE_PROVIDER_SITE_OTHER): Payer: Medicare Other | Admitting: Pediatrics

## 2016-02-24 VITALS — BP 126/73 | HR 84 | Temp 97.7°F | Ht 66.0 in | Wt 207.2 lb

## 2016-02-24 DIAGNOSIS — I10 Essential (primary) hypertension: Secondary | ICD-10-CM

## 2016-02-24 DIAGNOSIS — Z1159 Encounter for screening for other viral diseases: Secondary | ICD-10-CM

## 2016-02-24 DIAGNOSIS — E669 Obesity, unspecified: Secondary | ICD-10-CM | POA: Diagnosis not present

## 2016-02-24 DIAGNOSIS — Z79899 Other long term (current) drug therapy: Secondary | ICD-10-CM | POA: Diagnosis not present

## 2016-02-24 DIAGNOSIS — I82401 Acute embolism and thrombosis of unspecified deep veins of right lower extremity: Secondary | ICD-10-CM | POA: Diagnosis not present

## 2016-02-24 LAB — COAGUCHEK XS/INR WAIVED
INR: 1.8 — ABNORMAL HIGH (ref 0.9–1.1)
Prothrombin Time: 21 s

## 2016-02-24 NOTE — Progress Notes (Signed)
    Subjective:    Patient ID: [place]e Pr[place], female    DOB: 10/19/1945, 70 y.o.   MRN: [id number]  CC: Follow-up   HPI: Renee Pratt is a 70 y.o. female presenting for Follow-up  Recurrent DVTs: on coumadin Kale last night Has vegetables twice a week  HTN:  No headaches No CP  BMI elevtaed: Silver sneakers usually two days a week  Relevant past medical, surgical, family and social history reviewed. Interim medical history since our last visit reviewed. Allergies and medications reviewed and updated.  History  Smoking Status  . Former Smoker  . Packs/day: 0.25  . Years: 20.00  . Types: Cigarettes  . Quit date: 10/17/1997  Smokeless Tobacco  . Never Used    Comment: Quit x 25 years    ROS: Per HPI      Objective:    BP 126/73   Pulse 84   Temp 97.7 F (36.5 C) (Oral)   Ht '5\' 6"'$  (1.676 m)   Wt 207 lb 3.2 oz (94 kg)   BMI 33.44 kg/m   Wt Readings from Last 3 Encounters:  02/24/16 207 lb 3.2 oz (94 kg)  01/07/16 204 lb (92.5 kg)  10/28/15 209 lb 12.8 oz (95.2 kg)     Gen: NAD, alert, cooperative with exam, NCAT EYES: EOMI, no conjunctival injection, or no icterus CV: NRRR, normal S1/S2, no murmur, distal pulses 2+ b/l Resp: CTABL, no wheezes, normal WOB Ext: Non-pitting edema present b/l, warm Neuro: Alert and oriented     Assessment & Plan:  Renee Pratt was seen today for follow-up multiple med problems.  Diagnoses and all orders for this visit:  Essential hypertension Well controlled, cont meds Due for labs -     BMP8+EGFR  DVT, lower extremity, recurrent, right ([place]) Has greenfield filter  Encounter for long-term (current) use of high-risk medication INR slightly low, 1.8 today Had kale last night Take half tab extra today, then back to normal dosing Recheck 2 weeks -     CoaguChek XS/INR Waived  Obesity Cont increased physical activity Cont fruit/veg  Need for hepatitis C screening test -     Hepatitis C antibody  Follow up  plan: Return in 2 weeks  [name], MD [place] 02/24/2016, 11:43 AM

## 2016-02-25 LAB — BMP8+EGFR
BUN/Creatinine Ratio: 16 (ref 12–28)
BUN: 13 mg/dL (ref 8–27)
CALCIUM: 9.2 mg/dL (ref 8.7–10.3)
CO2: 25 mmol/L (ref 18–29)
Chloride: 100 mmol/L (ref 96–106)
Creatinine, Ser: 0.83 mg/dL (ref 0.57–1.00)
GFR calc Af Amer: 84 mL/min/{1.73_m2} (ref >59)
GFR, EST NON AFRICAN AMERICAN: 73 mL/min/{1.73_m2} (ref >59)
GLUCOSE: 92 mg/dL (ref 65–99)
POTASSIUM: 3.5 mmol/L (ref 3.5–5.2)
Sodium: 142 mmol/L (ref 134–144)

## 2016-02-25 LAB — HEPATITIS C ANTIBODY

## 2016-03-09 ENCOUNTER — Ambulatory Visit (INDEPENDENT_AMBULATORY_CARE_PROVIDER_SITE_OTHER): Payer: Medicare Other | Admitting: Pharmacist

## 2016-03-09 DIAGNOSIS — Z86718 Personal history of other venous thrombosis and embolism: Secondary | ICD-10-CM

## 2016-03-09 DIAGNOSIS — Z7901 Long term (current) use of anticoagulants: Secondary | ICD-10-CM

## 2016-03-09 DIAGNOSIS — I82401 Acute embolism and thrombosis of unspecified deep veins of right lower extremity: Secondary | ICD-10-CM

## 2016-03-09 LAB — COAGUCHEK XS/INR WAIVED
INR: 3.1 — ABNORMAL HIGH (ref 0.9–1.1)
Prothrombin Time: 36.9 s

## 2016-03-17 ENCOUNTER — Ambulatory Visit: Payer: Self-pay | Admitting: Cardiology

## 2016-03-23 ENCOUNTER — Other Ambulatory Visit: Payer: Self-pay | Admitting: Family Medicine

## 2016-03-28 DIAGNOSIS — M25532 Pain in left wrist: Secondary | ICD-10-CM | POA: Diagnosis not present

## 2016-03-29 ENCOUNTER — Ambulatory Visit (INDEPENDENT_AMBULATORY_CARE_PROVIDER_SITE_OTHER): Payer: Medicare Other | Admitting: Psychiatry

## 2016-03-29 ENCOUNTER — Encounter (HOSPITAL_COMMUNITY): Payer: Self-pay | Admitting: Psychiatry

## 2016-03-29 DIAGNOSIS — F329 Major depressive disorder, single episode, unspecified: Secondary | ICD-10-CM | POA: Diagnosis not present

## 2016-03-29 DIAGNOSIS — F32A Depression, unspecified: Secondary | ICD-10-CM
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
## 2016-03-31 ENCOUNTER — Other Ambulatory Visit: Payer: Self-pay | Admitting: Family Medicine

## 2016-04-06 ENCOUNTER — Ambulatory Visit (INDEPENDENT_AMBULATORY_CARE_PROVIDER_SITE_OTHER): Payer: Self-pay | Admitting: Pharmacist

## 2016-04-06 DIAGNOSIS — Z7901 Long term (current) use of anticoagulants: Secondary | ICD-10-CM | POA: Diagnosis not present

## 2016-04-06 DIAGNOSIS — Z23 Encounter for immunization: Secondary | ICD-10-CM

## 2016-04-06 DIAGNOSIS — I82401 Acute embolism and thrombosis of unspecified deep veins of right lower extremity: Secondary | ICD-10-CM

## 2016-04-06 LAB — COAGUCHEK XS/INR WAIVED
INR: 2.7 — AB (ref 0.9–1.1)
PROTHROMBIN TIME: 33 s

## 2016-04-12 ENCOUNTER — Other Ambulatory Visit: Payer: Self-pay | Admitting: Family Medicine

## 2016-04-17 ENCOUNTER — Ambulatory Visit (INDEPENDENT_AMBULATORY_CARE_PROVIDER_SITE_OTHER): Payer: Medicare Other | Admitting: Psychiatry

## 2016-04-17 DIAGNOSIS — F329 Major depressive disorder, single episode, unspecified: Secondary | ICD-10-CM

## 2016-04-17 DIAGNOSIS — F32A Depression, unspecified: Secondary | ICD-10-CM

## 2016-04-17 NOTE — Progress Notes (Signed)
Patient:  Renee Pratt   DOB: 1946/02/26  MR Number: [id number]  Location: [facility]:  [address] [place],  [place], [address]  Start: Monday 04/17/2016 3:10 PM End: Monday 04/17/2016 4:00 PM  Provider/Observer:     Maurice Small, MSW, LCSW   Chief Complaint:      Chief Complaint  Patient presents with  . Depression    Reason For Service:  Patient is a 70 year old female who presents with symptoms of depression and anxiety that have been present about 2 years. She retired 4 years ago and didn't have a plan about what to do. She moved from [place] to [place], [place]. Most of her family lives in [place] and she has a son who resides in [place]. She misses family and has limited social involvement here. She states being lonely, feeling down, having a tendency to worry, and feeling restless. Patient has had no psychiatric hospitalizations. She reports being seen once in this practice for psychotherapy . Current symptoms include depressed mood, anxiety, poor concentration, and irritability.      Interventions Strategy:  Supportive  Participation Level:   Active  Participation Quality:  Appropriate      Behavioral Observation:  Casual, Alert, and Appropriate.   Current Psychosocial Factors: Living far away from family, living situation  Content of Session:   Established rapport, reviewed symptoms, gathered more information regarding interests and support system, facilitated expression of feelings, assisted patient identify ways to increase involvement in positive relationships  Current Status:   Depressed mood, anxiety, poor concentration, irritability.  Patient Progress:   Fair. Patient reports little to no change in symptoms since assessment session. She reports being down a couple of times but trying  to become involved in activities. Patient reports being very lonely. She is contemplating moving to [place] so she can be near her son. However, she expresses ambivalent  feelings about this.  Target Goals:   1.Establish rapport  2. Increase involvement in pleasurable activities and positive social contact.   Last Reviewed:     Goals Addressed Today:    1,3  Impression/Diagnosis:   Patient presents with symptoms of depression and anxiety that have been present for about 2 years. Symptoms appear to have been precipitated by her move which is a great distance from family and lifetime friends and loneliness. Current symptoms include depressed mood, anxiety, poor concentration, irritability.   Diagnosis:  Axis I: Depressive disorder          Axis II: Deferred    Efrem Pitstick, LCSW 04/17/2016

## 2016-05-03 NOTE — Progress Notes (Signed)
Cardiology Office Note   Date:  05/05/2016   ID:  Renee Pratt, DOB 1945-12-19, MRN [id number]  PCP:  Eustaquio Maize, MD  Cardiologist:   Minus Breeding, MD  Referring:  Eustaquio Maize, MD  Chief Complaint  Patient presents with  . Palpitations      History of Present Illness: Renee Pratt is a 70 y.o. female who presents for evaluation of a sensation like her chest is shivering. She's had no past cardiac history other than DVTs and an IVC filter.  However, she mentioned to her primary provider that she occasionally feels this sensation like she is shivering inside. She's not sure that this is palpitations. She's not sure that her heart rate is increased. She doesn't have any presyncope or syncope. It might happen about once a month. She is otherwise not had any significant cardiac problems. She does some exercise although recently she hurt her wrist and hasn't been doing this. When she was doing exercises she denied any cardiovascular symptoms.The patient denies any new symptoms such as chest discomfort, neck or arm discomfort. There has been no new shortness of breath, PND or orthopnea. There have been no reported presyncope or syncope.  Past Medical History:  Diagnosis Date  . Blood clotting tendency ([facility])   . Cataract   . DDD (degenerative disc disease), lumbar   . 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 First time 2011, another in 2012, 03/2013  . GERD (gastroesophageal reflux disease)    hiatal hernia  . 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 Hiatal hernia   . Hyperlipidemia   . Hypertension   . Uterine fibroid   . Vaginal Pap smear, abnormal     Past Surgical History:  Procedure Laterality Date  . CERVICAL BIOPSY    . COLPOSCOPY    . INSERTION OF VENA CAVA FILTER  01-17-2011   dr pindr # [phone]  [place]     Current Outpatient Prescriptions  Medication Sig Dispense  Refill  . atorvastatin (LIPITOR) 40 MG tablet Take 1 tablet (40 mg total) by mouth daily. 90 tablet 1  . cholecalciferol (VITAMIN D) 1000 UNITS tablet Take 2,000 Units by mouth daily.    Marland Kitchen docusate sodium (COLACE) 100 MG capsule Take 100 mg by mouth daily as needed for mild constipation.    . hydrochlorothiazide (HYDRODIURIL) 25 MG tablet TAKE ONE TABLET BY MOUTH ONCE DAILY 30 tablet 4  . losartan (COZAAR) 100 MG tablet TAKE ONE TABLET BY MOUTH ONCE DAILY 90 tablet 0  . Multiple Vitamins-Minerals (CENTRUM SILVER PO) Take 1 tablet by mouth every other day.    Marland Kitchen NIFEdipine (PROCARDIA XL/ADALAT-CC) 60 MG 24 hr tablet TAKE ONE TABLET BY MOUTH ONCE DAILY 30 tablet 3  . polyethylene glycol powder (GLYCOLAX/MIRALAX) powder Take 17 g by mouth daily. 528 g 1  . warfarin (COUMADIN) 5 MG tablet TAKE ONE TO ONE & ONE-HALF TABLETS BY MOUTH ONCE DAILY AS DIRECTED 100 tablet 0   No current facility-administered medications for this visit.     Allergies:   Tetracyclines & related    Social History:  The patient  reports that she quit smoking about 18 years ago. Her smoking use included Cigarettes. She has a 5.00 pack-year smoking history. She has never used smokeless tobacco. She reports that she drinks alcohol. She reports that she does not use drugs.  Family History:  The patient's family history includes AAA (abdominal aortic aneurysm) in her mother; Cancer (age of onset: 11) in her father; Colon cancer in her cousin; Depression in her maternal grandmother and son; Hypertension in her mother; Kidney disease in her mother; Other in her cousin; Stroke (age of onset: 64) in her mother.    ROS:  Please see the history of present illness.   Otherwise, review of systems are positive for none.   All other systems are reviewed and negative.    PHYSICAL EXAM: VS:  BP 135/83 Comment: took medication at 9:45  Pulse 96   Ht 5\' 6"  (1.676 m)   Wt 207 lb (93.9 kg)   SpO2 93%   BMI 33.41 kg/m  , BMI Body mass  index is 33.41 kg/m. GENERAL:  Well appearing HEENT:  Pupils equal round and reactive, fundi not visualized, oral mucosa unremarkable NECK:  No jugular venous distention, waveform within normal limits, carotid upstroke brisk and symmetric, no bruits, no thyromegaly LYMPHATICS:  No cervical, inguinal adenopathy LUNGS:  Clear to auscultation bilaterally BACK:  No CVA tenderness CHEST:  Unremarkable HEART:  PMI not displaced or sustained,S1 and S2 within normal limits, no S3, no S4, no clicks, no rubs, no murmurs ABD:  Flat, positive bowel sounds normal in frequency in pitch, no bruits, no rebound, no guarding, no midline pulsatile mass, no hepatomegaly, no splenomegaly EXT:  2 plus pulses throughout, no edema, no cyanosis no clubbing SKIN:  No rashes no nodules NEURO:  Cranial nerves II through XII grossly intact, motor grossly intact throughout PSYCH:  Cognitively intact, oriented to person place and time    EKG:  EKG is ordered today. The ekg ordered today demonstrates sinus rhythm, rate 84, axis within normal limits, intervals within normal limits, no acute ST-T wave changes.   Recent Labs: 02/24/2016: BUN 13; Creatinine, Ser 0.83; Potassium 3.5; Sodium 142    Lipid Panel    Component Value Date/Time   CHOL 226 (H) 12/08/2014 1634   CHOL 231 (H) 12/18/2013 1145   CHOL 236 (H) 12/19/2012 1126   TRIG 145 12/08/2014 1634   TRIG 111 12/19/2012 1126   HDL 49 12/08/2014 1634   HDL 48 12/19/2012 1126   CHOLHDL 4.3 12/18/2013 1145   LDLCALC 156 (H) 12/18/2013 1145   LDLCALC 235 (H) 10/09/2013 1143   LDLCALC 166 (H) 12/19/2012 1126      Wt Readings from Last 3 Encounters:  05/05/16 207 lb (93.9 kg)  02/24/16 207 lb 3.2 oz (94 kg)  01/07/16 204 lb (92.5 kg)      Other studies Reviewed: Additional studies/ records that were reviewed today include: None. Review of the above records demonstrates:  Please see elsewhere in the note.     ASSESSMENT AND PLAN:  "SHIVERING  INSIDE":  I don't think that this represents arrhythmia but I taught her about taking her heart rate and her pulse when this is happening. I don't think the frequency is enough to even warrant an event monitor. Otherwise I don't think cardiovascular testing is indicated.  HTN:  Her blood pressure seems to be controlled. She'll continue the meds as listed.  HISTORY OF DVT:  She has an IVC filter.  She will continue on anticoagulation.  Current medicines are reviewed at length with the patient today.  The patient does not have concerns regarding medicines.  The following changes have been made:  no change  Labs/ tests ordered today include:   Orders Placed This Encounter  Procedures  . EKG 12-Lead     Disposition:   FU with me as needed.      Signed, Minus Breeding, MD  05/05/2016 12:25 PM    [facility]

## 2016-05-04 ENCOUNTER — Encounter: Payer: Self-pay | Admitting: Cardiology

## 2016-05-05 ENCOUNTER — Encounter: Payer: Self-pay | Admitting: Cardiology

## 2016-05-05 ENCOUNTER — Ambulatory Visit (INDEPENDENT_AMBULATORY_CARE_PROVIDER_SITE_OTHER): Payer: Medicare Other | Admitting: Cardiology

## 2016-05-05 VITALS — BP 135/83 | HR 96 | Ht 66.0 in | Wt 207.0 lb

## 2016-05-05 DIAGNOSIS — I10 Essential (primary) hypertension: Secondary | ICD-10-CM | POA: Diagnosis not present

## 2016-05-05 DIAGNOSIS — R002 Palpitations: Secondary | ICD-10-CM

## 2016-05-05 NOTE — Patient Instructions (Signed)
Medication Instructions:  Continue all current medications.  Labwork: none  Testing/Procedures: none  Follow-Up: As needed.    Any Other Special Instructions Will Be Listed Below (If Applicable).  If you need a refill on your cardiac medications before your next appointment, please call your pharmacy.  

## 2016-05-08 ENCOUNTER — Ambulatory Visit (HOSPITAL_COMMUNITY): Payer: Self-pay | Admitting: Psychiatry

## 2016-05-09 ENCOUNTER — Encounter: Payer: Self-pay | Admitting: Pharmacist

## 2016-05-15 ENCOUNTER — Ambulatory Visit (INDEPENDENT_AMBULATORY_CARE_PROVIDER_SITE_OTHER): Payer: Medicare Other | Admitting: Pharmacist

## 2016-05-15 DIAGNOSIS — I82401 Acute embolism and thrombosis of unspecified deep veins of right lower extremity: Secondary | ICD-10-CM | POA: Diagnosis not present

## 2016-05-15 DIAGNOSIS — S6992XS Unspecified injury of left wrist, hand and finger(s), sequela: Secondary | ICD-10-CM

## 2016-05-15 DIAGNOSIS — Z7901 Long term (current) use of anticoagulants: Secondary | ICD-10-CM | POA: Diagnosis not present

## 2016-05-15 LAB — COAGUCHEK XS/INR WAIVED
INR: 1.7 — ABNORMAL HIGH (ref 0.9–1.1)
PROTHROMBIN TIME: 20.8 s

## 2016-05-16 ENCOUNTER — Other Ambulatory Visit: Payer: Self-pay | Admitting: Family Medicine

## 2016-05-22 ENCOUNTER — Encounter (HOSPITAL_COMMUNITY): Payer: Self-pay | Admitting: Psychiatry

## 2016-05-22 ENCOUNTER — Ambulatory Visit (INDEPENDENT_AMBULATORY_CARE_PROVIDER_SITE_OTHER): Payer: Medicare Other | Admitting: Psychiatry

## 2016-05-22 DIAGNOSIS — F32A Depression, unspecified: Secondary | ICD-10-CM

## 2016-05-22 DIAGNOSIS — F329 Major depressive disorder, single episode, unspecified: Secondary | ICD-10-CM | POA: Diagnosis not present

## 2016-05-22 NOTE — Progress Notes (Signed)
Patient:  Renee Pratt   DOB: Sep 30, 1945  MR Number: [id number]  Location: [facility]:  [address] [place],  [place], [address]  Start: Monday  05/22/2016  1:15 PM End: Monday  05/22/2016  1:50 PM                  Provider/Observer:     Maurice Small, MSW, LCSW   Chief Complaint:      Chief Complaint  Patient presents with  . Depression    Reason For Service:  Patient is a 70 year old female who presents with symptoms of depression and anxiety that have been present about 2 years. She retired 4 years ago and didn't have a plan about what to do. She moved from [place] to [place], [place]. Most of her family lives in [place] and she has a son who resides in [place]. She misses family and has limited social involvement here. She states being lonely, feeling down, having a tendency to worry, and feeling restless. Patient has had no psychiatric hospitalizations. She reports being seen once in this practice for psychotherapy . Current symptoms include depressed mood, anxiety, poor concentration, and irritability.      Interventions Strategy:  Supportive  Participation Level:   Active  Participation Quality:  Appropriate      Behavioral Observation:  Casual, Alert, and Appropriate.   Current Psychosocial Factors: Living far away from family, living situation  Content of Session:    reviewed symptoms, facilitated expression of feelings, praised and reinforced patient's increased involvement in activity, assisted patient identify ways to improve self-care regarding sleep hygiene  Current Status:   Less depressed mood, fatigue, continued anxiety  Patient Progress:   [place]. Patient reports having ups and down but being less depressed since last session. She has been more involved in activities and reports being less lonely. She has been visiting friends, doing ministry work, going shopping, going out to lunch with friends, walking in the park, and using adult coloring books.  She has made a decision to definitely moved from her current residence by next spring but still does not know where she wants to move. Patient is looking forward to visiting her son in [place] for the Thanksgiving holiday.  Target Goals:   1. Increase involvement in pleasurable activities and positive social contact.   Last Reviewed:     Goals Addressed Today:    1  Impression/Diagnosis:   Patient presents with symptoms of depression and anxiety that have been present for about 2 years. Symptoms appear to have been precipitated by her move which is a great distance from family and lifetime friends and loneliness. Current symptoms include depressed mood, anxiety, poor concentration, irritability.   Diagnosis:  Axis I:  Depressive Disorder          Axis II: Deferred    Vera Furniss, LCSW 05/22/2016

## 2016-05-31 ENCOUNTER — Telehealth: Payer: Self-pay | Admitting: Pediatrics

## 2016-05-31 NOTE — Telephone Encounter (Signed)
Pt aware these are otc

## 2016-06-08 ENCOUNTER — Encounter (HOSPITAL_COMMUNITY): Payer: Self-pay | Admitting: Psychiatry

## 2016-06-08 ENCOUNTER — Ambulatory Visit (INDEPENDENT_AMBULATORY_CARE_PROVIDER_SITE_OTHER): Payer: Medicare Other | Admitting: Psychiatry

## 2016-06-08 DIAGNOSIS — F329 Major depressive disorder, single episode, unspecified: Secondary | ICD-10-CM | POA: Diagnosis not present

## 2016-06-08 DIAGNOSIS — F32A Depression, unspecified: Secondary | ICD-10-CM

## 2016-06-08 NOTE — Progress Notes (Signed)
Patient:  Renee Pratt   DOB: October 28, 1945  MR Number: [id number]  Location: [place]:  [address] [place],  [place], [address]  Start: Thursday 06/08/2016 1:15 PM End: Thursday 06/08/2016 2:00 PM             Provider/Observer:     Maurice Small, MSW, LCSW   Chief Complaint:      Chief Complaint  Patient presents with  . Depression    Reason For Service:  Patient is a 70 year old female who presents with symptoms of depression and anxiety that have been present about 2 years. She retired 4 years ago and didn't have a plan about what to do. She moved from [place] to [place], [place]. Most of her family lives in [place] and she has a son who resides in [place]. She misses family and has limited social involvement here. She states being lonely, feeling down, having a tendency to worry, and feeling restless. Patient has had no psychiatric hospitalizations. She reports being seen once in this practice for psychotherapy . Current symptoms include depressed mood, anxiety, poor concentration, and irritability.      Interventions Strategy:  Supportive  Participation Level:   Active  Participation Quality:  Appropriate      Behavioral Observation:  Casual, Alert, and Appropriate.   Current Psychosocial Factors: Living far away from family, living situation, interpersonal conflict  Content of Session:    reviewed symptoms, facilitated expression of feelings, praised and reinforced patient's increased involvement in activity, examined patient's pattern of interactions and relationships, assisted patient identify realistic expectations in her relationships, discussed handout, "Finding the Should Behind your Anger ", assigned patient to review handout  between sessions  Current Status:   Less depressed mood, fatigue, continued anxiety  Patient Progress:   Fair. Patient reports continued involvement in activity and going to visit her son in [place] for Thanksgiving. However, she  reports being lonely and feeling depressed. She expresses frustration regarding various relationships and reports trust issues. She expresses disappointment regarding one of her friends and their recent interaction. She reports a pattern of blocking people off when they do not meet her expectations.  Target Goals:   1. Increase involvement in pleasurable activities and positive social contact.   Last Reviewed:     Goals Addressed Today:    1  Impression/Diagnosis:   Patient presents with symptoms of depression and anxiety that have been present for about 2 years. Symptoms appear to have been precipitated by her move which is a great distance from family and lifetime friends and loneliness. Current symptoms include depressed mood, anxiety, poor concentration, irritability.   Diagnosis:  Axis I:  Depressive Disorder          Axis II: Deferred    Kirat Mezquita, LCSW 06/08/2016

## 2016-06-11 ENCOUNTER — Other Ambulatory Visit: Payer: Self-pay | Admitting: Pediatrics

## 2016-06-14 ENCOUNTER — Other Ambulatory Visit: Payer: Self-pay | Admitting: Internal Medicine

## 2016-06-14 ENCOUNTER — Encounter: Payer: Self-pay | Admitting: Pharmacist

## 2016-06-14 ENCOUNTER — Encounter: Payer: Self-pay | Admitting: Pediatrics

## 2016-06-14 ENCOUNTER — Ambulatory Visit (INDEPENDENT_AMBULATORY_CARE_PROVIDER_SITE_OTHER): Payer: Medicare Other | Admitting: Pediatrics

## 2016-06-14 ENCOUNTER — Other Ambulatory Visit: Payer: Self-pay | Admitting: Pediatrics

## 2016-06-14 VITALS — BP 135/76 | HR 82 | Temp 97.8°F | Ht 66.0 in | Wt 210.2 lb

## 2016-06-14 DIAGNOSIS — M25532 Pain in left wrist: Secondary | ICD-10-CM

## 2016-06-14 DIAGNOSIS — I10 Essential (primary) hypertension: Secondary | ICD-10-CM

## 2016-06-14 DIAGNOSIS — I82401 Acute embolism and thrombosis of unspecified deep veins of right lower extremity: Secondary | ICD-10-CM

## 2016-06-14 DIAGNOSIS — Z7901 Long term (current) use of anticoagulants: Secondary | ICD-10-CM | POA: Diagnosis not present

## 2016-06-14 DIAGNOSIS — R2231 Localized swelling, mass and lump, right upper limb: Secondary | ICD-10-CM

## 2016-06-14 DIAGNOSIS — F329 Major depressive disorder, single episode, unspecified: Secondary | ICD-10-CM

## 2016-06-14 DIAGNOSIS — E785 Hyperlipidemia, unspecified: Secondary | ICD-10-CM

## 2016-06-14 DIAGNOSIS — F32A Depression, unspecified: Secondary | ICD-10-CM

## 2016-06-14 LAB — COAGUCHEK XS/INR WAIVED
INR: 1.5 — AB (ref 0.9–1.1)
PROTHROMBIN TIME: 18 s

## 2016-06-14 MED ORDER — LOSARTAN POTASSIUM 100 MG PO TABS: 100.0000 mg | ORAL_TABLET | Freq: Every day | ORAL | 1 refills | Status: DC

## 2016-06-14 MED ORDER — ATORVASTATIN CALCIUM 40 MG PO TABS: 40.0000 mg | ORAL_TABLET | Freq: Every day | ORAL | 1 refills | Status: DC

## 2016-06-14 MED ORDER — WARFARIN SODIUM 5 MG PO TABS: ORAL_TABLET | ORAL | 0 refills | Status: DC

## 2016-06-14 NOTE — Patient Instructions (Addendum)
Warfarin  Mon: 7.5mg  Tues: 5mg  Wed: 7.5mg  Thurs: 5mg  Fri: 7.5 Sat/Sun: 5mg 

## 2016-06-14 NOTE — Progress Notes (Signed)
Subjective:   Patient ID: Renee Pratt, female    DOB: [birth year], 70 y.o.   MRN: [id number] CC: Breast Mass (right arm)  HPI: Renee Pratt is a 70 y.o. female presenting for Breast Mass (right arm)  Has been in counseling for depresion Thinks it has been helping a lot Looks forward to going  Has small nodule under R arm Thinks has been getting bigger over last few weeks to months Has been there for years Was using a deodorant that was irritating to her skin within last few weeks, started her noticing area more No lumps or bumps awaywhere else Last mammogram normal 10/08/2015  Had cabbage twice this week Greens, string beans Ate more vegetable servings than   HLD: taking atorvastatin regularly, no s/e  HTN: taking meds regularly, no CP, no SOB  Wrist pain: a couple months ago injured wrist at silver sneakers, still bothering her Has appt to f/u with ortho Keeps wrist in wrist plint, helps during the day Needs new splint  No fam hx of breast ca  Relevant past medical, surgical, family and social history reviewed. Allergies and medications reviewed and updated. History  Smoking Status  . Former Smoker  . Packs/day: 0.25  . Years: 20.00  . Types: Cigarettes  . Quit date: 10/17/1997  Smokeless Tobacco  . Never Used    Comment: Quit x 25 years   ROS: Per HPI   Objective:    BP 135/76   Pulse 82   Temp 97.8 F (36.6 C) (Oral)   Ht 5\' 6"  (1.676 m)   Wt 210 lb 3.2 oz (95.3 kg)   BMI 33.93 kg/m   Wt Readings from Last 3 Encounters:  06/14/16 210 lb 3.2 oz (95.3 kg)  05/05/16 207 lb (93.9 kg)  02/24/16 207 lb 3.2 oz (94 kg)   Gen: NAD, alert, cooperative with exam, NCAT EYES: EOMI, no conjunctival injection, or no icterus LYMPH: no cervical LAD CV: NRRR, normal S1/S2 Resp: CTABL, no wheezes, normal WOB Ext: trace pitting edema, warm Neuro: Alert and oriented MSK: L wrist slightly swollen compared with R, slightly decreased ROM with ext/flex L wrist  compared with R, minimal pain with inversion/eversion Breast: R axilla with apprx 9mm hard nodule  Assessment & Plan:  Jaylan was seen today for breast mass.  Diagnoses and all orders for this visit:  Axillary mass, right Has had nodule for years, thinks getting bigger, more tender Never had biopsy or ultrasound, was told it was a cyst at some point Will get diagnostic mammogram, [place]  Chronic anticoagulation INR 1.5 Increased greens last week, more than what she usually takes Will increase to 7.5 mg three days a week )MWF) Cont 5 mg T/R/Sa/Su Recheck 2 weeks -     CoaguChek XS/INR Waived -     warfarin (COUMADIN) 5 MG tablet; TAKE ONE TO ONE & ONE-HALF TABLETS BY MOUTH ONCE DAILY AS DIRECTED  Left wrist pain Has f/u with ortho Gave wrist splint today Use as needed for pain Splint helps her cont normal ADLs with less pain  Essential hypertension Well controlle,d cont below -     losartan (COZAAR) 100 MG tablet; Take 1 tablet (100 mg total) by mouth daily.  Hyperlipidemia, unspecified hyperlipidemia type Stable, Cont below -     atorvastatin (LIPITOR) 40 MG tablet; Take 1 tablet (40 mg total) by mouth daily.  Depression Stable Cont counseling Not on meds  Follow up plan: Return in about 2 weeks (around 06/28/2016) for  anticoag viist with tammy. [name], MD [place]

## 2016-06-15 ENCOUNTER — Telehealth: Payer: Self-pay | Admitting: Pediatrics

## 2016-06-15 NOTE — Telephone Encounter (Signed)
Has a "mosquito lump" on her R side that has been there since the summer. Not itching. Not growing. Will take a look at it the next time she comes in next week for INR check.

## 2016-06-15 NOTE — Telephone Encounter (Signed)
Pt only wants to speak with Dr Evette Doffing Would not give details

## 2016-06-20 ENCOUNTER — Other Ambulatory Visit: Payer: Self-pay

## 2016-06-22 ENCOUNTER — Ambulatory Visit (INDEPENDENT_AMBULATORY_CARE_PROVIDER_SITE_OTHER): Payer: Medicare Other | Admitting: Psychiatry

## 2016-06-22 ENCOUNTER — Encounter (HOSPITAL_COMMUNITY): Payer: Self-pay | Admitting: Psychiatry

## 2016-06-22 DIAGNOSIS — F329 Major depressive disorder, single episode, unspecified: Secondary | ICD-10-CM

## 2016-06-22 DIAGNOSIS — F32A Depression, unspecified: Secondary | ICD-10-CM

## 2016-06-22 NOTE — Progress Notes (Signed)
Patient:  Renee Pratt   DOB: [birth year]  MR Number: [id number]  Location: [place]:  [address] [place],  [place], [address]  Start: Thursday 06/22/2016 2:15 PM End: Thursday 06/22/2016 3:05 PM      Provider/Observer:     Maurice Small, MSW, LCSW   Chief Complaint:     10 minutes. The template okay   No chief complaint on file.   Reason For Service:  Patient is a 70 year old female who presents with symptoms of depression and anxiety that have been present about 2 years. She retired 4 years ago and didn't have a plan about what to do. She moved from [place] to [place], [place]. Most of her family lives in [place] and she has a son who resides in [place]. She misses family and has limited social involvement here. She states being lonely, feeling down, having a tendency to worry, and feeling restless. Patient has had no psychiatric hospitalizations. She reports being seen once in this practice for psychotherapy . Current symptoms include depressed mood, anxiety, poor concentration, and irritability.      Interventions Strategy:  Supportive  Participation Level:   Active  Participation Quality:  Appropriate      Behavioral Observation:  Casual, Alert, and Appropriate.   Current Psychosocial Factors: Living far away from family, living situation, interpersonal conflict  Content of Session:    reviewed symptoms, facilitated expression of feelings, praised and reinforced patient's  involvement in activity and increased awareness of thought patterns, assisted patient identify strengths and supports, develop treatment plan, begin to help patient identify previously enjoyed activities in social interactions, assigned patient to make a list of these and bring to next session   Current Status:   Less depressed mood, fatigue, continued anxiety, feelings of loneliness  Patient Progress:   Fair. Patient reports continued involvement in activity and attending her congregational  meetings but having little other socialization. She continues to experience feelings of loneliness and isolation. She has been trying to become more aware of her thought patterns and trying to change unrealistic demands to desires.    Target Goals:   1. Learn and implement behavioral strategies to overcome depression.    2. Learn and implement problem-solving and decision-making skills  Last Reviewed:   06/22/2016  Goals Addressed Today:    1,2  Impression/Diagnosis:   Patient presents with symptoms of depression and anxiety that have been present for about 2 years. Symptoms appear to have been precipitated by her move which is a great distance from family and lifetime friends and loneliness. Current symptoms include depressed mood, anxiety, poor concentration, irritability.   Diagnosis:  Axis I:  Depressive Disorder          Axis II: Deferred    Evelyne Makepeace, LCSW 06/22/2016

## 2016-06-23 ENCOUNTER — Other Ambulatory Visit: Payer: Self-pay

## 2016-06-23 ENCOUNTER — Encounter: Payer: Self-pay | Admitting: Pharmacist Clinician (PhC)/ Clinical Pharmacy Specialist

## 2016-07-06 ENCOUNTER — Ambulatory Visit
Admission: RE | Admit: 2016-07-06 | Discharge: 2016-07-06 | Disposition: A | Discharge status: Home / Self Care | Payer: Medicare Other | Source: Ambulatory Visit | Attending: Pediatrics | Admitting: Pediatrics

## 2016-07-06 DIAGNOSIS — R2231 Localized swelling, mass and lump, right upper limb: Secondary | ICD-10-CM

## 2016-07-12 ENCOUNTER — Ambulatory Visit (INDEPENDENT_AMBULATORY_CARE_PROVIDER_SITE_OTHER): Payer: Medicare Other | Admitting: Pharmacist

## 2016-07-12 DIAGNOSIS — Z7901 Long term (current) use of anticoagulants: Secondary | ICD-10-CM

## 2016-07-12 DIAGNOSIS — Z86718 Personal history of other venous thrombosis and embolism: Secondary | ICD-10-CM | POA: Insufficient documentation

## 2016-07-12 DIAGNOSIS — I82401 Acute embolism and thrombosis of unspecified deep veins of right lower extremity: Secondary | ICD-10-CM

## 2016-07-12 DIAGNOSIS — D689 Coagulation defect, unspecified: Secondary | ICD-10-CM | POA: Diagnosis not present

## 2016-07-12 DIAGNOSIS — Z79899 Other long term (current) drug therapy: Secondary | ICD-10-CM | POA: Diagnosis not present

## 2016-07-12 LAB — COAGUCHEK XS/INR WAIVED
INR: 2.4 — ABNORMAL HIGH (ref 0.9–1.1)
Prothrombin Time: 29 s

## 2016-07-19 ENCOUNTER — Ambulatory Visit (HOSPITAL_COMMUNITY): Payer: Self-pay | Admitting: Psychiatry

## 2016-07-28 ENCOUNTER — Ambulatory Visit: Payer: Self-pay | Admitting: Orthopedic Surgery

## 2016-08-02 ENCOUNTER — Encounter: Payer: Self-pay | Admitting: Orthopedic Surgery

## 2016-08-02 ENCOUNTER — Ambulatory Visit (INDEPENDENT_AMBULATORY_CARE_PROVIDER_SITE_OTHER): Payer: Medicare Other | Admitting: Orthopedic Surgery

## 2016-08-02 VITALS — BP 142/77 | HR 87 | Ht 66.0 in | Wt 209.0 lb

## 2016-08-02 DIAGNOSIS — M654 Radial styloid tenosynovitis [de Quervain]: Secondary | ICD-10-CM | POA: Diagnosis not present

## 2016-08-02 NOTE — Patient Instructions (Addendum)
WEAR BRACE FOR SIX WEEKS  De Quervain Tenosynovitis Introduction Tendons attach muscles to bones. They also help with joint movements. When tendons become irritated or swollen, it is called tendinitis. The extensor pollicis brevis (EPB) tendon connects the EPB muscle to a bone that is near the base of the thumb. The EPB muscle helps to straighten and extend the thumb. De Quervain tenosynovitis is a condition in which the EPB tendon lining (sheath) becomes irritated, thickened, and swollen. This condition is sometimes called stenosing tenosynovitis. This condition causes pain on the thumb side of the back of the wrist. What are the causes? Causes of this condition include:  Activities that repeatedly cause your thumb and wrist to extend.  A sudden increase in activity or change in activity that affects your wrist. What increases the risk? This condition is more likely to develop in:  Females.  People who have diabetes.  Women who have recently given birth.  People who are over 38 years of age.  People who do activities that involve repeated hand and wrist motions, such as tennis, racquetball, volleyball, gardening, and taking care of children.  People who do heavy labor.  People who have poor wrist strength and flexibility.  People who do not warm up properly before activities. What are the signs or symptoms? Symptoms of this condition include:  Pain or tenderness over the thumb side of the back of the wrist when your thumb and wrist are not moving.  Pain that gets worse when you straighten your thumb or extend your thumb or wrist.  Pain when the injured area is touched.  Locking or catching of the thumb joint while you bend and straighten your thumb.  Decreased thumb motion due to pain.  Swelling over the affected area. How is this diagnosed? This condition is diagnosed with a medical history and physical exam. Your health care provider will ask for details about your  injury and ask about your symptoms. How is this treated? Treatment may include the use of icing and medicines to reduce pain and swelling. You may also be advised to wear a splint or brace to limit your thumb and wrist motion. In less severe cases, treatment may also include working with a physical therapist to strengthen your wrist and calm the irritation around your EPB tendon sheath. In severe cases, surgery may be needed. Follow these instructions at home: If you have a splint or brace:  Wear it as told by your health care provider. Remove it only as told by your health care provider.  Loosen the splint or brace if your fingers become numb and tingle, or if they turn cold and blue.  Keep the splint or brace clean and dry. Managing pain, stiffness, and swelling  If directed, apply ice to the injured area.  Put ice in a plastic bag.  Place a towel between your skin and the bag.  Leave the ice on for 20 minutes, 2-3 times per day.  Move your fingers often to avoid stiffness and to lessen swelling.  Raise (elevate) the injured area above the level of your heart while you are sitting or lying down. General instructions  Return to your normal activities as told by your health care provider. Ask your health care provider what activities are safe for you.  Take over-the-counter and prescription medicines only as told by your health care provider.  Keep all follow-up visits as told by your health care provider. This is important.  Do not drive or operate  heavy machinery while taking prescription pain medicine. Contact a health care provider if:  Your pain, tenderness, or swelling gets worse, even if you have had treatment.  You have numbness or tingling in your wrist, hand, or fingers on the injured side. This information is not intended to replace advice given to you by your health care provider. Make sure you discuss any questions you have with your health care provider. Document  Released: 06/26/2005 Document Revised: 12/02/2015 Document Reviewed: 09/01/2014  2017 Elsevier

## 2016-08-02 NOTE — Progress Notes (Signed)
Patient ID: Renee Pratt, female   DOB: [birth year], 71 y.o.   MRN: [id number]  Chief Complaint  Patient presents with  . New Patient (Initial Visit)    Left wrist pain    HPI Renee Pratt is a 71 y.o. female.  6 months ago the patient was doing exercises with silver sneakers felt a pop in her left wrist has had mild to moderate dorsal radial wrist pain since that time pain with ulnar deviation pain with lifting small objects. She describes a dull aching pain which radiates into the forearm.  Review of Systems Review of Systems  Skin: Negative for rash.  Neurological: Negative for numbness.    Past Medical History:  Diagnosis Date  . 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 Cataract   . DDD (degenerative disc disease), lumbar   . Depression   . 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 First time 2011, another in 2012, 03/2013  . GERD (gastroesophageal reflux disease)    hiatal hernia  . 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 Hiatal hernia   . Hyperlipidemia   . Hypertension   . Uterine fibroid   . Vaginal Pap smear, abnormal     Past Surgical History:  Procedure Laterality Date  . CERVICAL BIOPSY    . COLPOSCOPY    . INSERTION OF VENA CAVA FILTER  01-17-2011   dr pindr # [id number]  [place]    Social History Social History  Substance Use Topics  . Smoking status: Former Smoker    Packs/day: 0.25    Years: 20.00    Types: Cigarettes    Quit date: 10/17/1997  . Smokeless tobacco: Never Used     Comment: Quit x 25 years  . Alcohol use 0.0 oz/week     Comment: Socially    Allergies  Allergen Reactions  . Tetracyclines & Related     Nausea, pain    No outpatient prescriptions have been marked as taking for the 08/02/16 encounter (Office Visit) with Carole Civil, MD.      Physical Exam Physical Exam BP (!) 142/77   Pulse 87   Ht 5\' 6"  (1.676 m)   Wt 209 lb (94.8 kg)    BMI 33.73 kg/m   Gen. appearance. The patient is well-developed and well-nourished, grooming and hygiene are normal. There are no gross congenital abnormalities  The patient is alert and oriented to person place and time  Mood and affect are normal  Ambulation NORMAL  Examination reveals the following: On inspection we find tenderness over the first extensor compartment mild swelling FINKELSTEINS SIGN positive  With the range of motion of  NL  Stability tests were normal    Strength tests revealed grade 5 motor strength  Skin we find no rash ulceration or erythema  Sensation remains intact  Impression vascular system shows no peripheral edema  Data Reviewed Plain films were done at [facility] urgent care. They are in the PACS system they show no fracture but mild CMC arthritis  Assessment    Encounter Diagnosis  Name Primary?  Tennis Must Quervain's disease (radial styloid tenosynovitis) Yes       Plan    6 weeks of splinting, ice 3 times a day       Renee Pratt 08/02/2016, 11:25 AM

## 2016-08-03 ENCOUNTER — Encounter (HOSPITAL_COMMUNITY): Payer: Self-pay | Admitting: Psychiatry

## 2016-08-03 ENCOUNTER — Ambulatory Visit (INDEPENDENT_AMBULATORY_CARE_PROVIDER_SITE_OTHER): Payer: Medicare Other | Admitting: Psychiatry

## 2016-08-03 DIAGNOSIS — F32A Depression, unspecified: Secondary | ICD-10-CM

## 2016-08-03 DIAGNOSIS — F329 Major depressive disorder, single episode, unspecified: Secondary | ICD-10-CM | POA: Diagnosis not present

## 2016-08-03 NOTE — Progress Notes (Signed)
Patient:  Renee Pratt   DOB: [birth year]  MR Number: [id number]  Location: [facility]:  [address] [place],  [place], [address]  Start: Thursday 08/03/2016 1:25 PM End: Thursday 08/03/2016 1:55 PM          Provider/Observer:     Maurice Small, MSW, LCSW   Chief Complaint:        Chief Complaint  Patient presents with  . Depression    Reason For Service:  Patient is a 71 year old female who presents with symptoms of depression and anxiety that have been present about 2 years. She retired 4 years ago and didn't have a plan about what to do. She moved from [place] to [place], [place]. Most of her family lives in [place] and she has a son who resides in [place]. She misses family and has limited social involvement here. She states being lonely, feeling down, having a tendency to worry, and feeling restless. Patient has had no psychiatric hospitalizations. She reports being seen once in this practice for psychotherapy . Current symptoms include depressed mood, anxiety, poor concentration, and irritability.      Interventions Strategy:  Supportive  Participation Level:   Active  Participation Quality:  Appropriate      Behavioral Observation:  Casual, Alert, and Appropriate.   Current Psychosocial Factors: Living far away from family, living situation, interpersonal conflict  Content of Session:     facilitated expression of feelings, praised and reinforced patient's  involvement in activity and increased awareness of thought patterns, discussed pros and cons of staying here or moving to be near family, began to identify thoughts that inhibit patient's efforts to increase and explore social involvement, assisted patient to begin to identify other volunteer opportunities and avenues of social involvement.  Current Status:   Less depressed mood,  continued anxiety, feelings of loneliness  Patient Progress:   Fair. Patient reports she did research one volunteer opportunity  but did not go any further as some of the questions on application were very intrusive. 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 She continues to experience feelings of loneliness but says she does have 3 friends with whom she likes to socialize. However, they work and don't have as much free time as patient has. She has listed pros and cons about staying here or moving to be near family but still does not know what she wants to do  She has been trying to become more aware of her thought patterns and trying to change unrealistic demands to desires.    Target Goals:   1. Learn and implement behavioral strategies to overcome depression.    2. Learn and implement problem-solving and decision-making skills  Last Reviewed:   06/22/2016  Goals Addressed Today:    1,2  Impression/Diagnosis:   Patient presents with symptoms of depression and anxiety that have been present for about 2 years. Symptoms appear to have been precipitated by her move which is a great distance from family and lifetime friends and loneliness. Current symptoms include depressed mood, anxiety, poor concentration, irritability.   Diagnosis:  Axis I:  Depressive Disorder          Axis II: Deferred    Georjean Toya, LCSW 08/03/2016

## 2016-08-14 ENCOUNTER — Other Ambulatory Visit: Payer: Self-pay | Admitting: Pediatrics

## 2016-08-15 ENCOUNTER — Telehealth: Payer: Self-pay | Admitting: Pediatrics

## 2016-08-15 MED ORDER — NIFEDIPINE ER OSMOTIC RELEASE 60 MG PO TB24: 60.0000 mg | ORAL_TABLET | Freq: Every day | ORAL | 0 refills | Status: DC

## 2016-08-15 NOTE — Telephone Encounter (Signed)
done

## 2016-08-17 ENCOUNTER — Ambulatory Visit (INDEPENDENT_AMBULATORY_CARE_PROVIDER_SITE_OTHER): Payer: Medicare Other | Admitting: Pharmacist

## 2016-08-17 ENCOUNTER — Ambulatory Visit (HOSPITAL_COMMUNITY): Payer: Self-pay | Admitting: Psychiatry

## 2016-08-17 VITALS — BP 140/72

## 2016-08-17 DIAGNOSIS — I10 Essential (primary) hypertension: Secondary | ICD-10-CM | POA: Diagnosis not present

## 2016-08-17 DIAGNOSIS — Z79899 Other long term (current) drug therapy: Secondary | ICD-10-CM

## 2016-08-17 DIAGNOSIS — Z7901 Long term (current) use of anticoagulants: Secondary | ICD-10-CM | POA: Diagnosis not present

## 2016-08-17 DIAGNOSIS — Z86718 Personal history of other venous thrombosis and embolism: Secondary | ICD-10-CM

## 2016-08-17 DIAGNOSIS — D689 Coagulation defect, unspecified: Secondary | ICD-10-CM

## 2016-08-17 DIAGNOSIS — I82401 Acute embolism and thrombosis of unspecified deep veins of right lower extremity: Secondary | ICD-10-CM

## 2016-08-17 LAB — COAGUCHEK XS/INR WAIVED
INR: 1.9 — AB (ref 0.9–1.1)
Prothrombin Time: 22.9 s

## 2016-08-17 MED ORDER — VALSARTAN 320 MG PO TABS: 320.0000 mg | ORAL_TABLET | Freq: Every day | ORAL | 0 refills | Status: DC

## 2016-08-17 NOTE — Progress Notes (Signed)
Subjective:     Indication: DVT Bleeding signs/symptoms: None Thromboembolic signs/symptoms: None  Missed Coumadin doses: ran out of warfarin Tuesday and missed 1 dose Medication changes: no Dietary changes: no Bacterial/viral infection: no Other concerns: yes - patient reports that her BP is higher in pm (140's to 150's / 90's).  She feels like BP was better controlled when she was taking valsartan 320mg  qd.  It was changed to losartan 100mg  qd due to cost about 2 years ago.    The following portions of the patient's history were reviewed and updated as appropriate: allergies, current medications and problem list.   Objective:    INR Today: 1.9 Current dose: warfarin  5mg  - 1 and 1/2 tablets on mondays and fridays.  Take 1 tablet all other days.  BP was 140/72 in office today - last visit 08/02/16 BP was 142/77 Assessment:    Subtherapeutic INR for goal of 2-3   HTN  Plan:    1. New dose: take extra 1/2 tablet today then resume usual warfarin dose   2. Next INR: 1 month   3.  Change back to valsartan 320mg  daily - stop losartan  Patient ID: Renee Pratt, female   DOB: October 13, 1945, 71 y.o.   MRN: [id number]

## 2016-08-22 ENCOUNTER — Telehealth: Payer: Self-pay

## 2016-08-22 DIAGNOSIS — I10 Essential (primary) hypertension: Secondary | ICD-10-CM

## 2016-08-22 MED ORDER — AMLODIPINE BESYLATE 5 MG PO TABS: 5.0000 mg | ORAL_TABLET | Freq: Every day | ORAL | 3 refills | Status: DC

## 2016-08-22 NOTE — Telephone Encounter (Signed)
Switch to amlodipine 5mg , I sent that in. Can she check her BPs at home? Let me know if higher than [id number] systolic, 90 diastolic regularly. May need to change the dose.

## 2016-08-22 NOTE — Telephone Encounter (Signed)
Aware of new medication.  She understands will  need to check blood pressure and report back to provider if numbers are up to high.Marland Kitchen

## 2016-08-24 ENCOUNTER — Encounter (HOSPITAL_COMMUNITY): Payer: Self-pay | Admitting: Psychiatry

## 2016-08-24 ENCOUNTER — Ambulatory Visit (INDEPENDENT_AMBULATORY_CARE_PROVIDER_SITE_OTHER): Payer: Medicare Other | Admitting: Psychiatry

## 2016-08-24 DIAGNOSIS — F329 Major depressive disorder, single episode, unspecified: Secondary | ICD-10-CM

## 2016-08-24 DIAGNOSIS — F32A Depression, unspecified: Secondary | ICD-10-CM

## 2016-08-24 NOTE — Progress Notes (Signed)
Patient:  Renee Pratt   DOB: [birth year]  MR Number: [id number]  Location: [place]:  [address] [place],  [place], [address]  Start: Thursday  08/24/2016 1:16 PM End: Thursday  08/24/2016  2:10 PM      Provider/Observer:     Maurice Small, MSW, LCSW   Chief Complaint:        No chief complaint on file.   Reason For Service:  Patient is a 71 year old female who presents with symptoms of depression and anxiety that have been present about 2 years. She retired 4 years ago and didn't have a plan about what to do. She moved from [place] to [place], [place]. Most of her family lives in [place] and she has a son who resides in [place]. She misses family and has limited social involvement here. She states being lonely, feeling down, having a tendency to worry, and feeling restless. Patient has had no psychiatric hospitalizations. She reports being seen once in this practice for psychotherapy . Current symptoms include depressed mood, anxiety, poor concentration, and irritability.      Interventions Strategy:  Supportive  Participation Level:   Active  Participation Quality:  Appropriate      Behavioral Observation:  Casual, Alert, and Appropriate.   Current Psychosocial Factors: Living far away from family, living situation, interpersonal conflict  Content of Session:     facilitated expression of feelings, praised and reinforced patient's efforts to pursue volunteer opportunities, assisted patient identify other avenues to increase social involvement, assisted patient identify her core values and ways to incorporate in her decision making   Current Status:   Less depressed mood,  continued anxiety, feelings of loneliness  Patient Progress:   Fair. Patient reports researching another volunteer opportunity and completing application process. Her name is on the waiting list to fill a volunteer position at the [facility] in [place]. She continues to attend congregational  meetings but still tends do most activities alone. She is planning to visit her family in [place] during May and hopes this will help her determine if she wants to return their to live.   Target Goals:   1. Learn and implement behavioral strategies to overcome depression.    2. Learn and implement problem-solving and decision-making skills  Last Reviewed:   06/22/2016  Goals Addressed Today:    1,2  Impression/Diagnosis:   Patient presents with symptoms of depression and anxiety that have been present for about 2 years. Symptoms appear to have been precipitated by her move which is a great distance from family and lifetime friends and loneliness. Current symptoms include depressed mood, anxiety, poor concentration, irritability.   Diagnosis:  Axis I:  Depressive Disorder          Axis II: Deferred    Rosaura Bolon, LCSW 08/24/2016

## 2016-08-25 NOTE — Telephone Encounter (Signed)
x

## 2016-08-31 ENCOUNTER — Ambulatory Visit (HOSPITAL_COMMUNITY): Payer: Self-pay | Admitting: Psychiatry

## 2016-09-13 ENCOUNTER — Other Ambulatory Visit: Payer: Self-pay | Admitting: Pediatrics

## 2016-09-15 ENCOUNTER — Other Ambulatory Visit: Payer: Self-pay | Admitting: Pediatrics

## 2016-09-15 DIAGNOSIS — Z7901 Long term (current) use of anticoagulants: Secondary | ICD-10-CM

## 2016-09-18 ENCOUNTER — Encounter: Payer: Self-pay | Admitting: Pharmacist

## 2016-09-21 ENCOUNTER — Ambulatory Visit (INDEPENDENT_AMBULATORY_CARE_PROVIDER_SITE_OTHER): Payer: Medicare Other | Admitting: Psychiatry

## 2016-09-21 DIAGNOSIS — F329 Major depressive disorder, single episode, unspecified: Secondary | ICD-10-CM | POA: Diagnosis not present

## 2016-09-21 DIAGNOSIS — F32A Depression, unspecified: Secondary | ICD-10-CM

## 2016-09-21 NOTE — Progress Notes (Signed)
Patient:  Renee Pratt   DOB: 1945/10/20  MR Number: [id number]  Location: [place]:  [address] [place],  [place], [address]  Start: Thursday  09/21/2016 1:04 PM End: Thursday  09/21/2016 1:55 PM      Provider/Observer:     Maurice Small, MSW, LCSW   Chief Complaint:        Chief Complaint  Patient presents with  . Depression    Reason For Service:  Patient is a 71 year old female who presents with symptoms of depression and anxiety that have been present about 2 years. She retired 4 years ago and didn't have a plan about what to do. She moved from [place] to [place], [place]. Most of her family lives in [place] and she has a son who resides in [place]. She misses family and has limited social involvement here. She states being lonely, feeling down, having a tendency to worry, and feeling restless. Patient has had no psychiatric hospitalizations. She reports being seen once in this practice for psychotherapy . Current symptoms include depressed mood, anxiety, poor concentration, and irritability.      Interventions Strategy:  Supportive  Participation Level:   Active  Participation Quality:  Appropriate      Behavioral Observation:  Casual, Alert, and Appropriate.   Current Psychosocial Factors: Living far away from family, living situation, interpersonal conflict  Content of Session:     facilitated expression of feelings, praised and reinforced patient's efforts to increase social involvement, discussed effects on mood and behavior, continued to assist patient identify her core values and ways to incorporate in her decision making regarding possibly moving.  Current Status:   Improved mood, decreased anxiety, decreased feelings of loneliness  Patient Progress:   Good. Patient reports experiencing much improved mood and no longer experiencing feelings of loneliness. She has decreased involvement with some people as the relationship was draining and somewhat  negative. She has increased involvement with two of her friends with whom she has had very positive experiences. She has participated in various activities with them and their families. She reports feeling much better since doing this. She continues to contemplate returning to [place] to live as most of her family resides there.    Target Goals:   1. Learn and implement behavioral strategies to overcome depression.    2. Learn and implement problem-solving and decision-making skills  Last Reviewed:   06/22/2016  Goals Addressed Today:    1,2  Impression/Diagnosis:   Patient presents with symptoms of depression and anxiety that have been present for about 2 years. Symptoms appear to have been precipitated by her move which is a great distance from family and lifetime friends and loneliness. Current symptoms include depressed mood, anxiety, poor concentration, irritability.   Diagnosis:  Axis I:  Depressive Disorder          Axis II: Deferred    Renee Stager, LCSW 09/21/2016

## 2016-09-25 ENCOUNTER — Ambulatory Visit (INDEPENDENT_AMBULATORY_CARE_PROVIDER_SITE_OTHER): Payer: Medicare Other | Admitting: Pharmacist

## 2016-09-25 DIAGNOSIS — Z86718 Personal history of other venous thrombosis and embolism: Secondary | ICD-10-CM

## 2016-09-25 DIAGNOSIS — Z79899 Other long term (current) drug therapy: Secondary | ICD-10-CM

## 2016-09-25 DIAGNOSIS — D689 Coagulation defect, unspecified: Secondary | ICD-10-CM | POA: Diagnosis not present

## 2016-09-25 LAB — COAGUCHEK XS/INR WAIVED
INR: 2.5 — ABNORMAL HIGH (ref 0.9–1.1)
Prothrombin Time: 29.6 s

## 2016-09-25 MED ORDER — AMLODIPINE BESYLATE 10 MG PO TABS: 10.0000 mg | ORAL_TABLET | Freq: Every day | ORAL | 0 refills | Status: DC

## 2016-09-25 NOTE — Progress Notes (Signed)
Subjective:     Indication: DVT Bleeding signs/symptoms: None Thromboembolic signs/symptoms: None  Missed Coumadin doses: None Medication changes: yes - procardia changed to amlodipine 5mg  qd.  Patient states that she felt that heart rate was up and BP was up so she has been taking 10mg  of amlodipine for the last 3 days with good results Dietary changes: no Bacterial/viral infection: no Other concerns: no  The following portions of the patient's history were reviewed and updated as appropriate: allergies, current medications and problem list.   Objective:    INR Today: 2.5 Current dose: warfarin 5mg  tablets - take 1 and 1/2 tablets on Mondays and Fridays.  Take 1 tablet all other days.     Vitals:   09/25/16 1003  BP: 138/76  Pulse: 78     Assessment:    Therapeutic INR for goal of 2-3   HTN - controlled  Plan:    1. New dose: no change   2. Next INR: 1 month   3.  Continue current amlodipine dose of 10mg  daily - rx sent in   Patient ID: Renee Pratt, female   DOB: 1945/12/01, 71 y.o.   MRN: [id number]

## 2016-10-04 ENCOUNTER — Telehealth (HOSPITAL_COMMUNITY): Payer: Self-pay | Admitting: *Deleted

## 2016-10-04 NOTE — Telephone Encounter (Signed)
RETURNED PHONE CALL.  APPOINTMENT FOR 10/05/16 CANCELLED AS PATIENT REQUESTED.   WILL WAIT FOR HER TO CALL TO RESCHEDULE.

## 2016-10-05 ENCOUNTER — Ambulatory Visit (HOSPITAL_COMMUNITY): Payer: Self-pay | Admitting: Psychiatry

## 2016-10-26 ENCOUNTER — Encounter (HOSPITAL_COMMUNITY): Payer: Self-pay | Admitting: Psychiatry

## 2016-10-26 ENCOUNTER — Ambulatory Visit (INDEPENDENT_AMBULATORY_CARE_PROVIDER_SITE_OTHER): Payer: Medicare Other | Admitting: Psychiatry

## 2016-10-26 DIAGNOSIS — F329 Major depressive disorder, single episode, unspecified: Secondary | ICD-10-CM | POA: Diagnosis not present

## 2016-10-26 DIAGNOSIS — F32A Depression, unspecified: Secondary | ICD-10-CM

## 2016-10-26 NOTE — Progress Notes (Signed)
Patient:  Renee Pratt   DOB: 1946/05/31  MR Number: [id number]  Location: [place]:  [address] [place],  [place], [address]  Start: Thursday  10/26/2016 1:10 PM End: Thursday  10/26/2016 1:55 PM            Provider/Observer:     Maurice Small, MSW, LCSW   Chief Complaint:        Chief Complaint  Patient presents with  . Depression    Reason For Service:  Patient is a 71 year old female who presents with symptoms of depression and anxiety that have been present about 2 years. She retired 4 years ago and didn't have a plan about what to do. She moved from [place] to [place], [place]. Most of her family lives in [place] and she has a son who resides in [place]. She misses family and has limited social involvement here. She states being lonely, feeling down, having a tendency to worry, and feeling restless. Patient has had no psychiatric hospitalizations. She reports being seen once in this practice for psychotherapy . Current symptoms include depressed mood, anxiety, poor concentration, and irritability.      Interventions Strategy:  Supportive  Participation Level:   Active  Participation Quality:  Appropriate      Behavioral Observation:  Casual, Alert, and Appropriate.   Current Psychosocial Factors: Living far away from family, living situation, interpersonal conflict  Content of Session:     Reviewed symptoms, facilitated expression of feelings and thoughts regarding possibly moving, praised and reinforced patient's increased physical activity and  social involvement, discussed effects on mood and behavior, introduced and explained rationale for using designated worry time, discussed strategies to use to restrict the amount of time spent worrying, assisted patient identify coping statements, discussed rationale for and practiced a mindfulness technique using breath awareness  Current Status:   Improved mood,decreased feelings of loneliness, increased worry,    Patient Progress:   Good. Patient reports continuing experiencing  much improved mood and no longer experiencing feelings of loneliness. She has increased physical activity by resuming exercising at the gym, increased social involvement by visiting friends. She is looking forward to going home to [place] to visit family and friends the end of May. She also has an appointment with a housing department there which has triggered constant  ruminating thoughts about making a decision about her living situation. She expresses fear of making a decision as she fears making a mistake.   Target Goals:   1. Learn and implement behavioral strategies to overcome depression.    2. Learn and implement problem-solving and decision-making skills  Last Reviewed:   06/22/2016  Goals Addressed Today:    1,2  Impression/Diagnosis:   Patient presents with symptoms of depression and anxiety that have been present for about 2 years. Symptoms appear to have been precipitated by her move which is a great distance from family and lifetime friends and loneliness. Current symptoms include depressed mood, anxiety, poor concentration, irritability.   Diagnosis:  Axis I:  Depressive Disorder          Axis II: Deferred    Doyel Mulkern, LCSW 10/26/2016

## 2016-11-02 ENCOUNTER — Ambulatory Visit: Payer: Self-pay | Admitting: Pharmacist

## 2016-11-15 ENCOUNTER — Other Ambulatory Visit: Payer: Self-pay | Admitting: Pharmacist

## 2016-11-15 ENCOUNTER — Encounter: Payer: Medicare Other | Admitting: Pharmacist

## 2016-11-16 ENCOUNTER — Ambulatory Visit (HOSPITAL_COMMUNITY): Payer: Self-pay | Admitting: Psychiatry

## 2016-11-16 ENCOUNTER — Ambulatory Visit (INDEPENDENT_AMBULATORY_CARE_PROVIDER_SITE_OTHER): Payer: Medicare Other | Admitting: Pharmacist

## 2016-11-16 DIAGNOSIS — Z79899 Other long term (current) drug therapy: Secondary | ICD-10-CM

## 2016-11-16 DIAGNOSIS — Z86718 Personal history of other venous thrombosis and embolism: Secondary | ICD-10-CM | POA: Diagnosis not present

## 2016-11-16 DIAGNOSIS — D689 Coagulation defect, unspecified: Secondary | ICD-10-CM

## 2016-11-16 LAB — COAGUCHEK XS/INR WAIVED
INR: 1.9 — ABNORMAL HIGH (ref 0.9–1.1)
Prothrombin Time: 22.3 s

## 2016-12-10 ENCOUNTER — Other Ambulatory Visit: Payer: Self-pay | Admitting: Pediatrics

## 2016-12-14 ENCOUNTER — Ambulatory Visit (HOSPITAL_COMMUNITY): Payer: Self-pay | Admitting: Psychiatry

## 2016-12-21 ENCOUNTER — Ambulatory Visit: Payer: Self-pay | Admitting: Pharmacist

## 2017-01-04 ENCOUNTER — Ambulatory Visit (HOSPITAL_COMMUNITY): Payer: Self-pay | Admitting: Psychiatry

## 2017-01-12 ENCOUNTER — Other Ambulatory Visit: Payer: Self-pay | Admitting: Pediatrics

## 2017-01-15 ENCOUNTER — Other Ambulatory Visit: Payer: Self-pay | Admitting: Pediatrics

## 2017-01-15 ENCOUNTER — Encounter: Payer: Self-pay | Admitting: Pharmacist

## 2017-01-15 ENCOUNTER — Ambulatory Visit (INDEPENDENT_AMBULATORY_CARE_PROVIDER_SITE_OTHER): Payer: Medicare Other | Admitting: Pharmacist

## 2017-01-15 VITALS — BP 138/74 | HR 76 | Ht 66.5 in | Wt 211.0 lb

## 2017-01-15 DIAGNOSIS — Z0000 Encounter for general adult medical examination without abnormal findings: Secondary | ICD-10-CM

## 2017-01-15 DIAGNOSIS — E7800 Pure hypercholesterolemia, unspecified: Secondary | ICD-10-CM

## 2017-01-15 DIAGNOSIS — D689 Coagulation defect, unspecified: Secondary | ICD-10-CM

## 2017-01-15 DIAGNOSIS — Z79899 Other long term (current) drug therapy: Secondary | ICD-10-CM

## 2017-01-15 DIAGNOSIS — Z86718 Personal history of other venous thrombosis and embolism: Secondary | ICD-10-CM

## 2017-01-15 DIAGNOSIS — Z1231 Encounter for screening mammogram for malignant neoplasm of breast: Secondary | ICD-10-CM

## 2017-01-15 LAB — COAGUCHEK XS/INR WAIVED
INR: 2.3 — AB (ref 0.9–1.1)
PROTHROMBIN TIME: 27.3 s

## 2017-01-15 MED ORDER — AMLODIPINE BESYLATE 10 MG PO TABS: 5.0000 mg | ORAL_TABLET | Freq: Every day | ORAL | 0 refills | Status: DC

## 2017-01-15 NOTE — Patient Instructions (Addendum)
  Ms. Croghan , Thank you for taking time to come for your Medicare Wellness Visit. I appreciate your ongoing commitment to your health goals. Please review the following plan we discussed and let me know if I can assist you in the future.   These are the goals we discussed:  Decrease amlodipine to 5mg  daily to see if nausea and edema improve  Restart going to gym at [facility] or in your new apartment building.  Recommend exercise at least 150 minutes each week.   Goal BMI (body mass index) is less than 30.   Increase non-starchy vegetables - carrots, green bean, squash, zucchini, tomatoes, onions, peppers, spinach and other green leafy vegetables, cabbage, lettuce, cucumbers, asparagus, okra (not fried), eggplant Limit sugar and processed foods (cakes, cookies, ice cream, crackers and chips) Increase fresh fruit but limit serving sizes 1/2 cup or about the size of tennis or baseball Limit red meat to no more than 1-2 times per week (serving size about the size of your palm) Choose whole grains / lean proteins - whole wheat bread, quinoa, whole grain rice (1/2 cup), fish, chicken, [place] Avoid sugar and calorie containing beverages - soda, sweet tea and juice.  Choose water or unsweetened tea instead.   This is a list of the screening recommended for you and due dates:  Health Maintenance  Topic Date Due  . Tetanus Vaccine  07/10/2012  . Mammogram  10/07/2016  . Flu Shot  02/07/2017  . DEXA scan (bone density measurement)  12/18/2017  . Colon Cancer Screening  11/18/2020  .  Hepatitis C: One time screening is recommended by Center for Disease Control  (CDC) for  adults born from [birth year] through 1963.   Completed  . Pneumonia vaccines  Completed

## 2017-01-15 NOTE — Progress Notes (Signed)
Patient ID: Renee Pratt, female   DOB: 07-12-1945, 71 y.o.   MRN: [id number]     Subjective:   Renee Pratt is a 71 y.o. female who presents for a subsequent Medicare Annual Wellness Visit and to recheck INR.  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  She is currently taking warfarin 413m tablets - 7.567mmondays and fridays and 13m19mll other days.  She denies andy s/s of bleeding.  Mrs. Vayda reports that she feels that over all her health is better compared to last year.   Today she reports the following health concerns:  She is experiencing some nausea over that last month or 2 and feels that it might be related to increase in amlodipine from 13mg74m to 10mg7m   She also reports that she has been having more edema even tough she is wearing her compression stockings regularly She also reports that she has stopped taking atorvastatin because she experienced myalgias.   Social History: Mrs. Kirk Andys alone in [place] apartment [place] reports today that she will be moving to a Senior apartment in 2 days that she is very excited about this because she believes that she will be safer and they will offer better services. She is divorced since 1984.7e 3 grown sons.  Most of [place] family [place] in [place] does have friends and community members for support.    Current Medications (verified) Outpatient Encounter Prescriptions as of 01/15/2017  Medication Sig  . amLODipine (NORVASC) 10 MG tablet Take 1 tablet (10 mg total) by mouth daily.  . cholecalciferol (VITAMIN D) 1000 UNITS tablet Take 2,000 Units by mouth daily.  . docMarland Kitchensate sodium (COLACE) 100 MG capsule Take 100 mg by mouth daily as needed for mild constipation.  . hydrochlorothiazide (HYDRODIURIL) 25 MG tablet TAKE 1 TABLET BY MOUTH ONCE DAILY  . valsartan (DIOVAN) 320 MG tablet TAKE 1 TABLET BY MOUTH ONCE DAILY  . warfarin (COUMADIN) 5 MG tablet TAKE ONE TO ONE AND ONE-HALF TABLETS BY MOUTH ONCE DAILY AS  DIRECTED  . Multiple Vitamins-Minerals (CENTRUM SILVER PO) Take 1 tablet by mouth every other day.  . polyethylene glycol powder (GLYCOLAX/MIRALAX) powder Take 17 g by mouth daily. (Patient not taking: Reported on 01/15/2017)  . [DISCONTINUED] atorvastatin (LIPITOR) 40 MG tablet Take 1 tablet (40 mg total) by mouth daily. (Patient not taking: Reported on 11/16/2016)   No facility-administered encounter medications on file as of 01/15/2017.     Allergies (verified) Atorvastatin; Crestor [rosuvastatin calcium]; and Tetracyclines & related   History: Past Medical History:  Diagnosis Date  . Blood clotting tendency (HCC) [place] Cataract   . DDD (degenerative disc disease), lumbar   . Depression   . 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 First time 2011, another in 2012, 03/2013  . GERD (gastroesophageal reflux disease)    hiatal hernia  . 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 Hiatal hernia   . Hyperlipidemia   . Hypertension   . Uterine fibroid   . Vaginal Pap smear, abnormal    Past Surgical History:  Procedure Laterality Date  . CERVICAL BIOPSY    . COLPOSCOPY    . INSERTION OF VENA CAVA FILTER  01-17-2011   dr pindr # [phone] [place]  Problem Relation Age of  Onset  . Kidney disease Mother   . Hypertension Mother   . Stroke Mother 7  . AAA (abdominal aortic aneurysm) Mother   . Cancer Father 36       lung cancer  . Hypertension Father   . Depression Maternal Grandmother   . Depression Son   . Other Cousin        died fo pulmonary embolus  . Colon cancer Cousin   . Clotting disorder Neg Hx   . Colon polyps Neg Hx    Social History   Occupational History  . Not on file.   Social History Main Topics  . Smoking status: Former Smoker    Packs/day: 0.25    Years: 20.00    Types: Cigarettes    Quit date: 10/17/1997  . Smokeless tobacco: Never Used     Comment: Quit x  25 years  . Alcohol use 0.0 oz/week     Comment: Socially  . Drug use: No  . Sexual activity: No    Do you feel safe at home?  No - patient dones not feel safe in her current apartment.  She won't take shower at night for fear of someone breaking in.  She is moving to a new apartment in 2 days.  Are there smokers in your home (other than you)? No  Dietary issues and exercise activities: Current Exercise Habits: The patient does not participate in regular exercise at present  Current Dietary habits:  Eats out quite a bit - [employer] or [employer]  She does keep serving sizes small but little vegetable intake except side salads   Objective:    Today's Vitals   01/15/17 1425  BP: 138/74  Pulse: 76  Weight: 211 lb (95.7 kg)  Height: 5' 6.5" (1.689 m)  PainSc: 0-No pain   Body mass index is 33.55 kg/m.  Activities of Daily Living In your present state of health, do you have any difficulty performing the following activities: 01/15/2017  Hearing? N  Vision? N  Difficulty concentrating or making decisions? N  Walking or climbing stairs? N  Dressing or bathing? N  Doing errands, shopping? N  Preparing Food and eating ? N  Using the Toilet? N  In the past six months, have you accidently leaked urine? N  Do you have problems with loss of bowel control? N  Managing your Medications? N  Managing your Finances? N  Housekeeping or managing your Housekeeping? N  Some recent data might be hidden     Cardiac Risk Factors include: advanced age (>9mn, >>46women);dyslipidemia;family history of premature cardiovascular disease;hypertension;obesity (BMI >30kg/m2);sedentary lifestyle  Depression Screen PHQ 2/9 Scores 01/15/2017 06/14/2016 02/24/2016 09/23/2015  PHQ - 2 Score 0 0 0 0  PHQ- 9 Score - - - -     Fall Risk Fall Risk  01/15/2017 06/14/2016 02/24/2016 09/23/2015 07/29/2015  Falls in the past year? No No No Yes No  Number falls in past yr: - - - 1 -  Injury with Fall? - - - No -    Follow up - - - Falls prevention discussed -    Cognitive Function: MMSE - Mini Mental State Exam 01/15/2017 09/23/2015  Orientation to time 5 5  Orientation to Place 5 5  Registration 3 3  Attention/ Calculation 5 3  Recall 3 3  Language- name 2 objects 2 2  Language- repeat 1 1  Language- follow 3 step command 3 3  Language- read & follow direction 1 1  Write  a sentence 1 1  Copy design 0 0  Total score 29 27    Immunizations and Health Maintenance Immunization History  Administered Date(s) Administered  . Influenza, High Dose Seasonal PF 04/06/2016  . Influenza,inj,Quad PF,36+ Mos 05/08/2013, 05/20/2015  . Pneumococcal Conjugate-13 12/08/2014  . Pneumococcal Polysaccharide-23 09/11/2012  . Tdap 07/10/2002  . Zoster 07/11/2007   Health Maintenance Due  Topic Date Due  . TETANUS/TDAP  07/10/2012  . MAMMOGRAM  10/07/2016    Patient Care Team: Eustaquio Maize, MD as PCP - General (Pediatrics) Cathe Mons, MD (Gastroenterology) Estill Dooms, NP as Nurse Practitioner (Obstetrics and Gynecology)  Indicate any recent Medical Services you may have received from other than [facility] providers in the past year (date may be approximate).    Assessment:    Annual Wellness Visit  Therapeutic anticoagulation Edema / nausea - possible side effect from increased amlodipine.   Screening Tests Health Maintenance  Topic Date Due  . TETANUS/TDAP  07/10/2012  . MAMMOGRAM  10/07/2016  . INFLUENZA VACCINE  02/07/2017  . DEXA SCAN  12/18/2017  . COLONOSCOPY  11/18/2020  . Hepatitis C Screening  Completed  . PNA vac Low Risk Adult  Completed        Plan:   During the course of the visit Wan[place] was educated and counseled about the following appropriate screening and preventive services:   Vaccines to include Pneumoccal, Influenza, Td and Shingles - checked cost of Boostric and Shingrix - each was $45.  Patient to consider.   Colorectal cancer  screening  Cardiovascular disease screening  Decrease amlodipine to 56m qd - RTC in 1 month to check BP  Diabetes screening  Bone Denisty / Osteoporosis Screening  Mammogram - abnormal mammogram 2017 - repeat mammogram set up today by our referral office while patient was in office today  PAP  Glaucoma screening / eye Exam  Nutrition counseling - increase fruits and vegetables  Advanced Directives  Physical Activity = recommended rejoin Silver Sneakers program - goal 150 minutes at least per week  Orders Placed This Encounter  Procedures  . CMP14+EGFR  . Lipid panel  . LDL Cholesterol, Direct  . CBC with Differential/Platelet   Anticoagulation Warfarin Dose Instructions as of 01/15/2017      SDorene GrebeTue Wed Thu Fri Sat   New Dose 5 mg 7.5 mg 5 mg 5 mg 5 mg 7.5 mg 5 mg    Description   Continue current warfarin 516mdose - take 1 and 1/2 on Mondays and Fridays. Take 1 tablet all other days.  INR today is 2.3 today        Goals    . Weight (lb) < 190 lb (86.2 kg)        Patient Instructions (the written plan) were given to the patient.   TaCherre RobinsPharmD   01/15/2017

## 2017-01-16 ENCOUNTER — Other Ambulatory Visit: Payer: Self-pay | Admitting: Pharmacist

## 2017-01-16 LAB — CMP14+EGFR
ALBUMIN: 4.2 g/dL (ref 3.6–4.8)
ALT: 10 IU/L (ref 0–32)
AST: 14 IU/L (ref 0–40)
Albumin/Globulin Ratio: 1.6 (ref 1.2–2.2)
Alkaline Phosphatase: 73 IU/L (ref 39–117)
BUN / CREAT RATIO: 16 (ref 12–28)
BUN: 12 mg/dL (ref 8–27)
Bilirubin Total: 0.2 mg/dL (ref 0.0–1.2)
CALCIUM: 9.2 mg/dL (ref 8.7–10.3)
CO2: 26 mmol/L (ref 20–29)
CREATININE: 0.77 mg/dL (ref 0.57–1.00)
Chloride: 101 mmol/L (ref 96–106)
GFR calc Af Amer: 91 mL/min/{1.73_m2} (ref >59)
GFR, EST NON AFRICAN AMERICAN: 79 mL/min/{1.73_m2} (ref >59)
GLOBULIN, TOTAL: 2.6 g/dL (ref 1.5–4.5)
GLUCOSE: 94 mg/dL (ref 65–99)
Potassium: 3.5 mmol/L (ref 3.5–5.2)
SODIUM: 142 mmol/L (ref 134–144)
Total Protein: 6.8 g/dL (ref 6.0–8.5)

## 2017-01-16 LAB — CBC WITH DIFFERENTIAL/PLATELET
Basophils Absolute: 0 10*3/uL (ref 0.0–0.2)
Basos: 1 %
EOS (ABSOLUTE): 0.1 10*3/uL (ref 0.0–0.4)
EOS: 3 %
Hematocrit: 36.4 % (ref 34.0–46.6)
Hemoglobin: 11.8 g/dL (ref 11.1–15.9)
Immature Grans (Abs): 0 10*3/uL (ref 0.0–0.1)
Immature Granulocytes: 0 %
LYMPHS ABS: 1.8 10*3/uL (ref 0.7–3.1)
Lymphs: 42 %
MCH: 28.9 pg (ref 26.6–33.0)
MCHC: 32.4 g/dL (ref 31.5–35.7)
MCV: 89 fL (ref 79–97)
MONOCYTES: 8 %
MONOS ABS: 0.3 10*3/uL (ref 0.1–0.9)
NEUTROS ABS: 2 10*3/uL (ref 1.4–7.0)
Neutrophils: 46 %
Platelets: 187 10*3/uL (ref 150–379)
RBC: 4.09 x10E6/uL (ref 3.77–5.28)
RDW: 15.7 % — AB (ref 12.3–15.4)
WBC: 4.2 10*3/uL (ref 3.4–10.8)

## 2017-01-16 LAB — LIPID PANEL
Chol/HDL Ratio: 6 ratio — ABNORMAL HIGH (ref 0.0–4.4)
Cholesterol, Total: 290 mg/dL — ABNORMAL HIGH (ref 100–199)
HDL: 48 mg/dL
LDL Calculated: 208 mg/dL — ABNORMAL HIGH (ref 0–99)
Triglycerides: 169 mg/dL — ABNORMAL HIGH (ref 0–149)
VLDL Cholesterol Cal: 34 mg/dL (ref 5–40)

## 2017-01-16 LAB — LDL CHOLESTEROL, DIRECT: LDL Direct: 226 mg/dL — ABNORMAL HIGH (ref 0–99)

## 2017-01-16 MED ORDER — PRAVASTATIN SODIUM 40 MG PO TABS: 40.0000 mg | ORAL_TABLET | Freq: Every evening | ORAL | 0 refills | Status: DC

## 2017-02-06 ENCOUNTER — Telehealth: Payer: Self-pay | Admitting: Pediatrics

## 2017-02-06 ENCOUNTER — Other Ambulatory Visit: Payer: Self-pay | Admitting: *Deleted

## 2017-02-06 MED ORDER — PRAVASTATIN SODIUM 40 MG PO TABS: 40.0000 mg | ORAL_TABLET | Freq: Every evening | ORAL | 0 refills | Status: DC

## 2017-02-06 NOTE — Progress Notes (Signed)
RX sent to [facility] per pt request

## 2017-02-06 NOTE — Telephone Encounter (Signed)
Pt is having heart palpitations Wants to change BP med appt scheduled for evaluation

## 2017-02-08 ENCOUNTER — Ambulatory Visit (HOSPITAL_COMMUNITY): Payer: Medicare Other | Admitting: Psychiatry

## 2017-02-08 ENCOUNTER — Encounter: Payer: Self-pay | Admitting: Pediatrics

## 2017-02-08 ENCOUNTER — Ambulatory Visit (INDEPENDENT_AMBULATORY_CARE_PROVIDER_SITE_OTHER): Payer: Medicare Other | Admitting: Pediatrics

## 2017-02-08 VITALS — BP 135/79 | HR 82 | Temp 98.0°F | Ht 66.5 in | Wt 210.0 lb

## 2017-02-08 DIAGNOSIS — R002 Palpitations: Secondary | ICD-10-CM | POA: Diagnosis not present

## 2017-02-08 DIAGNOSIS — I10 Essential (primary) hypertension: Secondary | ICD-10-CM

## 2017-02-08 MED ORDER — OLMESARTAN MEDOXOMIL 40 MG PO TABS: 40.0000 mg | ORAL_TABLET | Freq: Every day | ORAL | 3 refills | Status: DC

## 2017-02-08 NOTE — Progress Notes (Signed)
  Subjective:   Patient ID: Renee Pratt, female    DOB: December 15, 1945, 71 y.o.   MRN: [id number] CC: Palpitations  HPI: Renee Pratt is a 71 y.o. female presenting for Palpitations  Says she is having palpitations several times a day Says it is a similar feeling to what she has had before, different than the "shivering inside" she was seen for last fall Now feels like her heart is "in her throat", has extra beats at times Happens several times in a day, more often than what was happening in the past No lightheadedness, dizziness, syncope or falls Feeling well otherwise No HA or CP or SOB  HTN: taking meds regularly, BP at home about what it is here Valsartan was recalled, asking for alternate med  H/o DVT: on coumadin, has appt next week for INR  Relevant past medical, surgical, family and social history reviewed. Allergies and medications reviewed and updated. History  Smoking Status  . Former Smoker  . Packs/day: 0.25  . Years: 20.00  . Types: Cigarettes  . Quit date: 10/17/1997  Smokeless Tobacco  . Never Used    Comment: Quit x 25 years   ROS: Per HPI   Objective:    BP 135/79   Pulse 82   Temp 98 F (36.7 C) (Oral)   Ht 5' 6.5" (1.689 m)   Wt 210 lb (95.3 kg)   BMI 33.39 kg/m   Wt Readings from Last 3 Encounters:  02/08/17 210 lb (95.3 kg)  01/15/17 211 lb (95.7 kg)  08/02/16 209 lb (94.8 kg)    Gen: NAD, alert, cooperative with exam, NCAT EYES: EOMI, no conjunctival injection, or no icterus ENT:  OP without erythema LYMPH: no cervical LAD CV: NRRR, normal S1/S2, no murmur, distal pulses 2+ b/l Resp: CTABL, no wheezes, normal WOB Abd: +BS, soft, NTND.  Ext: No edema, warm Neuro: Alert and oriented, strength equal b/l UE and LE, coordination grossly normal  EKG: NSR, no Twave inversions, similar to prior  Assessment & Plan:  Renee Pratt was seen today for palpitations.  Diagnoses and all orders for this visit:  Essential hypertension Adequate  control, valsartan unfortunately has been recalled, stop valsartan, start below Follow BPs at home -     olmesartan (BENICAR) 40 MG tablet; Take 1 tablet (40 mg total) by mouth daily. -     EKG 12-Lead  Palpitations EKG unchanged, will place 24h holter monitor today -     EKG 12-Lead  Follow up plan: Next week for INR check [name], MD [place]

## 2017-02-09 ENCOUNTER — Other Ambulatory Visit: Payer: Self-pay | Admitting: *Deleted

## 2017-02-09 DIAGNOSIS — R002 Palpitations: Secondary | ICD-10-CM

## 2017-02-11 ENCOUNTER — Other Ambulatory Visit: Payer: Self-pay | Admitting: Pediatrics

## 2017-02-12 ENCOUNTER — Other Ambulatory Visit: Payer: Self-pay | Admitting: Pediatrics

## 2017-02-12 DIAGNOSIS — I10 Essential (primary) hypertension: Secondary | ICD-10-CM

## 2017-02-12 MED ORDER — OLMESARTAN MEDOXOMIL 40 MG PO TABS: 40.0000 mg | ORAL_TABLET | Freq: Every day | ORAL | 3 refills | Status: DC

## 2017-02-12 NOTE — Telephone Encounter (Signed)
Done

## 2017-02-13 ENCOUNTER — Ambulatory Visit (INDEPENDENT_AMBULATORY_CARE_PROVIDER_SITE_OTHER): Payer: Medicare Other | Admitting: Psychiatry

## 2017-02-13 ENCOUNTER — Encounter (HOSPITAL_COMMUNITY): Payer: Self-pay | Admitting: Psychiatry

## 2017-02-13 DIAGNOSIS — F329 Major depressive disorder, single episode, unspecified: Secondary | ICD-10-CM

## 2017-02-13 DIAGNOSIS — F32A Depression, unspecified: Secondary | ICD-10-CM

## 2017-02-13 NOTE — Progress Notes (Signed)
Patient:  Renee Pratt   DOB: 1945/11/08  MR Number: [id number]  Location: [place]:  [address] [place],  [place], [address]  Start: Tuesday 02/13/2017 11:02 AM End: Tuesday 02/13/2017 11:44 AM          Provider/Observer:     Maurice Small, MSW, LCSW   Chief Complaint:        No chief complaint on file.   Reason For Service:  Patient is a 71 year old female who presents with symptoms of depression and anxiety that have been present about 2 years. She retired 4 years ago and didn't have a plan about what to do. She moved from [place] to [place], [place]. Most of her family lives in [place] and she has a son who resides in [place]. She misses family and has limited social involvement here. She states being lonely, feeling down, having a tendency to worry, and feeling restless. Patient has had no psychiatric hospitalizations. She reports being seen once in this practice for psychotherapy . Current symptoms include depressed mood, anxiety, poor concentration, and irritability.      Interventions Strategy:  Supportive  Participation Level:   Active  Participation Quality:  Appropriate      Behavioral Observation:  Casual, Alert, and Appropriate.   Current Psychosocial Factors: Living far away from family, living situation, interpersonal conflict  Content of Session:     Reviewed symptoms, facilitated expression of feelings and thoughts regarding recent transitions, praised and reinforced patient's increased physical activity and  social involvement, discussed effects on mood and behavior, discussed changes in thought patterns and effects on mood and behavior  Current Status:   Improved mood,decreased feelings of loneliness, decreased anxiety, decreased worry  Suicidal/Homicidal:   No  Patient Progress:   Good. Patient last was seen 4 months ago. She reports moving into senior housing in [place] on July 11. She is very pleased with her new home and says her outlook  on life has completely changed. She reports much more positive thinking and feeling safe and comfortable in her new home. She reports her past environment had much more of a negative affect on her then she realized. She still contemplates moving to [place] to be near her son in the future as she ages and possible health concerns, discussed stepdown plan for possible termination  Target Goals:   1. Learn and implement behavioral strategies to overcome depression.    2. Learn and implement problem-solving and decision-making skills  Last Reviewed:   06/22/2016  Goals Addressed Today:    1,2  Plan:     Return in 1 month.  Impression/Diagnosis:   Patient presents with symptoms of depression and anxiety that have been present for about 2 years. Symptoms appear to have been precipitated by her move which is a great distance from family and lifetime friends and loneliness. Current symptoms include depressed mood, anxiety, poor concentration, irritability.   Diagnosis:  Axis I:  Depressive Disorder          Axis II: Deferred    Soren Lazarz, LCSW 02/13/2017

## 2017-02-15 ENCOUNTER — Encounter: Payer: Self-pay | Admitting: Pediatrics

## 2017-02-15 ENCOUNTER — Ambulatory Visit (INDEPENDENT_AMBULATORY_CARE_PROVIDER_SITE_OTHER): Payer: Medicare Other | Admitting: Pediatrics

## 2017-02-15 VITALS — BP 131/82 | HR 80 | Temp 98.8°F | Ht 66.5 in | Wt 210.6 lb

## 2017-02-15 DIAGNOSIS — I493 Ventricular premature depolarization: Secondary | ICD-10-CM | POA: Diagnosis not present

## 2017-02-15 MED ORDER — METOPROLOL TARTRATE 25 MG PO TABS: 12.5000 mg | ORAL_TABLET | Freq: Two times a day (BID) | ORAL | 3 refills | Status: DC

## 2017-02-15 NOTE — Progress Notes (Signed)
  Subjective:   Patient ID: Renee Pratt, female    DOB: [birth year], 71 y.o.   MRN: [id number] CC: Follow-up (3 month) med problems HPI: Renee Pratt is a 71 y.o. female presenting for Follow-up (3 month)  Wore holter monitor for 24h last week Had a few PACs throughout the day Multiple PVCs in almost each hour time block Likely the cause of her symptoms Continues to feel her heart "skipping"  No fevers No SOB Otherwise feeling well  Relevant past medical, surgical, family and social history reviewed. Allergies and medications reviewed and updated. History  Smoking Status  . Former Smoker  . Packs/day: 0.25  . Years: 20.00  . Types: Cigarettes  . Quit date: 10/17/1997  Smokeless Tobacco  . Never Used    Comment: Quit x 25 years   ROS: Per HPI   Objective:    BP 131/82   Pulse 80   Temp 98.8 F (37.1 C) (Oral)   Ht 5' 6.5" (1.689 m)   Wt 210 lb 9.6 oz (95.5 kg)   BMI 33.48 kg/m   Wt Readings from Last 3 Encounters:  02/15/17 210 lb 9.6 oz (95.5 kg)  02/08/17 210 lb (95.3 kg)  01/15/17 211 lb (95.7 kg)    Gen: NAD, alert, cooperative with exam, NCAT EYES: EOMI, no conjunctival injection, or no icterus CV: NRRR, normal S1/S2, no murmur Resp: CTABL, no wheezes, normal WOB Ext: No edema, warm Neuro: Alert and oriented, strength equal b/l UE and LE, coordination grossly normal  Assessment & Plan:  Renee Pratt was seen today for follow-up heart palpitations, found to have PVCs.  Diagnoses and all orders for this visit:  PVC (premature ventricular contraction) Start below If ongoing symptoms let me know -     metoprolol tartrate (LOPRESSOR) 25 MG tablet; Take 0.5 tablets (12.5 mg total) by mouth 2 (two) times daily.   Follow up plan: Return in about 3 months (around 05/18/2017). [name], MD [place]

## 2017-02-16 ENCOUNTER — Other Ambulatory Visit: Payer: Self-pay | Admitting: Pharmacist

## 2017-02-26 ENCOUNTER — Ambulatory Visit
Admission: RE | Admit: 2017-02-26 | Discharge: 2017-02-26 | Disposition: A | Discharge status: Home / Self Care | Payer: Medicare Other | Source: Ambulatory Visit | Attending: Pediatrics | Admitting: Pediatrics

## 2017-02-26 DIAGNOSIS — Z1231 Encounter for screening mammogram for malignant neoplasm of breast: Secondary | ICD-10-CM

## 2017-03-12 ENCOUNTER — Other Ambulatory Visit: Payer: Self-pay | Admitting: Pediatrics

## 2017-03-12 DIAGNOSIS — Z7901 Long term (current) use of anticoagulants: Secondary | ICD-10-CM

## 2017-03-16 ENCOUNTER — Ambulatory Visit (INDEPENDENT_AMBULATORY_CARE_PROVIDER_SITE_OTHER): Payer: Medicare Other | Admitting: Pharmacist Clinician (PhC)/ Clinical Pharmacy Specialist

## 2017-03-16 DIAGNOSIS — Z86718 Personal history of other venous thrombosis and embolism: Secondary | ICD-10-CM

## 2017-03-16 DIAGNOSIS — D689 Coagulation defect, unspecified: Secondary | ICD-10-CM

## 2017-03-16 DIAGNOSIS — Z79899 Other long term (current) drug therapy: Secondary | ICD-10-CM | POA: Diagnosis not present

## 2017-03-16 LAB — COAGUCHEK XS/INR WAIVED
INR: 2.2 — ABNORMAL HIGH (ref 0.9–1.1)
PROTHROMBIN TIME: 26.9 s

## 2017-03-16 NOTE — Patient Instructions (Signed)
Anticoagulation Warfarin Dose Instructions as of 03/16/2017      Dorene Grebe Tue Wed Thu Fri Sat   New Dose 5 mg 7.5 mg 5 mg 5 mg 5 mg 7.5 mg 5 mg    Description   Continue current warfarin 5mg  dose - take 1 and 1/2 on Mondays and Fridays. Take 1 tablet all other days.  INR today is 2.2 today

## 2017-03-27 ENCOUNTER — Encounter (HOSPITAL_COMMUNITY): Payer: Self-pay | Admitting: Psychiatry

## 2017-03-27 ENCOUNTER — Ambulatory Visit (INDEPENDENT_AMBULATORY_CARE_PROVIDER_SITE_OTHER): Payer: Medicare Other | Admitting: Psychiatry

## 2017-03-27 DIAGNOSIS — F329 Major depressive disorder, single episode, unspecified: Secondary | ICD-10-CM

## 2017-03-27 DIAGNOSIS — F32A Depression, unspecified: Secondary | ICD-10-CM

## 2017-03-27 NOTE — Progress Notes (Signed)
Patient:  Renee Pratt   DOB: 1946/05/08  MR Number: [id number]  Location: [facility]:  [address] [place],  [place], [address]  Start: Tuesday 03/27/2017 1:15 PM  End: Tuesday  9/18/20191:59 PM                 Provider/Observer:     Maurice Small, MSW, LCSW   Chief Complaint:        Chief Complaint  Patient presents with  . Depression    Reason For Service:  Patient is a 71 year old female who presents with symptoms of depression and anxiety that have been present about 2 years. She retired 4 years ago and didn't have a plan about what to do. She moved from [place] to [place], [place]. Most of her family lives in [place] and she has a son who resides in [place]. She misses family and has limited social involvement here. She states being lonely, feeling down, having a tendency to worry, and feeling restless. Patient has had no psychiatric hospitalizations. She reports being seen once in this practice for psychotherapy . Current symptoms include depressed mood, anxiety, poor concentration, and irritability.      Interventions Strategy:  Supportive/CBT  Participation Level:   Active  Participation Quality:  Appropriate      Behavioral Observation:  Casual, Alert, and Appropriate.   Current Psychosocial Factors: Living far away from family, living situation, interpersonal conflict  Content of Session:     Reviewed symptoms, facilitated expression of feelings and thoughts regarding relationship with children and the effects on her decision-making ability about possibly moving to [place], assisted patient to begin to identify her thought patterns, began to discuss the connection between thoughts/mood/and behavior, discuss possible plans for continued treatment   Current Status:   Improved mood,decreased feelings of loneliness, decreased anxiety, increased worry  Suicidal/Homicidal:   No  Patient Progress:   Patient last was seen 4 weeks ago. She reports  continuing to like her apartment but no longer being satisfied. She reports still wanting to live near family. Her youngest son wants her to move to [place]. However, her oldest son wants her to stay in [place] as he says he eventually plans to move here. Patient reports worry and anxiety about making a decision and expresses fear she may not see her other 2 children if she moves to [place]. She reports a pattern of negative thinking about this as well as several other issues.  Target Goals:   1. Learn and implement behavioral strategies to overcome depression.    2. Learn and implement problem-solving and decision-making skills  Last Reviewed:   06/22/2016  Goals Addressed Today:    1,2  Plan:     Return in 2 weeks  Impression/Diagnosis:   Patient presents with symptoms of depression and anxiety that have been present for about 2 years. Symptoms appear to have been precipitated by her move which is a great distance from family and lifetime friends and loneliness. Current symptoms include depressed mood, anxiety, poor concentration, irritability.   Diagnosis:  Axis I:  Depressive Disorder          Axis II: Deferred    Kengo Sturges, LCSW 03/27/2017

## 2017-04-03 ENCOUNTER — Telehealth: Payer: Self-pay | Admitting: Pediatrics

## 2017-04-03 NOTE — Telephone Encounter (Signed)
Patient reports since changing her BP meds from Diovan to Amlodipine and Benicar she has been experiencing constipation on a regular basis.  She is taking a stool softener twice a day, using Miralax and also drinking warm prune juice.  She will eventually have a bowel movement but is very uncomfortable until then.  She would like to know if you feel this could be coming from these meds, and if so could she try something else for her BP.  Please advise.

## 2017-04-04 ENCOUNTER — Telehealth: Payer: Self-pay | Admitting: Pediatrics

## 2017-04-04 NOTE — Telephone Encounter (Signed)
Patient states that she is taking miralax once daily, stool softner, and laxatives with relief. Informed patient that she needs to drink plenty of fluids with Miralax.  Patient states that she will try and if does not get any better she will contact us back

## 2017-04-04 NOTE — Telephone Encounter (Signed)
Make sure drinking plenty of fluids, can you call pt back?

## 2017-04-05 NOTE — Telephone Encounter (Signed)
Call returned by nurse

## 2017-04-17 ENCOUNTER — Telehealth: Payer: Self-pay

## 2017-04-17 DIAGNOSIS — K5900 Constipation, unspecified: Secondary | ICD-10-CM

## 2017-04-17 NOTE — Telephone Encounter (Signed)
Wants a referral to cardiology in [facility]

## 2017-04-18 ENCOUNTER — Ambulatory Visit (INDEPENDENT_AMBULATORY_CARE_PROVIDER_SITE_OTHER): Payer: Medicare Other | Admitting: Psychiatry

## 2017-04-18 ENCOUNTER — Encounter (HOSPITAL_COMMUNITY): Payer: Self-pay | Admitting: Psychiatry

## 2017-04-18 DIAGNOSIS — F32A Depression, unspecified: Secondary | ICD-10-CM

## 2017-04-18 DIAGNOSIS — F329 Major depressive disorder, single episode, unspecified: Secondary | ICD-10-CM | POA: Diagnosis not present

## 2017-04-18 MED ORDER — LUBIPROSTONE 8 MCG PO CAPS: 8.0000 ug | ORAL_CAPSULE | Freq: Two times a day (BID) | ORAL | 3 refills | Status: DC

## 2017-04-18 NOTE — Addendum Note (Signed)
Addended by: Eustaquio Maize on: 04/18/2017 07:04 PM   Modules accepted: Orders

## 2017-04-18 NOTE — Telephone Encounter (Signed)
spoke with pt Has had more constipation since being on the metoprolol Continues to have brief palpitations (last a few seconds) a few times a week No worse than before Is on blood thinner for DVT history Will start amitiza for constipation Coming tomorrow for linzess samples because currently feels very bloated linzess causes diarrhea but does help with constipation

## 2017-04-18 NOTE — Progress Notes (Addendum)
Patient:  Renee Pratt   DOB: Sep 28, 1945  MR Number: [id number]  Location: [facility]:  [address] [place],  [place], [address]  Start: Wednesday 04/18/2017 10:55 AM  End: Wednesday 04/18/2017 10:56 AM          Provider/Observer:     Maurice Small, MSW, LCSW   Chief Complaint:        Chief Complaint  Patient presents with  . Depression    Reason For Service:  Patient is a 71 year old female who presents with symptoms of depression and anxiety that have been present about 2 years. She retired 4 years ago and didn't have a plan about what to do. She moved from [place] to [place], [place]. Most of her family lives in [place] and she has a son who resides in [place]. She misses family and has limited social involvement here. She states being lonely, feeling down, having a tendency to worry, and feeling restless. Patient has had no psychiatric hospitalizations. She reports being seen once in this practice for psychotherapy . Current symptoms include depressed mood, anxiety, poor concentration, and irritability.      Interventions Strategy:  Supportive/CBT  Participation Level:   Active  Participation Quality:  Appropriate      Behavioral Observation:  Casual, Alert, and Appropriate.   Current Psychosocial Factors: Living far away from family, living situation, interpersonal conflict  Content of Session:     Reviewed symptoms, facilitated expression of feelings and thoughts regarding relationship with children and the effects on her decision-making ability about possibly moving to [place], Reviewed treatment plan, introduced the cognitive model to patient using handout, did practice exercises to assist patient identify ways thoughts affect feelings and behavior, assigned patient to review handouts and complete practice exercises  Current Status:   Improved mood,decreased feelings of loneliness, decreased anxiety, continued worry    Suicidal/Homicidal:   No  Patient Progress:   Patient last was seen 4 weeks ago. She reports increased thoughts about moving to [place] and states she is going back and forth regarding her decision. She has placed her name on the waiting list at 2 different senior complexes. She expresses fear that her son in [place] may not be able to handle it if her condition declines. These thoughts appear to be triggered by patient's experience with her mother when her mother's health declined. She continues to report a pattern of negative thinking.     Target Goals:   1. Learn and implement behavioral strategies to overcome depression.    2. Learn and implement problem-solving and decision-making skills    3. Identify and replace thoughts and beliefs that support depression and anxiety with healthy alternatives  Last Reviewed:   04/18/2017  Goals Addressed Today:    1,2,3  Plan:     Return in 2 weeks  Impression/Diagnosis:   Patient presents with symptoms of depression and anxiety that have been present for about 2 years. Symptoms appear to have been precipitated by her move which is a great distance from family and lifetime friends and loneliness. Current symptoms include depressed mood, anxiety, poor concentration, irritability.   Diagnosis:  Axis I:  Depressive Disorder          Axis II: Deferred    Kanin Lia, LCSW 04/18/2017

## 2017-04-20 ENCOUNTER — Telehealth: Payer: Self-pay | Admitting: Pediatrics

## 2017-05-03 ENCOUNTER — Ambulatory Visit (HOSPITAL_COMMUNITY): Payer: Self-pay | Admitting: Psychiatry

## 2017-05-03 ENCOUNTER — Telehealth (HOSPITAL_COMMUNITY): Payer: Self-pay | Admitting: *Deleted

## 2017-05-03 NOTE — Telephone Encounter (Signed)
returned phone call to patient, spoke with her, she said she was at an appointment and would call back.

## 2017-05-18 ENCOUNTER — Other Ambulatory Visit: Payer: Self-pay

## 2017-05-18 ENCOUNTER — Encounter: Payer: Self-pay | Admitting: Pharmacist Clinician (PhC)/ Clinical Pharmacy Specialist

## 2017-05-18 ENCOUNTER — Encounter: Payer: Self-pay | Admitting: Family Medicine

## 2017-05-18 ENCOUNTER — Ambulatory Visit (INDEPENDENT_AMBULATORY_CARE_PROVIDER_SITE_OTHER): Payer: Medicare Other | Admitting: Family Medicine

## 2017-05-18 VITALS — BP 132/84 | HR 80 | Temp 96.0°F | Resp 16 | Ht 66.5 in | Wt 209.1 lb

## 2017-05-18 DIAGNOSIS — I10 Essential (primary) hypertension: Secondary | ICD-10-CM

## 2017-05-18 DIAGNOSIS — Z86718 Personal history of other venous thrombosis and embolism: Secondary | ICD-10-CM | POA: Diagnosis not present

## 2017-05-18 DIAGNOSIS — K5900 Constipation, unspecified: Secondary | ICD-10-CM

## 2017-05-18 DIAGNOSIS — Z23 Encounter for immunization: Secondary | ICD-10-CM

## 2017-05-18 DIAGNOSIS — E7800 Pure hypercholesterolemia, unspecified: Secondary | ICD-10-CM

## 2017-05-18 NOTE — Patient Instructions (Addendum)
Walk every day that you are able Drink plenty of water Take the miralax in the morning Take another dose at bedtime if you did not have a BM that day Let me know if you need a GI referral I have placed a cardiology referral back to Dr Percival Spanish  See me in 3 months Call sooner for problems

## 2017-05-18 NOTE — Progress Notes (Signed)
Chief Complaint  Patient presents with  . Hypertension  This is a new patient to establish with the office She has multiple medical problems that are all being very well managed by her current primary care provider.  She states that 1 of the favorite people at her prior office left, and she wishes to change to a new family practice provider.  She has hypertension.  She is on amlodipine, metoprolol, hydrochlorothiazide and olmesartan.  She thinks 1 of these medicines is contributing to her chronic constipation.  We had a long discussion about chronic constipation.  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  I expressed the importance of high-fiber diet, drinking water, mobility, and fiber supplements to her.  We discussed her use of Colace and MiraLAX.  She needs to get on a regular regimen of MiraLAX daily.  She is told to take a second dose of MiraLAX in the evening if she has not had a bowel movement that day.  I offered her referral to gastroenterology.  She has gone to a GI doctor in the past but not for many years.  She does not think she needs one right now. She previously saw a cardiologist.  She requested to go back to this cardiologist and states she needs a new referral.  This is offered for her today. She has had multiple DVTs.  She remembers at least 4.  There are was in her lower extremities.  She has a Greenfield filter in her IVC.  She is on chronic Coumadin.  She is followed at Coumadin clinic.  She is careful with the greens in her diet.  Her INR has been well managed. She is up-to-date with her health maintenance.  She needs a flu shot today. She needs to engage in regular exercise.  This is recommended to her.  She states that her diet is not good and that she "loves fast food".  A heart healthy diet is encouraged for her. She moved to [place] from [place] 5 years ago.  She did this to be closer to friends.  She now misses her family and is thinking of moving  back.  She is seeing a Social worker and finds this very helpful.  Patient Active Problem List   Diagnosis Date Noted  . History of DVT of lower extremity 07/12/2016  . HLD (hyperlipidemia) 10/28/2015  . Encounter for long-term (current) use of high-risk medication 07/29/2015  . Heel pain, bilateral 05/20/2015  . Acquired bilateral flat feet 05/20/2015  . Trochanteric bursitis of both hips 05/20/2015  . Tenderness of right calf 05/20/2015  . Depression 08/17/2014  . DVT, lower extremity, recurrent, right ([place]) 07/21/2013  . Constipation - functional 05/13/2013  . GERD (gastroesophageal reflux disease)   . 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 Blood clotting tendency ([place])   . Unspecified vitamin D deficiency 12/24/2012  . Obesity 12/24/2012  . Essential hypertension 10/17/2012  . Other and unspecified hyperlipidemia 10/17/2012    Outpatient Encounter Medications as of 05/18/2017  Medication Sig  . amLODipine (NORVASC) 10 MG tablet TAKE 1 TABLET (10 MG TOTAL) BY MOUTH DAILY.  . cholecalciferol (VITAMIN D) 1000 UNITS tablet Take 2,000 Units by mouth daily.  Marland Kitchen docusate sodium (COLACE) 100 MG capsule Take 100 mg by mouth daily as needed for mild constipation.  . hydrochlorothiazide (HYDRODIURIL) 25 MG tablet TAKE 1 TABLET BY MOUTH ONCE DAILY  . metoprolol tartrate (LOPRESSOR) 25 MG tablet Take 0.5 tablets (12.5 mg  total) by mouth 2 (two) times daily.  . Multiple Vitamins-Minerals (CENTRUM SILVER PO) Take 1 tablet by mouth every other day.  . polyethylene glycol powder (GLYCOLAX/MIRALAX) powder Take 17 g by mouth daily.  . pravastatin (PRAVACHOL) 40 MG tablet Take 1 tablet (40 mg total) by mouth every evening.  . warfarin (COUMADIN) 5 MG tablet TAKE ONE TO ONE AND ONE-HALF TABLETS BY MOUTH ONCE DAILY AS DIRECTED  . olmesartan (BENICAR) 40 MG tablet    No facility-administered encounter medications on file as of 05/18/2017.     Allergies  Allergen Reactions  . Atorvastatin Other (See Comments)      Myalgias   . Crestor [Rosuvastatin Calcium] Other (See Comments)    Myalgias / right sided weakness   . Tetracyclines & Related     Nausea, pain    Review of Systems  Constitutional: Negative for activity change, appetite change and unexpected weight change.  HENT: Negative for congestion, dental problem, postnasal drip and rhinorrhea.   Eyes: Negative for redness and visual disturbance.  Respiratory: Negative for cough and shortness of breath.   Cardiovascular: Negative for chest pain, palpitations and leg swelling.  Gastrointestinal: Positive for constipation. Negative for abdominal pain and diarrhea.       Occasional bloating and constipation  Genitourinary: Negative for difficulty urinating and frequency.  Musculoskeletal: Negative for arthralgias and back pain.  Neurological: Negative for dizziness and headaches.  Psychiatric/Behavioral: Positive for dysphoric mood. Negative for sleep disturbance. The patient is not nervous/anxious.        Misses children and grandchildren    BP 132/84 (BP Location: Right Arm, Patient Position: Sitting, Cuff Size: Large)   Pulse 80   Temp (!) 96 F (35.6 C) (Temporal)   Resp 16   Ht 5' 6.5" (1.689 m)   Wt 209 lb 1.3 oz (94.8 kg)   SpO2 97%   BMI 33.24 kg/m   Physical Exam  Constitutional: She is oriented to person, place, and time. She appears well-developed and well-nourished.  HENT:  Head: Normocephalic and atraumatic.  Mouth/Throat: Oropharynx is clear and moist.  Eyes: Conjunctivae are normal. Pupils are equal, round, and reactive to light.  Neck: Normal range of motion. Neck supple. No thyromegaly present.  Cardiovascular: Normal rate, regular rhythm and normal heart sounds.  Pulmonary/Chest: Effort normal and breath sounds normal. No respiratory distress.  Musculoskeletal: Normal range of motion. She exhibits edema.  Edema and hyperpigmentation in both ankles  Neurological: She is alert and oriented to person, place, and  time.  Gait normal  Skin: Skin is warm and dry.  Psychiatric: She has a normal mood and affect. Her behavior is normal. Thought content normal.  Nursing note and vitals reviewed.   ASSESSMENT/PLAN:  1. Need for influenza vaccination - Flu Vaccine QUAD 36+ mos IM  2. Constipation, unspecified constipation type Discussion regarding slow motility.  She found Amatiza too expensive.  We talked about management with food, fluids, exercise, MiraLAX, and Colace  3. History of DVT of lower extremity Chronic Coumadin therapy  4. Essential hypertension Well-controlled.  Multiple medications - CBC - COMPLETE METABOLIC PANEL WITH GFR - Lipid panel - Urinalysis, Routine w reflex microscopic - Ambulatory referral to Cardiology  5. Pure hypercholesterolemia By history.  On pravastatin - CBC - COMPLETE METABOLIC PANEL WITH GFR - Lipid panel - Urinalysis, Routine w reflex microscopic   Patient Instructions  Walk every day that you are able Drink plenty of water Take the miralax in the morning Take another dose  at bedtime if you did not have a BM that day Let me know if you need a GI referral I have placed a cardiology referral back to Dr Percival Spanish  See me in 3 months Call sooner for problems    Raylene Everts, MD

## 2017-05-21 ENCOUNTER — Encounter: Payer: Self-pay | Admitting: Pediatrics

## 2017-05-25 ENCOUNTER — Encounter: Payer: Medicare Other | Admitting: Pharmacist Clinician (PhC)/ Clinical Pharmacy Specialist

## 2017-05-26 ENCOUNTER — Other Ambulatory Visit: Payer: Self-pay | Admitting: Pediatrics

## 2017-05-26 DIAGNOSIS — I493 Ventricular premature depolarization: Secondary | ICD-10-CM

## 2017-05-28 ENCOUNTER — Encounter: Payer: Self-pay | Admitting: Family Medicine

## 2017-06-11 ENCOUNTER — Telehealth: Payer: Self-pay | Admitting: Cardiology

## 2017-06-11 NOTE — Telephone Encounter (Signed)
New message      Patient would like to switch from [facility] to a [employer], so that it is closer to her please call to advise

## 2017-06-11 NOTE — Telephone Encounter (Signed)
OK 

## 2017-06-12 NOTE — Telephone Encounter (Signed)
spojke with pt letting her know it ok to follow-up in [place]

## 2017-06-22 ENCOUNTER — Ambulatory Visit (INDEPENDENT_AMBULATORY_CARE_PROVIDER_SITE_OTHER): Payer: Medicare Other | Admitting: Pharmacist Clinician (PhC)/ Clinical Pharmacy Specialist

## 2017-06-22 DIAGNOSIS — I10 Essential (primary) hypertension: Secondary | ICD-10-CM

## 2017-06-22 DIAGNOSIS — I270 Primary pulmonary hypertension: Secondary | ICD-10-CM

## 2017-06-22 DIAGNOSIS — Z79899 Other long term (current) drug therapy: Secondary | ICD-10-CM | POA: Diagnosis not present

## 2017-06-22 DIAGNOSIS — Z86718 Personal history of other venous thrombosis and embolism: Secondary | ICD-10-CM

## 2017-06-22 DIAGNOSIS — D689 Coagulation defect, unspecified: Secondary | ICD-10-CM

## 2017-06-22 LAB — COAGUCHEK XS/INR WAIVED
INR: 2.4 — ABNORMAL HIGH (ref 0.9–1.1)
PROTHROMBIN TIME: 28.7 s

## 2017-06-22 MED ORDER — VALSARTAN 40 MG PO TABS: 320.0000 mg | ORAL_TABLET | Freq: Every day | ORAL | Status: DC

## 2017-06-22 NOTE — Patient Instructions (Signed)
Description   Continue current warfarin 5mg  dose - take 1 and 1/2 on Mondays and Fridays. Take 1 tablet all other days.  INR today is 2.4 today

## 2017-06-25 ENCOUNTER — Telehealth: Payer: Self-pay | Admitting: Family Medicine

## 2017-06-25 NOTE — Telephone Encounter (Signed)
Does she need a new referral for this?

## 2017-06-25 NOTE — Telephone Encounter (Signed)
Pt wants a referral to a Heart Dr that is closer (not [facility]). Also the patient is taken Olmesatan 40mg  and is having flu like symptoms, can something else be called in for her. Pharmacy  [facility] in [place]....[phone]

## 2017-06-26 NOTE — Telephone Encounter (Signed)
No, I updated the current referral for a [place] location

## 2017-06-26 NOTE — Telephone Encounter (Signed)
Left message to call office

## 2017-06-26 NOTE — Telephone Encounter (Signed)
Spoke to Renee Pratt, seems confused about meds, made appt to see dr Meda Coffee Monday 12 24 18.

## 2017-07-02 ENCOUNTER — Encounter: Payer: Self-pay | Admitting: Family Medicine

## 2017-07-02 ENCOUNTER — Other Ambulatory Visit: Payer: Self-pay

## 2017-07-02 ENCOUNTER — Ambulatory Visit (INDEPENDENT_AMBULATORY_CARE_PROVIDER_SITE_OTHER): Payer: Medicare Other | Admitting: Family Medicine

## 2017-07-02 VITALS — BP 134/82 | HR 72 | Temp 96.3°F | Resp 16 | Ht 67.0 in | Wt 217.1 lb

## 2017-07-02 DIAGNOSIS — I10 Essential (primary) hypertension: Secondary | ICD-10-CM | POA: Diagnosis not present

## 2017-07-02 NOTE — Patient Instructions (Signed)
I will call Renee Pratt at the pharmacy about the valsartan/olmasartan issue No change in medicine today We will call you with her advice

## 2017-07-02 NOTE — Progress Notes (Signed)
Chief Complaint  Patient presents with  . Medication Problem  Renee Pratt and was taking this, but developed flulike symptoms.  Her blood pressure is well controlled.  She sees the pharmacist at [facility] for her Coumadin management, INR testing.  I reviewed this note from 06/22/17.  It appears the pharmacist, Renee Pratt advised her to go back on valsartan 320 mg a day.  Unfortunately, this prescription was not sent to the pharmacy.  She is still taking the Pratt, only sporadically because of the side effect.  She states she did take it today.  Patient Active Problem List   Diagnosis Date Noted  . History of DVT of lower extremity 07/12/2016  . HLD (hyperlipidemia) 10/28/2015  . Encounter for long-term (current) use of high-risk medication 07/29/2015  . Heel pain, bilateral 05/20/2015  . Acquired bilateral flat feet 05/20/2015  . Trochanteric bursitis of both hips 05/20/2015  . Tenderness of right calf 05/20/2015  . Depression 08/17/2014  . DVT, lower extremity, recurrent, right ([place]) 07/21/2013  . Constipation - functional 05/13/2013  . GERD (gastroesophageal reflux disease)   . 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 Blood clotting tendency ([place])   . Unspecified vitamin D deficiency 12/24/2012  . Obesity 12/24/2012  . Essential hypertension 10/17/2012  . Other and unspecified hyperlipidemia 10/17/2012    Outpatient Encounter Medications as of 07/02/2017  Medication Sig  . amLODipine (NORVASC) 10 MG tablet TAKE 1 TABLET (10 MG TOTAL) BY MOUTH DAILY.  Marland Kitchen docusate sodium (COLACE) 100 MG capsule Take 100 mg by mouth daily as needed for mild constipation.  . hydrochlorothiazide (HYDRODIURIL) 25 MG tablet TAKE 1 TABLET BY MOUTH ONCE DAILY  . metoprolol tartrate (LOPRESSOR) 25 MG tablet TAKE  1/2 (ONE-HALF) TABLET BY MOUTH TWICE DAILY  . Multiple Vitamins-Minerals (CENTRUM SILVER PO) Take 1 tablet by mouth every other day.  . pravastatin (PRAVACHOL) 40 MG tablet Take 1 tablet (40 mg total) by mouth every evening.  . warfarin (COUMADIN) 5 MG tablet TAKE ONE TO ONE AND ONE-HALF TABLETS BY MOUTH ONCE DAILY AS DIRECTED  . [DISCONTINUED] cholecalciferol (VITAMIN D) 1000 UNITS tablet Take 2,000 Units by mouth daily.  . [DISCONTINUED] polyethylene glycol powder (GLYCOLAX/MIRALAX) powder Take 17 g by mouth daily. (Patient not taking: Reported on 07/02/2017)   Facility-Administered Encounter Medications as of 07/02/2017  Medication  . valsartan (DIOVAN) tablet 320 mg    Allergies  Allergen Reactions  . Atorvastatin Other (See Comments)    Myalgias   . Crestor [Rosuvastatin Calcium] Other (See Comments)    Myalgias / right sided weakness   . Tetracyclines & Related     Nausea, pain    Review of Systems  Constitutional: Negative for activity change, appetite change and unexpected weight change.  HENT: Negative for congestion, dental problem, postnasal drip and rhinorrhea.   Eyes: Negative for redness and visual disturbance.  Respiratory: Negative for cough and shortness of breath.   Cardiovascular: Negative for chest pain, palpitations and leg swelling.  Gastrointestinal: Positive for constipation. Negative for abdominal pain and diarrhea.       Occasional bloating and constipation  Genitourinary: Negative for difficulty urinating and frequency.  Musculoskeletal: Negative for arthralgias and back pain.  Neurological: Negative for dizziness and headaches.  Psychiatric/Behavioral: Positive for dysphoric mood. Negative for sleep disturbance. The patient is  not nervous/anxious.        Misses children and grandchildren especially at holiday    BP 134/82 (BP Location: Right Arm, Patient Position: Sitting, Cuff Size: Normal)   Pulse 72   Temp (!) 96.3 F (35.7 C) (Temporal)    Resp 16   Ht 5\' 7"  (1.702 m)   Wt 217 lb 1.9 oz (98.5 kg)   SpO2 98%   BMI 34.01 kg/m   Physical Exam  Constitutional: She is oriented to person, place, and time. She appears well-developed and well-nourished. No distress.  HENT:  Head: Normocephalic and atraumatic.  Mouth/Throat: Oropharynx is clear and moist.  Neck: Normal range of motion. No thyromegaly present.  Cardiovascular: Normal rate, regular rhythm and normal heart sounds.  Pulmonary/Chest: Effort normal and breath sounds normal.  Lymphadenopathy:    She has no cervical adenopathy.  Neurological: She is alert and oriented to person, place, and time.  Skin: Skin is warm and dry.  Psychiatric: She has a normal mood and affect. Her behavior is normal.   ASSESSMENT/PLAN:  1. Essential hypertension I will contact the pharmacist and see about medicine and recommendations.  Her blood pressure is well controlled, but I do believe she needs to be on an ARB..  If she is afraid of valsartan perhaps we could try losartan.   Patient Instructions  I will call Renee Pratt at the pharmacy about the valsartan/olmasartan issue No change in medicine today We will call you with her advice    Renee Everts, MD

## 2017-07-04 ENCOUNTER — Ambulatory Visit: Payer: Medicare Other | Admitting: Cardiology

## 2017-07-05 ENCOUNTER — Telehealth: Payer: Self-pay | Admitting: Family Medicine

## 2017-07-05 NOTE — Telephone Encounter (Signed)
Pt left message and is wanting to know if you called [facility] about her Medicine

## 2017-07-06 ENCOUNTER — Telehealth: Payer: Self-pay | Admitting: Family Medicine

## 2017-07-06 NOTE — Telephone Encounter (Signed)
Pt is calling in again regarding the medicine, pt is completely out of medicine--I advised Dr Meda Coffee is out of office until Monday - she said she would wait for Dr Meda Coffee to call on Monday

## 2017-07-09 MED ORDER — VALSARTAN 320 MG PO TABS: 320.0000 mg | ORAL_TABLET | Freq: Every day | ORAL | 0 refills | Status: DC

## 2017-07-09 NOTE — Addendum Note (Signed)
Addended by: Ova Freshwater on: 07/09/2017 12:42 PM   Modules accepted: Orders

## 2017-07-09 NOTE — Telephone Encounter (Signed)
What medicine?

## 2017-07-09 NOTE — Telephone Encounter (Signed)
Diovan, new rx sent.

## 2017-07-12 ENCOUNTER — Telehealth: Payer: Self-pay | Admitting: Family Medicine

## 2017-07-12 NOTE — Telephone Encounter (Signed)
Pt stopped by  And stated that Valstatin is on Back order,,,but the Pharmacist stated she could possibly use   Losartan Telmisartan Irbesartan

## 2017-07-13 MED ORDER — LOSARTAN POTASSIUM 100 MG PO TABS: 100.0000 mg | ORAL_TABLET | Freq: Every day | ORAL | 0 refills | Status: DC

## 2017-07-13 NOTE — Telephone Encounter (Signed)
Called Renee Pratt aware of med change. Is coming in next Thursday for a b/p check. Nurse visit. -- is asking for a cardiologist in [place]? Does not need a referral.

## 2017-07-13 NOTE — Telephone Encounter (Signed)
Renee Pratt or Renee Pratt

## 2017-07-13 NOTE — Telephone Encounter (Signed)
Called Omar, given names and phone numbers.

## 2017-07-13 NOTE — Telephone Encounter (Signed)
Give her losartan (cozaar) 100 mg once a day Come in for a BP check on the new medicine in 1-2 weeks (nurse)

## 2017-07-19 ENCOUNTER — Encounter: Payer: Self-pay | Admitting: Family Medicine

## 2017-07-19 ENCOUNTER — Ambulatory Visit (INDEPENDENT_AMBULATORY_CARE_PROVIDER_SITE_OTHER): Payer: Medicare Other | Admitting: Family Medicine

## 2017-07-19 ENCOUNTER — Other Ambulatory Visit: Payer: Self-pay

## 2017-07-19 VITALS — BP 132/70 | HR 80 | Temp 96.4°F | Resp 18 | Ht 67.0 in | Wt 218.1 lb

## 2017-07-19 DIAGNOSIS — M7918 Myalgia, other site: Secondary | ICD-10-CM

## 2017-07-19 DIAGNOSIS — I10 Essential (primary) hypertension: Secondary | ICD-10-CM | POA: Diagnosis not present

## 2017-07-19 MED ORDER — METAXALONE 400 MG PO TABS: 400.0000 mg | ORAL_TABLET | Freq: Three times a day (TID) | ORAL | 0 refills | Status: DC

## 2017-07-19 NOTE — Progress Notes (Signed)
Chief Complaint  Patient presents with  . Generalized Body Aches   Patient is here for an acute appointment.  She called this morning complaining of body pain.  She thinks is a drug reaction.  Interestingly, this is been going on for over a month.  She was on valsartan, and this was recalled.  She was changed to another blood pressure medicine ibesartan and thought that the new medicine was causing her body pain.  She was subsequently changed to losartan, and thinks this is caused the reaction.  Actually as I investigate her complaint, she just has muscle soreness in her upper trapezius and shoulder blade area that is been going on since before she was placed on these medications.  He goes up the back of her head and causes her to have headaches.  No accident, no injury, no overuse.  She thinks she might have "slept wrong".  If she takes an Aleve she feels better.  I have warned her today not to take Aleve, but this is not a good medicine to take with warfarin.  She may take acetaminophen in its place. Her blood pressure is well controlled on the losartan.  At rest, I get 130/70.  She has not had problems with her neck or upper back in the past.  She does not have arthritis in her neck upper back, neck stiffness.  She has no numbness or weakness in her arms consistent with radiculopathy.  She does not exercise, has not done any lifting or activity to aggravate her muscles.  She does not have generalized myopathy or muscle pain.  Patient Active Problem List   Diagnosis Date Noted  . History of DVT of lower extremity 07/12/2016  . HLD (hyperlipidemia) 10/28/2015  . Encounter for long-term (current) use of high-risk medication 07/29/2015  . Heel pain, bilateral 05/20/2015  . Acquired bilateral flat feet 05/20/2015  . Trochanteric bursitis of both hips 05/20/2015  . Tenderness of right calf 05/20/2015  . Depression 08/17/2014  . 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  Constipation - functional 05/13/2013  . GERD (gastroesophageal reflux disease)   . 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 Blood clotting tendency ([place])   . Unspecified vitamin D deficiency 12/24/2012  . Obesity 12/24/2012  . Essential hypertension 10/17/2012    Outpatient Encounter Medications as of 07/19/2017  Medication Sig  . amLODipine (NORVASC) 10 MG tablet TAKE 1 TABLET (10 MG TOTAL) BY MOUTH DAILY.  Marland Kitchen docusate sodium (COLACE) 100 MG capsule Take 100 mg by mouth daily as needed for mild constipation.  . hydrochlorothiazide (HYDRODIURIL) 25 MG tablet TAKE 1 TABLET BY MOUTH ONCE DAILY  . losartan (COZAAR) 100 MG tablet Take 1 tablet (100 mg total) by mouth daily.  . metoprolol tartrate (LOPRESSOR) 25 MG tablet TAKE 1/2 (ONE-HALF) TABLET BY MOUTH TWICE DAILY  . Multiple Vitamins-Minerals (CENTRUM SILVER PO) Take 1 tablet by mouth every other day.  . pravastatin (PRAVACHOL) 40 MG tablet Take 1 tablet (40 mg total) by mouth every evening.  . warfarin (COUMADIN) 5 MG tablet TAKE ONE TO ONE AND ONE-HALF TABLETS BY MOUTH ONCE DAILY AS DIRECTED  . metaxalone (SKELAXIN) 400 MG tablet Take 1 tablet (400 mg total) by mouth 3 (three) times daily. As needed muscle pain   No facility-administered encounter medications on file as of 07/19/2017.     Allergies  Allergen Reactions  . Atorvastatin Other (See Comments)    Myalgias   . Crestor [Rosuvastatin Calcium] Other (  See Comments)    Myalgias / right sided weakness   . Tetracyclines & Related     Nausea, pain    Review of Systems  Constitutional: Negative for activity change, appetite change and unexpected weight change.  HENT: Negative for congestion, dental problem, postnasal drip and rhinorrhea.   Eyes: Negative for redness and visual disturbance.  Respiratory: Negative for cough and shortness of breath.   Cardiovascular: Negative for chest pain, palpitations and leg swelling.  Gastrointestinal: Positive for constipation. Negative for  abdominal pain and diarrhea.       Occasional bloating and constipation  Genitourinary: Negative for difficulty urinating and frequency.  Musculoskeletal: Positive for myalgias. Negative for arthralgias and back pain.  Neurological: Negative for dizziness and headaches.  Psychiatric/Behavioral: Negative for dysphoric mood and sleep disturbance. The patient is not nervous/anxious.     BP 132/70   Pulse 80   Temp (!) 96.4 F (35.8 C) (Temporal)   Resp 18   Ht 5\' 7"  (1.702 m)   Wt 218 lb 1.9 oz (98.9 kg)   SpO2 98%   BMI 34.16 kg/m   Physical Exam  Constitutional: She is oriented to person, place, and time. She appears well-developed and well-nourished. No distress.  HENT:  Head: Normocephalic and atraumatic.  Mouth/Throat: Oropharynx is clear and moist.  Eyes: EOM are normal. Pupils are equal, round, and reactive to light.  Neck: Normal range of motion. Neck supple.  Cardiovascular: Normal rate, regular rhythm and normal heart sounds.  Pulmonary/Chest: Effort normal and breath sounds normal.  Musculoskeletal: Normal range of motion. She exhibits no edema.       Arms: Strength sensation range of motion reflexes are intact in both upper extremities symmetrically  Lymphadenopathy:    She has no cervical adenopathy.  Neurological: She is alert and oriented to person, place, and time.  Psychiatric: She has a normal mood and affect. Her behavior is normal.    ASSESSMENT/PLAN:  1. Rhomboid myalgia Muscular pain of trapezius and rhomboid region.  Likely muscle strain of some sort. - Ambulatory referral to Physical Therapy  2. Essential hypertension Controlled   Patient Instructions  Muscle pain in the shoulders is not from your medicine Take the muscle relaxer 3 times a day as needed Use warmth to the area Gentle stretches 2 or 3 times a day See a physical therapist Call if not better in a couple weeks Your blood pressure is doing great    Raylene Everts, MD

## 2017-07-19 NOTE — Patient Instructions (Addendum)
Muscle pain in the shoulders is not from your medicine Take the muscle relaxer 3 times a day as needed Use warmth to the area Gentle stretches 2 or 3 times a day See a physical therapist Call if not better in a couple weeks Your blood pressure is doing great

## 2017-08-03 ENCOUNTER — Encounter: Payer: Self-pay | Admitting: Pharmacist Clinician (PhC)/ Clinical Pharmacy Specialist

## 2017-08-10 ENCOUNTER — Ambulatory Visit (INDEPENDENT_AMBULATORY_CARE_PROVIDER_SITE_OTHER): Payer: Medicare Other | Admitting: *Deleted

## 2017-08-10 DIAGNOSIS — Z86718 Personal history of other venous thrombosis and embolism: Secondary | ICD-10-CM

## 2017-08-10 DIAGNOSIS — D689 Coagulation defect, unspecified: Secondary | ICD-10-CM

## 2017-08-10 DIAGNOSIS — Z79899 Other long term (current) drug therapy: Secondary | ICD-10-CM

## 2017-08-10 LAB — COAGUCHEK XS/INR WAIVED
INR: 2.4 — ABNORMAL HIGH (ref 0.9–1.1)
Prothrombin Time: 28.4 s

## 2017-08-10 NOTE — Progress Notes (Signed)
Subjective:     Indication: DVT Bleeding signs/symptoms: None Thromboembolic signs/symptoms: None  Missed Coumadin doses: None Medication changes: no Dietary changes: no Bacterial/viral infection: no Other concerns: yes - Patient's PCP is Dr Meda Coffee in [place]. She hasn't been seen by a provider in our clinic since Aug 2018.  The following portions of the patient's history were reviewed and updated as appropriate: allergies and current medications.  Review of Systems Pertinent items are noted in HPI.   Objective:    INR Today: 2.4 Current dose: 7.5mg  on Mon and Fri and 5 mg all other days    Assessment:    Therapeutic INR for goal of 2-3   Plan:    1. New dose: no change   2. Next INR: 1 month with Dr Meda Coffee or coumadin clinic at her cardiology office   Patient made aware that we will no longer be able to manage her coumadin therapy if her PCP is at another family practice. She stated understanding and will follow up with Dr Meda Coffee at her next visit on 08/17/17.  Chong Sicilian, RN

## 2017-08-13 ENCOUNTER — Encounter: Payer: Self-pay | Admitting: Family Medicine

## 2017-08-13 ENCOUNTER — Other Ambulatory Visit: Payer: Self-pay

## 2017-08-13 ENCOUNTER — Ambulatory Visit (INDEPENDENT_AMBULATORY_CARE_PROVIDER_SITE_OTHER): Payer: Medicare Other | Admitting: Family Medicine

## 2017-08-13 VITALS — BP 150/88 | HR 80 | Temp 97.0°F | Resp 18 | Ht 67.0 in | Wt 218.0 lb

## 2017-08-13 DIAGNOSIS — L02411 Cutaneous abscess of right axilla: Secondary | ICD-10-CM | POA: Diagnosis not present

## 2017-08-13 DIAGNOSIS — I493 Ventricular premature depolarization: Secondary | ICD-10-CM | POA: Diagnosis not present

## 2017-08-13 MED ORDER — HYDROCHLOROTHIAZIDE 25 MG PO TABS: 25.0000 mg | ORAL_TABLET | Freq: Every day | ORAL | 3 refills | Status: DC

## 2017-08-13 MED ORDER — METOPROLOL TARTRATE 25 MG PO TABS: ORAL_TABLET | ORAL | 3 refills | Status: DC

## 2017-08-13 MED ORDER — SULFAMETHOXAZOLE-TRIMETHOPRIM 800-160 MG PO TABS: 1.0000 | ORAL_TABLET | Freq: Two times a day (BID) | ORAL | 0 refills | Status: DC

## 2017-08-13 MED ORDER — AMLODIPINE BESYLATE 10 MG PO TABS: 10.0000 mg | ORAL_TABLET | Freq: Every day | ORAL | 3 refills | Status: DC

## 2017-08-13 NOTE — Patient Instructions (Addendum)
  Take the septra twice a day for 7 days Warm compresses to area under arm 2-3 times a day See me on Friday for re check  Call when it is time to do an INR and we will instruct

## 2017-08-13 NOTE — Progress Notes (Signed)
Chief Complaint  Patient presents with  . Recurrent Skin Infections   Patient has had a knot under her right arm for some time.  It was noted when she had her mammogram.  It has been determined to be benign.  She does not became very swollen and very painful last week.  Finally, yesterday, it ruptured.  Malodorous dark drainage with solid pieces has been and coming out of the area.  The pain is somewhat improved.  No redness of the skin.  No fever or chills.  She has not noticed that she has any increase in her blood sugar.  She is on Coumadin but has not had bleeding.  Patient Active Problem List   Diagnosis Date Noted  . History of DVT of lower extremity 07/12/2016  . HLD (hyperlipidemia) 10/28/2015  . Encounter for long-term (current) use of high-risk medication 07/29/2015  . Heel pain, bilateral 05/20/2015  . Acquired bilateral flat feet 05/20/2015  . Trochanteric bursitis of both hips 05/20/2015  . Tenderness of right calf 05/20/2015  . Depression 08/17/2014  . 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 Constipation - functional 05/13/2013  . GERD (gastroesophageal reflux disease)   . 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 Blood clotting tendency ([place])   . Unspecified vitamin D deficiency 12/24/2012  . Obesity 12/24/2012  . Essential hypertension 10/17/2012    Outpatient Encounter Medications as of 08/13/2017  Medication Sig  . amLODipine (NORVASC) 10 MG tablet Take 1 tablet (10 mg total) by mouth daily.  Marland Kitchen docusate sodium (COLACE) 100 MG capsule Take 100 mg by mouth daily as needed for mild constipation.  . hydrochlorothiazide (HYDRODIURIL) 25 MG tablet Take 1 tablet (25 mg total) by mouth daily.  Marland Kitchen losartan (COZAAR) 100 MG tablet Take 1 tablet (100 mg total) by mouth daily.  . metaxalone (SKELAXIN) 400 MG tablet Take 1 tablet (400 mg total) by mouth 3 (three) times daily. As needed muscle pain  . metoprolol tartrate (LOPRESSOR) 25 MG tablet TAKE 1/2 (ONE-HALF)  TABLET BY MOUTH TWICE DAILY  . Multiple Vitamins-Minerals (CENTRUM SILVER PO) Take 1 tablet by mouth every other day.  . pravastatin (PRAVACHOL) 40 MG tablet Take 1 tablet (40 mg total) by mouth every evening.  . warfarin (COUMADIN) 5 MG tablet TAKE ONE TO ONE AND ONE-HALF TABLETS BY MOUTH ONCE DAILY AS DIRECTED  . sulfamethoxazole-trimethoprim (BACTRIM DS,SEPTRA DS) 800-160 MG tablet Take 1 tablet by mouth 2 (two) times daily.   No facility-administered encounter medications on file as of 08/13/2017.     Allergies  Allergen Reactions  . Atorvastatin Other (See Comments)    Myalgias   . Crestor [Rosuvastatin Calcium] Other (See Comments)    Myalgias / right sided weakness   . Tetracyclines & Related     Nausea, pain    Review of Systems  Constitutional: Negative for activity change, appetite change, chills, fatigue and fever.  HENT: Negative for congestion and dental problem.   Skin: Positive for color change and wound.  Neurological: Negative for dizziness and headaches.  Psychiatric/Behavioral: Negative for sleep disturbance. The patient is not nervous/anxious.   All other systems reviewed and are negative.   BP (!) 150/88 (BP Location: Left Arm, Patient Position: Sitting, Cuff Size: Normal)   Pulse 80   Temp (!) 97 F (36.1 C) (Temporal)   Resp 18   Ht 5\' 7"  (1.702 m)   Wt 218 lb 0.6 oz (98.9 kg)   SpO2 97%   BMI  34.15 kg/m   Physical Exam  Constitutional: She is oriented to person, place, and time. She appears well-developed and well-nourished. No distress.  HENT:  Head: Normocephalic and atraumatic.  Mouth/Throat: Oropharynx is clear and moist.  Cardiovascular: Normal rate, regular rhythm and normal heart sounds.  Pulmonary/Chest: Effort normal and breath sounds normal.  Abdominal: Soft. Bowel sounds are normal.  Neurological: She is alert and oriented to person, place, and time.  Skin: Skin is warm and dry. There is erythema.  Right axilla examined.  There is a  very firm indurated area in the central axilla that measures 4 x 6 across.  In the middle of this mass there is a 2 cm open excoriated area of skin with punctate deeper wounds.  Sebum and purulence can be expressed.  Patient does not tolerate any palpation of the area, or pressure around the abscess to express contents.  Psychiatric: She has a normal mood and affect. Her behavior is normal. Thought content normal.    ASSESSMENT/PLAN:  1. Cutaneous abscess of right axilla Ruptured.  I suspect this was a sebaceous cyst that is been present for some time and became secondarily infected.  It has spontaneously drained.  There is still sebum  in the center of this cyst, no palpable fluctuance.  I discussed with her antibiotics, warm compresses, and gentle pressure to try to empty the cyst.  She will come back in a couple of days and I will do an I&D if necessary.  2. PVC (premature ventricular contraction) By history.  Patient requests refill - metoprolol tartrate (LOPRESSOR) 25 MG tablet; TAKE 1/2 (ONE-HALF) TABLET BY MOUTH TWICE DAILY  Dispense: 45 tablet; Refill: 3   Patient Instructions   Take the septra twice a day for 7 days Warm compresses to area under arm 2-3 times a day See me on Friday for re check  Call when it is time to do an INR and we will instruct   Raylene Everts, MD

## 2017-08-14 NOTE — Telephone Encounter (Signed)
close

## 2017-08-17 ENCOUNTER — Ambulatory Visit: Payer: Self-pay | Admitting: Family Medicine

## 2017-08-21 ENCOUNTER — Encounter: Payer: Self-pay | Admitting: Family Medicine

## 2017-08-21 ENCOUNTER — Ambulatory Visit (INDEPENDENT_AMBULATORY_CARE_PROVIDER_SITE_OTHER): Payer: Medicare Other | Admitting: Family Medicine

## 2017-08-21 ENCOUNTER — Other Ambulatory Visit: Payer: Self-pay

## 2017-08-21 VITALS — BP 136/78 | HR 68 | Temp 97.7°F | Resp 18 | Ht 67.0 in | Wt 219.1 lb

## 2017-08-21 DIAGNOSIS — L02411 Cutaneous abscess of right axilla: Secondary | ICD-10-CM | POA: Diagnosis not present

## 2017-08-21 DIAGNOSIS — I493 Ventricular premature depolarization: Secondary | ICD-10-CM | POA: Diagnosis not present

## 2017-08-21 MED ORDER — METOPROLOL TARTRATE 25 MG PO TABS: ORAL_TABLET | ORAL | 0 refills | Status: AC

## 2017-08-21 NOTE — Progress Notes (Signed)
Chief Complaint  Patient presents with  . Wound Check   Patient has had a knot under her right arm for some time.  It grew very large and painful and then ruptured.  She was seen for this last week.  Since it was already draining I did not have to do an I&D.  I placed her on Septra DS 1 p.o. twice daily for 5 days, and told her to use warm compresses.  She is here today for follow-up.  Reports that it is much improved.  Patient Active Problem List   Diagnosis Date Noted  . History of DVT of lower extremity 07/12/2016  . HLD (hyperlipidemia) 10/28/2015  . Encounter for long-term (current) use of high-risk medication 07/29/2015  . Heel pain, bilateral 05/20/2015  . Acquired bilateral flat feet 05/20/2015  . Trochanteric bursitis of both hips 05/20/2015  . Tenderness of right calf 05/20/2015  . Depression 08/17/2014  . 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 Constipation - functional 05/13/2013  . GERD (gastroesophageal reflux disease)   . 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 Blood clotting tendency ([place])   . Unspecified vitamin D deficiency 12/24/2012  . Obesity 12/24/2012  . Essential hypertension 10/17/2012    Outpatient Encounter Medications as of 08/13/2017  Medication Sig  . amLODipine (NORVASC) 10 MG tablet Take 1 tablet (10 mg total) by mouth daily.  Marland Kitchen docusate sodium (COLACE) 100 MG capsule Take 100 mg by mouth daily as needed for mild constipation.  . hydrochlorothiazide (HYDRODIURIL) 25 MG tablet Take 1 tablet (25 mg total) by mouth daily.  Marland Kitchen losartan (COZAAR) 100 MG tablet Take 1 tablet (100 mg total) by mouth daily.  . metaxalone (SKELAXIN) 400 MG tablet Take 1 tablet (400 mg total) by mouth 3 (three) times daily. As needed muscle pain  . metoprolol tartrate (LOPRESSOR) 25 MG tablet TAKE 1/2 (ONE-HALF) TABLET BY MOUTH TWICE DAILY  . Multiple Vitamins-Minerals (CENTRUM SILVER PO) Take 1 tablet by mouth every other day.  . pravastatin (PRAVACHOL) 40 MG  tablet Take 1 tablet (40 mg total) by mouth every evening.  . warfarin (COUMADIN) 5 MG tablet TAKE ONE TO ONE AND ONE-HALF TABLETS BY MOUTH ONCE DAILY AS DIRECTED  . sulfamethoxazole-trimethoprim (BACTRIM DS,SEPTRA DS) 800-160 MG tablet Take 1 tablet by mouth 2 (two) times daily.   No facility-administered encounter medications on file as of 08/13/2017.     Allergies  Allergen Reactions  . Atorvastatin Other (See Comments)    Myalgias   . Crestor [Rosuvastatin Calcium] Other (See Comments)    Myalgias / right sided weakness   . Tetracyclines & Related     Nausea, pain    Review of Systems  Constitutional: Negative for activity change, appetite change, chills, fatigue and fever.  HENT: Negative for congestion and dental problem.   Skin: Positive for color change and wound.  Neurological: Negative for dizziness and headaches.  Psychiatric/Behavioral: Negative for sleep disturbance. The patient is not nervous/anxious.   All other systems reviewed and are negative.   BP 136/78 (BP Location: Left Arm, Patient Position: Sitting, Cuff Size: Large)   Pulse 68   Temp 97.7 F (36.5 C) (Temporal)   Resp 18   Ht 5\' 7"  (1.702 m)   Wt 219 lb 1.9 oz (99.4 kg)   SpO2 99%   BMI 34.32 kg/m   Physical Exam  Constitutional: She is oriented to person, place, and time. She appears well-developed and well-nourished. No distress.  HENT:  Head: Normocephalic and atraumatic.  Mouth/Throat: Oropharynx is clear and moist.  Cardiovascular: Normal rate, regular rhythm and normal heart sounds.  Pulmonary/Chest: Effort normal and breath sounds normal.  Abdominal: Soft. Bowel sounds are normal.  Neurological: She is alert and oriented to person, place, and time.  Skin: Skin is warm and dry. There is erythema.  Right axilla examined.  The wound is much smaller in appearance.  The induration is only palpable approximately 2 cm across.  There is proud flesh approximately 1 cm across protruding from the  opening.  This is treated with silver nitrate.  Patient tolerated well.  Psychiatric: She has a normal mood and affect. Her behavior is normal. Thought content normal.    ASSESSMENT/PLAN:  1. Cutaneous abscess of right axilla Ruptured.  I suspect this was a sebaceous cyst that is been present for some time and became secondarily infected.  It has spontaneously drained.  It is largely resolved with 5 days of antibiotics and warm compresses.    Patient Instructions  return as  needed   Raylene Everts, MD

## 2017-08-21 NOTE — Addendum Note (Signed)
Addended by: Lysle Morales on: 08/21/2017 05:09 PM   Modules accepted: Level of Service

## 2017-08-21 NOTE — Patient Instructions (Signed)
return as needed.

## 2017-08-30 ENCOUNTER — Telehealth: Payer: Self-pay | Admitting: Family Medicine

## 2017-08-30 DIAGNOSIS — Z7901 Long term (current) use of anticoagulants: Secondary | ICD-10-CM

## 2017-08-31 ENCOUNTER — Telehealth: Payer: Self-pay | Admitting: Family Medicine

## 2017-08-31 NOTE — Telephone Encounter (Signed)
PT LVM that she would like a referral to the [facility]

## 2017-09-05 ENCOUNTER — Telehealth: Payer: Self-pay

## 2017-09-05 ENCOUNTER — Telehealth: Payer: Self-pay | Admitting: Family Medicine

## 2017-09-05 NOTE — Telephone Encounter (Signed)
, °

## 2017-09-05 NOTE — Telephone Encounter (Signed)
Pt is calling back with name of who she wants to see.

## 2017-09-05 NOTE — Telephone Encounter (Signed)
Pt is still looking for a referral, Has not received a call back from anyone--Please call and advise

## 2017-09-05 NOTE — Telephone Encounter (Signed)
Pt returned your call.  The doctors name is Isaias Cowman MD in [place] [phone]. She said that is the information you needed.

## 2017-09-10 ENCOUNTER — Other Ambulatory Visit: Payer: Self-pay

## 2017-09-10 DIAGNOSIS — Z7901 Long term (current) use of anticoagulants: Secondary | ICD-10-CM

## 2017-09-10 MED ORDER — WARFARIN SODIUM 5 MG PO TABS: 5.0000 mg | ORAL_TABLET | Freq: Every day | ORAL | 0 refills | Status: DC

## 2017-09-10 NOTE — Telephone Encounter (Signed)
Absolutely.  I ordered INR and will auth the refill.

## 2017-09-10 NOTE — Telephone Encounter (Signed)
Please call Renee Pratt and confirm the reason for Dr Harl Bowie information - is this who she wants to see for coumadin management?

## 2017-09-10 NOTE — Telephone Encounter (Signed)
Called and spoke with patient, note sent to [facility] regarding the patient INR testing.

## 2017-09-10 NOTE — Telephone Encounter (Signed)
Submitted

## 2017-09-10 NOTE — Telephone Encounter (Signed)
Patient was called regarding the information for Dr.Branch, she has an appointment to see them 10/08/17, but she is going to run out of her Coumadin before the appointment with Dr.Branch. Last INR was 08/10/17, patient is able to come today to check for INR as a one time instance in hopes to get her medication until she can see Dr.Branch. INR pended, thanks!

## 2017-09-12 DIAGNOSIS — E7800 Pure hypercholesterolemia, unspecified: Secondary | ICD-10-CM | POA: Diagnosis not present

## 2017-09-12 DIAGNOSIS — H00029 Hordeolum internum unspecified eye, unspecified eyelid: Secondary | ICD-10-CM | POA: Diagnosis not present

## 2017-09-12 DIAGNOSIS — H52223 Regular astigmatism, bilateral: Secondary | ICD-10-CM | POA: Diagnosis not present

## 2017-09-12 DIAGNOSIS — I10 Essential (primary) hypertension: Secondary | ICD-10-CM | POA: Diagnosis not present

## 2017-09-12 DIAGNOSIS — H5203 Hypermetropia, bilateral: Secondary | ICD-10-CM | POA: Diagnosis not present

## 2017-09-12 DIAGNOSIS — H2513 Age-related nuclear cataract, bilateral: Secondary | ICD-10-CM | POA: Diagnosis not present

## 2017-09-12 DIAGNOSIS — Z7901 Long term (current) use of anticoagulants: Secondary | ICD-10-CM | POA: Diagnosis not present

## 2017-09-12 LAB — URINALYSIS, ROUTINE W REFLEX MICROSCOPIC
BACTERIA UA: NONE SEEN /HPF
Bilirubin Urine: NEGATIVE
Glucose, UA: NEGATIVE
HGB URINE DIPSTICK: NEGATIVE
HYALINE CAST: NONE SEEN /LPF
Ketones, ur: NEGATIVE
Nitrite: NEGATIVE
PH: 6 (ref 5.0–8.0)
Protein, ur: NEGATIVE
RBC / HPF: NONE SEEN /HPF (ref 0–2)
SPECIFIC GRAVITY, URINE: 1.009 (ref 1.001–1.03)
Squamous Epithelial / LPF: NONE SEEN /HPF (ref <=5)

## 2017-09-12 LAB — COMPLETE METABOLIC PANEL WITH GFR
AG Ratio: 1.5 (calc) (ref 1.0–2.5)
ALKALINE PHOSPHATASE (APISO): 69 U/L (ref 33–130)
ALT: 11 U/L (ref 6–29)
AST: 12 U/L (ref 10–35)
Albumin: 4.1 g/dL (ref 3.6–5.1)
BUN/Creatinine Ratio: 14 (calc) (ref 6–22)
BUN: 14 mg/dL (ref 7–25)
CALCIUM: 9.3 mg/dL (ref 8.6–10.4)
CO2: 33 mmol/L — AB (ref 20–32)
Chloride: 102 mmol/L (ref 98–110)
Creat: 0.99 mg/dL — ABNORMAL HIGH (ref 0.60–0.93)
GFR, EST NON AFRICAN AMERICAN: 58 mL/min/{1.73_m2} — AB (ref >=60)
GFR, Est African American: 67 mL/min/{1.73_m2} (ref >=60)
GLUCOSE: 103 mg/dL — AB (ref 65–99)
Globulin: 2.8 g/dL (calc) (ref 1.9–3.7)
Potassium: 3.8 mmol/L (ref 3.5–5.3)
Sodium: 141 mmol/L (ref 135–146)
Total Bilirubin: 0.4 mg/dL (ref 0.2–1.2)
Total Protein: 6.9 g/dL (ref 6.1–8.1)

## 2017-09-12 LAB — CBC
HEMATOCRIT: 35.5 % (ref 35.0–45.0)
Hemoglobin: 12 g/dL (ref 11.7–15.5)
MCH: 29 pg (ref 27.0–33.0)
MCHC: 33.8 g/dL (ref 32.0–36.0)
MCV: 85.7 fL (ref 80.0–100.0)
MPV: 10.4 fL (ref 7.5–12.5)
Platelets: 198 10*3/uL (ref 140–400)
RBC: 4.14 10*6/uL (ref 3.80–5.10)
RDW: 14.4 % (ref 11.0–15.0)
WBC: 3.6 10*3/uL — AB (ref 3.8–10.8)

## 2017-09-12 LAB — LIPID PANEL
CHOL/HDL RATIO: 5.6 (calc) — AB (ref <5.0)
CHOLESTEROL: 282 mg/dL — AB (ref <200)
HDL: 50 mg/dL — AB (ref >50)
LDL Cholesterol (Calc): 210 mg/dL (calc) — ABNORMAL HIGH
NON-HDL CHOLESTEROL (CALC): 232 mg/dL — AB (ref <130)
TRIGLYCERIDES: 96 mg/dL (ref <150)

## 2017-09-12 LAB — PROTIME-INR
INR: 2.2 — ABNORMAL HIGH
Prothrombin Time: 23 s — ABNORMAL HIGH (ref 9.0–11.5)

## 2017-09-17 ENCOUNTER — Encounter: Payer: Self-pay | Admitting: Family Medicine

## 2017-09-19 ENCOUNTER — Telehealth: Payer: Self-pay

## 2017-09-19 NOTE — Telephone Encounter (Signed)
Please advise, patient called and stated that she wanted to results from her lab work. I looked at them and seen they were completed but wanted to check with you on if you would like to tell her any information, she is called specifically for the PT/INR results.  Thank you.

## 2017-09-28 ENCOUNTER — Telehealth: Payer: Self-pay | Admitting: Family Medicine

## 2017-09-28 DIAGNOSIS — Z7901 Long term (current) use of anticoagulants: Secondary | ICD-10-CM

## 2017-09-28 MED ORDER — WARFARIN SODIUM 5 MG PO TABS: 5.0000 mg | ORAL_TABLET | Freq: Every day | ORAL | 0 refills | Status: DC

## 2017-09-28 NOTE — Telephone Encounter (Signed)
Patient is requesting refill of warfarin 5mg , she has 5 left. [facility] in [place]

## 2017-10-08 ENCOUNTER — Encounter: Payer: Self-pay | Admitting: Cardiology

## 2017-10-08 ENCOUNTER — Ambulatory Visit: Payer: Medicare Other | Admitting: Cardiology

## 2017-10-08 VITALS — BP 140/74 | HR 75 | Ht 67.0 in | Wt 223.0 lb

## 2017-10-08 DIAGNOSIS — E782 Mixed hyperlipidemia: Secondary | ICD-10-CM | POA: Diagnosis not present

## 2017-10-08 DIAGNOSIS — R002 Palpitations: Secondary | ICD-10-CM

## 2017-10-08 DIAGNOSIS — Z7901 Long term (current) use of anticoagulants: Secondary | ICD-10-CM

## 2017-10-08 DIAGNOSIS — Z86718 Personal history of other venous thrombosis and embolism: Secondary | ICD-10-CM

## 2017-10-08 DIAGNOSIS — I10 Essential (primary) hypertension: Secondary | ICD-10-CM | POA: Diagnosis not present

## 2017-10-08 MED ORDER — LISINOPRIL 40 MG PO TABS: 40.0000 mg | ORAL_TABLET | Freq: Every day | ORAL | 3 refills | Status: DC

## 2017-10-08 MED ORDER — RIVAROXABAN 20 MG PO TABS: 20.0000 mg | ORAL_TABLET | Freq: Every day | ORAL | 3 refills | Status: DC

## 2017-10-08 NOTE — Patient Instructions (Signed)
Medication Instructions:  STOP COUMADIN STOP LOSARTAN   START LISINOPRIL 40 MG DAILY  START XARELTO 20 MG DAILY (AFTER STOPPING COUMADIN 3DAYS)  Labwork: NONE  Testing/Procedures: NONE  Follow-Up: Your physician wants you to follow-up in: 6 MONTHS .  You will receive a reminder letter in the mail two months in advance. If you don't receive a letter, please call our office to schedule the follow-up appointment.   Any Other Special Instructions Will Be Listed Below (If Applicable).     If you need a refill on your cardiac medications before your next appointment, please call your pharmacy.

## 2017-10-08 NOTE — Progress Notes (Signed)
Clinical Summary Renee Pratt is a 72 y.o.female seen for follow up of the following medical problems. Patient last seen by Dr Percival Spanish in 2017.   1. HTN - bp's at home 120s/70s - compliant with meds    2. History of DVT - notes indicate recurrent DVTs in 2011, 2012, 2014.  - has been on anticoagulation - notes mention prior IVC filter placed. Venatech Vena cava filter 01/2011 - previously seen by hematologty, 05/2013 recs indefinite anticoagulation - she reports on eliquis in the past, too expensive. Has been on coumadin.    3. Palpitations - recs for continued monitoring after her last 2017 cardiology appt - a 24 hr holter was ordered by pcp 02/2017 shows SR, rare PACs and PVCs.  - improved symptoms with beta blocker.   4. Hyperlipidemia - reports muscle aches on statins. Some symptoms on pravastatin as well, takes 4 days a week only.   SH: moved from [place] Past Medical History:  Diagnosis Date  . Allergy   . 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 Cataract   . Clotting disorder ([place])   . DDD (degenerative disc disease), lumbar   . Depression   . 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 First time 2011, another in 2012, 03/2013  . GERD (gastroesophageal reflux disease)    hiatal hernia  . 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 Hyperlipidemia   . Hypertension   . Uterine fibroid   . Vaginal Pap smear, abnormal      Allergies  Allergen Reactions  . Atorvastatin Other (See Comments)    Myalgias   . Crestor [Rosuvastatin Calcium] Other (See Comments)    Myalgias / right sided weakness   . Tetracyclines & Related     Nausea, pain     Current Outpatient Medications  Medication Sig Dispense Refill  . amLODipine (NORVASC) 10 MG tablet Take 1 tablet (10 mg total) by mouth daily. 90 tablet 3  . docusate sodium (COLACE) 100 MG capsule Take 100 mg by mouth daily as needed for mild constipation.    .  hydrochlorothiazide (HYDRODIURIL) 25 MG tablet Take 1 tablet (25 mg total) by mouth daily. 90 tablet 3  . losartan (COZAAR) 100 MG tablet Take 1 tablet (100 mg total) by mouth daily. 90 tablet 0  . metaxalone (SKELAXIN) 400 MG tablet Take 1 tablet (400 mg total) by mouth 3 (three) times daily. As needed muscle pain 30 tablet 0  . metoprolol tartrate (LOPRESSOR) 25 MG tablet TAKE 1/2 (ONE-HALF) TABLET BY MOUTH TWICE DAILY 90 tablet 0  . Multiple Vitamins-Minerals (CENTRUM SILVER PO) Take 1 tablet by mouth every other day.    . pravastatin (PRAVACHOL) 40 MG tablet Take 1 tablet (40 mg total) by mouth every evening. 90 tablet 0  . warfarin (COUMADIN) 5 MG tablet Take 1 tablet (5 mg total) by mouth daily at 6 PM. 30 tablet 0   No current facility-administered medications for this visit.      Past Surgical History:  Procedure Laterality Date  . CERVICAL BIOPSY    . COLPOSCOPY    . INSERTION OF VENA CAVA FILTER  01-17-2011   dr pindr # [phone]  [place]     Allergies  Allergen Reactions  . Atorvastatin Other (See Comments)    Myalgias   . Crestor [Rosuvastatin Calcium] Other (See Comments)    Myalgias / right  sided weakness   . Tetracyclines & Related     Nausea, pain      Family History  Problem Relation Age of Onset  . Kidney disease Mother   . Hypertension Mother   . Stroke Mother 33  . AAA (abdominal aortic aneurysm) Mother   . Arthritis Mother   . Cancer Father 63       lung cancer  . Hypertension Father   . COPD Father   . Depression Maternal Grandmother   . Depression Son   . Other Cousin        died fo pulmonary embolus  . Colon cancer Cousin   . Clotting disorder Neg Hx   . Colon polyps Neg Hx      Social History Renee Pratt reports that she quit smoking about 19 years ago. Her smoking use included cigarettes. She has a 5.00 pack-year smoking history. She has never used smokeless tobacco. Renee Pratt reports that she drinks alcohol.   Review of  Systems CONSTITUTIONAL: No weight loss, fever, chills, weakness or fatigue.  HEENT: Eyes: No visual loss, blurred vision, double vision or yellow sclerae.No hearing loss, sneezing, congestion, runny nose or sore throat.  SKIN: No rash or itching.  CARDIOVASCULAR: per hpi RESPIRATORY: No shortness of breath, cough or sputum.  GASTROINTESTINAL: No anorexia, nausea, vomiting or diarrhea. No abdominal pain or blood.  GENITOURINARY: No burning on urination, no polyuria NEUROLOGICAL: No headache, dizziness, syncope, paralysis, ataxia, numbness or tingling in the extremities. No change in bowel or bladder control.  MUSCULOSKELETAL: No muscle, back pain, joint pain or stiffness.  LYMPHATICS: No enlarged nodes. No history of splenectomy.  PSYCHIATRIC: No history of depression or anxiety.  ENDOCRINOLOGIC: No reports of sweating, cold or heat intolerance. No polyuria or polydipsia.  Marland Kitchen   Physical Examination Vitals:   10/08/17 1335 10/08/17 1337  BP: (!) 148/74 140/74  Pulse: 75   SpO2: 95%    Vitals:   10/08/17 1335  Weight: 223 lb (101.2 kg)  Height: 5\' 7"  (1.702 m)    Gen: resting comfortably, no acute distress HEENT: no scleral icterus, pupils equal round and reactive, no palptable cervical adenopathy,  CV: RRR, no m/r/g, no jvd Resp: Clear to auscultation bilaterally GI: abdomen is soft, non-tender, non-distended, normal bowel sounds, no hepatosplenomegaly MSK: extremities are warm, no edema.  Skin: warm, no rash Neuro:  no focal deficits Psych: appropriate affect     Assessment and Plan  1. Recurrent DVT - lifelong anticoagulation - she is interested in changing from coumadin to DOAC. W eill stop coumadin x 3 days, then start xarelto 20mg  daily  2. HTN - at goal based on home numbers - requests to change from losartan though her Rx was not part of recall. We will d/c losartan, start lisinopril 40mg  daily  3. Palpitations - prior holter with benign ectopy - continue beta  blocker, has improved symtoms  4. Hyperlipidemia - continue pravastatin for now, not exactly clear her symptoms are statin related. She will increase compliance and monitor for any increase in symptoms - could consider zetia or pcsk9 inhibitor if needed   F/u 6 months      Arnoldo Lenis, M.D.

## 2017-10-10 ENCOUNTER — Encounter: Payer: Self-pay | Admitting: Cardiology

## 2017-10-12 ENCOUNTER — Telehealth: Payer: Self-pay | Admitting: Cardiology

## 2017-10-12 NOTE — Telephone Encounter (Signed)
Patient states she was prescribed Xarelto at [place] but is unable to afford. / tg

## 2017-10-15 NOTE — Telephone Encounter (Signed)
Patient assistance completed. Awaiting MD to sign.

## 2017-10-16 ENCOUNTER — Telehealth: Payer: Self-pay | Admitting: *Deleted

## 2017-10-16 NOTE — Telephone Encounter (Signed)
Patient assistance forms faxed to [employer]

## 2017-12-04 ENCOUNTER — Telehealth: Payer: Self-pay | Admitting: Cardiology

## 2017-12-04 NOTE — Telephone Encounter (Signed)
Called to check on her assistance for her  Xarelto, she hasn't heard anything and she's almost out of samples

## 2017-12-05 NOTE — Telephone Encounter (Signed)
Spoke with Xarelto pt assist. Who states that pts application on file is an older version. D/t being an older version they are unable to process application. Pt will stop by office for samples of Xarelto and to complete new application on tomorrow.

## 2017-12-06 DIAGNOSIS — H2513 Age-related nuclear cataract, bilateral: Secondary | ICD-10-CM | POA: Diagnosis not present

## 2017-12-06 DIAGNOSIS — H40023 Open angle with borderline findings, high risk, bilateral: Secondary | ICD-10-CM | POA: Diagnosis not present

## 2017-12-31 DIAGNOSIS — E785 Hyperlipidemia, unspecified: Secondary | ICD-10-CM | POA: Diagnosis not present

## 2017-12-31 DIAGNOSIS — D689 Coagulation defect, unspecified: Secondary | ICD-10-CM | POA: Diagnosis not present

## 2017-12-31 DIAGNOSIS — I10 Essential (primary) hypertension: Secondary | ICD-10-CM | POA: Diagnosis not present

## 2018-01-09 ENCOUNTER — Telehealth: Payer: Self-pay | Admitting: Cardiology

## 2018-01-09 NOTE — Telephone Encounter (Signed)
Asking about status on medication assistance

## 2018-01-15 NOTE — Telephone Encounter (Signed)
Spoke with Renee Pratt at [place] patient assistance. She stated that pages 2, 4 needed to be completed again. She states that the tax forms that have been sent may be used. Pt to come by office today and resign assistance forms.   2 Sample bottles of Xarelto placed at front for pt pick up.

## 2018-01-22 ENCOUNTER — Encounter: Payer: Self-pay | Admitting: Cardiology

## 2018-01-22 ENCOUNTER — Ambulatory Visit: Payer: Medicare Other | Admitting: Cardiology

## 2018-01-22 VITALS — BP 162/78 | HR 69 | Ht 67.0 in | Wt 219.0 lb

## 2018-01-22 DIAGNOSIS — I10 Essential (primary) hypertension: Secondary | ICD-10-CM | POA: Diagnosis not present

## 2018-01-22 MED ORDER — AMLODIPINE BESYLATE 10 MG PO TABS: 10.0000 mg | ORAL_TABLET | Freq: Every day | ORAL | 3 refills | Status: DC

## 2018-01-22 NOTE — Patient Instructions (Signed)
Medication Instructions:  RESTART AMLODIPINE 10 MG DAILY  Labwork: NONE  Testing/Procedures: NONE  Follow-Up: Your physician wants you to follow-up in: 6 MONTHS.  You will receive a reminder letter in the mail two months in advance. If you don't receive a letter, please call our office to schedule the follow-up appointment.   Any Other Special Instructions Will Be Listed Below (If Applicable).     If you need a refill on your cardiac medications before your next appointment, please call your pharmacy.

## 2018-01-22 NOTE — Progress Notes (Signed)
Clinical Summary Ms. Quiroa is a 72 y.o.female seen for follow up of the following medical problems. Patient last seen by Dr Percival Spanish in 2017. This is a focused visit on her history of HTN.    1. HTN - currently taking lisino[ri 40, HCTZ 15, lopressor 12.5mg  bid - compliant with meds - nonspecific neck pains at times, worst with position. She thought may be related to medications but by history and timining of starting meds appears unlikely.     SH: moved from [place]    Past Medical History:  Diagnosis Date  . Allergy   . 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 Cataract   . Clotting disorder ([place])   . DDD (degenerative disc disease), lumbar   . Depression   . 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 First time 2011, another in 2012, 03/2013  . GERD (gastroesophageal reflux disease)    hiatal hernia  . 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 Hyperlipidemia   . Hypertension   . Uterine fibroid   . Vaginal Pap smear, abnormal      Allergies  Allergen Reactions  . Atorvastatin Other (See Comments)    Myalgias   . Crestor [Rosuvastatin Calcium] Other (See Comments)    Myalgias / right sided weakness   . Tetracyclines & Related     Nausea, pain     Current Outpatient Medications  Medication Sig Dispense Refill  . amLODipine (NORVASC) 10 MG tablet Take 1 tablet (10 mg total) by mouth daily. 90 tablet 3  . docusate sodium (COLACE) 100 MG capsule Take 100 mg by mouth daily as needed for mild constipation.    . hydrochlorothiazide (HYDRODIURIL) 25 MG tablet Take 1 tablet (25 mg total) by mouth daily. 90 tablet 3  . lisinopril (PRINIVIL,ZESTRIL) 40 MG tablet Take 1 tablet (40 mg total) by mouth daily. 90 tablet 3  . metaxalone (SKELAXIN) 400 MG tablet Take 1 tablet (400 mg total) by mouth 3 (three) times daily. As needed muscle pain 30 tablet 0  . metoprolol tartrate (LOPRESSOR) 25 MG tablet TAKE 1/2  (ONE-HALF) TABLET BY MOUTH TWICE DAILY 90 tablet 0  . Multiple Vitamins-Minerals (CENTRUM SILVER PO) Take 1 tablet by mouth every other day.    . pravastatin (PRAVACHOL) 40 MG tablet Take 1 tablet (40 mg total) by mouth every evening. 90 tablet 0  . rivaroxaban (XARELTO) 20 MG TABS tablet Take 1 tablet (20 mg total) by mouth daily with supper. 90 tablet 3   No current facility-administered medications for this visit.      Past Surgical History:  Procedure Laterality Date  . CERVICAL BIOPSY    . COLPOSCOPY    . INSERTION OF VENA CAVA FILTER  01-17-2011   dr pindr # [phone]  [place]     Allergies  Allergen Reactions  . Atorvastatin Other (See Comments)    Myalgias   . Crestor [Rosuvastatin Calcium] Other (See Comments)    Myalgias / right sided weakness   . Tetracyclines & Related     Nausea, pain      Family History  Problem Relation Age of Onset  . Kidney disease Mother   . Hypertension Mother   . Stroke Mother 21  . AAA (abdominal aortic aneurysm) Mother   . Arthritis Mother   . Cancer Father 40       lung cancer  .  Hypertension Father   . COPD Father   . Depression Maternal Grandmother   . Depression Son   . Other Cousin        died fo pulmonary embolus  . Colon cancer Cousin   . Clotting disorder Neg Hx   . Colon polyps Neg Hx      Social History Ms. Tackitt reports that she quit smoking about 32 years ago. Her smoking use included cigarettes. She has a 5.00 pack-year smoking history. She has never used smokeless tobacco. Ms. Wester reports that she drinks alcohol.   Review of Systems CONSTITUTIONAL: No weight loss, fever, chills, weakness or fatigue.  HEENT: Eyes: No visual loss, blurred vision, double vision or yellow sclerae.No hearing loss, sneezing, congestion, runny nose or sore throat.  SKIN: No rash or itching.  CARDIOVASCULAR: per hpi RESPIRATORY: No shortness of breath, cough or sputum.  GASTROINTESTINAL: No anorexia, nausea, vomiting or  diarrhea. No abdominal pain or blood.  GENITOURINARY: No burning on urination, no polyuria NEUROLOGICAL: No headache, dizziness, syncope, paralysis, ataxia, numbness or tingling in the extremities. No change in bowel or bladder control.  MUSCULOSKELETAL: per hpi LYMPHATICS: No enlarged nodes. No history of splenectomy.  PSYCHIATRIC: No history of depression or anxiety.  ENDOCRINOLOGIC: No reports of sweating, cold or heat intolerance. No polyuria or polydipsia.  Marland Kitchen   Physical Examination Vitals:   01/22/18 1050  BP: (!) 162/78  Pulse: 69  SpO2: 98%   Vitals:   01/22/18 1050  Weight: 219 lb (99.3 kg)  Height: 5\' 7"  (1.702 m)    Gen: resting comfortably, no acute distress HEENT: no scleral icterus, pupils equal round and reactive, no palptable cervical adenopathy,  CV: RRR, no m/r/g, no jvd Resp: Clear to auscultation bilaterally GI: abdomen is soft, non-tender, non-distended, normal bowel sounds, no hepatosplenomegaly MSK: extremities are warm, no edema.  Skin: warm, no rash Neuro:  no focal deficits Psych: appropriate affect   Diagnostic Studies     Assessment and Plan   1. HTN - above goal She suspected possible sideffects on norvasc in the past but timing does not match up with her reported symptoms, and neck pain not a typical side effect - restart norvasc 10mg  daily.      Arnoldo Lenis, M.D.

## 2018-01-28 ENCOUNTER — Telehealth: Payer: Self-pay | Admitting: *Deleted

## 2018-01-28 NOTE — Telephone Encounter (Signed)
Called to check the states of Xarelto Pt assistance. Spoke with [place] who states that the application is still in processing.

## 2018-02-03 ENCOUNTER — Encounter: Payer: Self-pay | Admitting: Cardiology

## 2018-02-12 ENCOUNTER — Telehealth: Payer: Self-pay | Admitting: *Deleted

## 2018-02-12 DIAGNOSIS — D689 Coagulation defect, unspecified: Secondary | ICD-10-CM

## 2018-02-12 DIAGNOSIS — I82401 Acute embolism and thrombosis of unspecified deep veins of right lower extremity: Secondary | ICD-10-CM

## 2018-02-12 MED ORDER — RIVAROXABAN 20 MG PO TABS: 20.0000 mg | ORAL_TABLET | Freq: Every day | ORAL | 0 refills | Status: DC

## 2018-02-12 NOTE — Telephone Encounter (Signed)
Patient called to inform office that she was denied patient assistance for xarelto due to lacking $800 of out of pocket expenseive and wanted to go back on coumadin. Patient currently has one xarelto pill left. Two week worth samples of xarelto 20 mg given to patient. Please advise on coumadin dose, directions. Patient will be enrolled into the coumadin clinic.

## 2018-02-12 NOTE — Telephone Encounter (Signed)
Ok to change to coumadin, dosing will be based on her first visit with coumadin clinic. Please have her seen within the next week or 2. If not extend her xarelto samples   Zandra Abts MD

## 2018-02-12 NOTE — Telephone Encounter (Signed)
Patient informed and an additional 14 tablets of xarelto samples added to bag. UAU#[id number] exp-12/2019

## 2018-03-04 ENCOUNTER — Ambulatory Visit (INDEPENDENT_AMBULATORY_CARE_PROVIDER_SITE_OTHER): Payer: Medicare Other | Admitting: *Deleted

## 2018-03-04 DIAGNOSIS — Z5181 Encounter for therapeutic drug level monitoring: Secondary | ICD-10-CM | POA: Diagnosis not present

## 2018-03-04 DIAGNOSIS — D689 Coagulation defect, unspecified: Secondary | ICD-10-CM

## 2018-03-04 DIAGNOSIS — I4891 Unspecified atrial fibrillation: Secondary | ICD-10-CM

## 2018-03-04 DIAGNOSIS — I82401 Acute embolism and thrombosis of unspecified deep veins of right lower extremity: Secondary | ICD-10-CM | POA: Diagnosis not present

## 2018-03-04 LAB — POCT INR: INR: 1.4 — AB (ref 2.0–3.0)

## 2018-03-04 MED ORDER — WARFARIN SODIUM 5 MG PO TABS: ORAL_TABLET | ORAL | 3 refills | Status: DC

## 2018-03-04 NOTE — Patient Instructions (Signed)
Continue Xarelto 20mg  daily.  Take last dose of Xarelto on 9/5.  Start coumadin 5mg  daily except 7.5mg  on Mondays and Fridays starting on 9/3.  You will take both Xarelto and coumadin on 9/3, 9/4, 9/5 Recheck in [place] office in 1 week

## 2018-03-19 ENCOUNTER — Ambulatory Visit (INDEPENDENT_AMBULATORY_CARE_PROVIDER_SITE_OTHER): Payer: Medicare Other | Admitting: *Deleted

## 2018-03-19 DIAGNOSIS — Z5181 Encounter for therapeutic drug level monitoring: Secondary | ICD-10-CM | POA: Diagnosis not present

## 2018-03-19 DIAGNOSIS — I4891 Unspecified atrial fibrillation: Secondary | ICD-10-CM

## 2018-03-19 LAB — POCT INR: INR: 1.2 — AB (ref 2.0–3.0)

## 2018-03-19 NOTE — Patient Instructions (Signed)
Take coumadin 1 1/2 tablets tonight and tomorrow night then resume 1 tablet daily except 1 1/2 tablets on Mondays and Fridays Recheck in 1 week

## 2018-03-27 ENCOUNTER — Ambulatory Visit (INDEPENDENT_AMBULATORY_CARE_PROVIDER_SITE_OTHER): Payer: Medicare Other | Admitting: *Deleted

## 2018-03-27 DIAGNOSIS — Z5181 Encounter for therapeutic drug level monitoring: Secondary | ICD-10-CM

## 2018-03-27 DIAGNOSIS — I4891 Unspecified atrial fibrillation: Secondary | ICD-10-CM | POA: Diagnosis not present

## 2018-03-27 LAB — POCT INR: INR: 2.5 (ref 2.0–3.0)

## 2018-03-27 NOTE — Patient Instructions (Signed)
Continue coumadin 1 tablet daily except 1 1/2 tablets on Mondays and Fridays Recheck in 1 week

## 2018-04-09 ENCOUNTER — Ambulatory Visit (INDEPENDENT_AMBULATORY_CARE_PROVIDER_SITE_OTHER): Payer: Medicare Other | Admitting: *Deleted

## 2018-04-09 DIAGNOSIS — Z5181 Encounter for therapeutic drug level monitoring: Secondary | ICD-10-CM

## 2018-04-09 DIAGNOSIS — I4891 Unspecified atrial fibrillation: Secondary | ICD-10-CM | POA: Diagnosis not present

## 2018-04-09 DIAGNOSIS — I82401 Acute embolism and thrombosis of unspecified deep veins of right lower extremity: Secondary | ICD-10-CM | POA: Diagnosis not present

## 2018-04-09 LAB — POCT INR: INR: 2.1 (ref 2.0–3.0)

## 2018-04-09 NOTE — Patient Instructions (Signed)
Continue coumadin 1 tablet daily except 1 1/2 tablets on Mondays and Fridays Recheck in 2 weeks

## 2018-04-25 ENCOUNTER — Ambulatory Visit (INDEPENDENT_AMBULATORY_CARE_PROVIDER_SITE_OTHER): Payer: Medicare Other | Admitting: *Deleted

## 2018-04-25 DIAGNOSIS — I4891 Unspecified atrial fibrillation: Secondary | ICD-10-CM

## 2018-04-25 DIAGNOSIS — I82401 Acute embolism and thrombosis of unspecified deep veins of right lower extremity: Secondary | ICD-10-CM | POA: Diagnosis not present

## 2018-04-25 DIAGNOSIS — Z5181 Encounter for therapeutic drug level monitoring: Secondary | ICD-10-CM

## 2018-04-25 LAB — POCT INR: INR: 2 (ref 2.0–3.0)

## 2018-04-25 NOTE — Patient Instructions (Signed)
Take coumadin 1 1/2 tablets tonight then increase dose to 1 tablet daily except 1 1/2 tablets on Mondays, Wednesdays and Fridays Recheck in 3 weeks

## 2018-05-16 ENCOUNTER — Ambulatory Visit (INDEPENDENT_AMBULATORY_CARE_PROVIDER_SITE_OTHER): Payer: Medicare Other | Admitting: *Deleted

## 2018-05-16 DIAGNOSIS — I82401 Acute embolism and thrombosis of unspecified deep veins of right lower extremity: Secondary | ICD-10-CM

## 2018-05-16 DIAGNOSIS — Z5181 Encounter for therapeutic drug level monitoring: Secondary | ICD-10-CM | POA: Diagnosis not present

## 2018-05-16 DIAGNOSIS — I4891 Unspecified atrial fibrillation: Secondary | ICD-10-CM | POA: Diagnosis not present

## 2018-05-16 LAB — POCT INR: INR: 2.3 (ref 2.0–3.0)

## 2018-05-16 NOTE — Patient Instructions (Signed)
Continue coumadin 1 tablet daily except 1 1/2 tablets on Mondays, Wednesdays and Fridays Recheck in 4 weeks  

## 2018-06-11 ENCOUNTER — Ambulatory Visit (INDEPENDENT_AMBULATORY_CARE_PROVIDER_SITE_OTHER): Payer: Medicare Other | Admitting: *Deleted

## 2018-06-11 DIAGNOSIS — Z5181 Encounter for therapeutic drug level monitoring: Secondary | ICD-10-CM | POA: Diagnosis not present

## 2018-06-11 DIAGNOSIS — I4891 Unspecified atrial fibrillation: Secondary | ICD-10-CM

## 2018-06-11 DIAGNOSIS — I82401 Acute embolism and thrombosis of unspecified deep veins of right lower extremity: Secondary | ICD-10-CM | POA: Diagnosis not present

## 2018-06-11 DIAGNOSIS — Z79899 Other long term (current) drug therapy: Secondary | ICD-10-CM | POA: Diagnosis not present

## 2018-06-11 LAB — POCT INR: INR: 2.9 (ref 2.0–3.0)

## 2018-06-11 NOTE — Patient Instructions (Signed)
Continue coumadin 1 tablet daily except 1 1/2 tablets on Mondays, Wednesdays and Fridays Recheck in 4 weeks  

## 2018-07-11 ENCOUNTER — Ambulatory Visit (INDEPENDENT_AMBULATORY_CARE_PROVIDER_SITE_OTHER): Payer: Medicare Other | Admitting: Pharmacist

## 2018-07-11 DIAGNOSIS — D689 Coagulation defect, unspecified: Secondary | ICD-10-CM

## 2018-07-11 DIAGNOSIS — Z5181 Encounter for therapeutic drug level monitoring: Secondary | ICD-10-CM

## 2018-07-11 DIAGNOSIS — Z79899 Other long term (current) drug therapy: Secondary | ICD-10-CM

## 2018-07-11 DIAGNOSIS — Z86718 Personal history of other venous thrombosis and embolism: Secondary | ICD-10-CM

## 2018-07-11 DIAGNOSIS — I4891 Unspecified atrial fibrillation: Secondary | ICD-10-CM

## 2018-07-11 LAB — POCT INR: INR: 2.9 (ref 2.0–3.0)

## 2018-07-11 NOTE — Patient Instructions (Signed)
Description   Continue coumadin 1 tablet daily except 1 1/2 tablets on Mondays, Wednesdays and Fridays Recheck in 6 weeks

## 2018-07-23 ENCOUNTER — Encounter: Payer: Self-pay | Admitting: Cardiology

## 2018-07-23 ENCOUNTER — Ambulatory Visit: Payer: Medicare Other | Admitting: Cardiology

## 2018-07-23 VITALS — BP 137/75 | HR 73 | Ht 67.0 in | Wt 219.6 lb

## 2018-07-23 DIAGNOSIS — E782 Mixed hyperlipidemia: Secondary | ICD-10-CM | POA: Diagnosis not present

## 2018-07-23 DIAGNOSIS — I10 Essential (primary) hypertension: Secondary | ICD-10-CM | POA: Diagnosis not present

## 2018-07-23 DIAGNOSIS — Z7901 Long term (current) use of anticoagulants: Secondary | ICD-10-CM

## 2018-07-23 DIAGNOSIS — R002 Palpitations: Secondary | ICD-10-CM | POA: Diagnosis not present

## 2018-07-23 DIAGNOSIS — R011 Cardiac murmur, unspecified: Secondary | ICD-10-CM

## 2018-07-23 MED ORDER — SPIRONOLACTONE 25 MG PO TABS: 25.0000 mg | ORAL_TABLET | Freq: Every day | ORAL | 1 refills | Status: DC

## 2018-07-23 NOTE — Progress Notes (Signed)
Clinical Summary Renee Pratt is a 73 y.o.female seen today for follow up of the following medical problems.   1.HTN - cough x 2 months, thinks may be related to lisionpril. Switched from both losartan and valsartan in the past due to separate recalls   2. History of DVT - notes indicate recurrent DVTs in 2011, 2012, 2014.  - has been on anticoagulation - notes mention prior IVC filter placed. Venatech Vena cava filter 01/2011 - previously seen by hematologty, 05/2013 recs indefinite anticoagulation - she reports on eliquis in the past, too expensive. Has been on coumadin.    - DOACs too expensive, restarted coumadin    3. Palpitations - recs for continued monitoring after her last 2017 cardiology appt - a 24 hr holter was ordered by pcp 02/2017 shows SR, rare PACs and PVCs.  - improved symptoms with beta blocker.   - no recent symptoms.   4. Hyperlipidemia - reports muscle aches on statins. Has been on low dose pravastatin 02/2018 TC 252 TG 109 LDL 180 - takes pravastin 20mg  daily. Mixed compliance previously       SH: moved from [place] Past Medical History:  Diagnosis Date  . Allergy   . Blood clotting tendency ([place])   . Cataract   . Clotting disorder ([place])   . DDD (degenerative disc disease), lumbar   . Depression   . 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 First time 2011, another in 2012, 03/2013  . GERD (gastroesophageal reflux disease)    hiatal hernia  . 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 Hyperlipidemia   . Hypertension   . Uterine fibroid   . Vaginal Pap smear, abnormal      Allergies  Allergen Reactions  . Atorvastatin Other (See Comments)    Myalgias   . Crestor [Rosuvastatin Calcium] Other (See Comments)    Myalgias / right sided weakness   . Tetracyclines & Related     Nausea, pain     Current Outpatient Medications  Medication Sig Dispense Refill  .  amLODipine (NORVASC) 10 MG tablet Take 1 tablet (10 mg total) by mouth daily. 90 tablet 3  . docusate sodium (COLACE) 100 MG capsule Take 100 mg by mouth daily as needed for mild constipation.    . hydrochlorothiazide (HYDRODIURIL) 25 MG tablet Take 1 tablet (25 mg total) by mouth daily. 90 tablet 3  . lisinopril (PRINIVIL,ZESTRIL) 40 MG tablet Take 1 tablet (40 mg total) by mouth daily. 90 tablet 3  . metaxalone (SKELAXIN) 400 MG tablet Take 1 tablet (400 mg total) by mouth 3 (three) times daily. As needed muscle pain 30 tablet 0  . metoprolol tartrate (LOPRESSOR) 25 MG tablet TAKE 1/2 (ONE-HALF) TABLET BY MOUTH TWICE DAILY 90 tablet 0  . Multiple Vitamins-Minerals (CENTRUM SILVER PO) Take 1 tablet by mouth every other day.    . pravastatin (PRAVACHOL) 40 MG tablet Take 1 tablet (40 mg total) by mouth every evening. 90 tablet 0  . warfarin (COUMADIN) 5 MG tablet Take 1 tablet daily except 1 1/2 tablets on Mondays and Fridays or as directed 45 tablet 3   No current facility-administered medications for this visit.      Past Surgical History:  Procedure Laterality Date  . CERVICAL BIOPSY    . COLPOSCOPY    . INSERTION OF VENA CAVA FILTER  01-17-2011   dr pindr #  [phone]  [place]     Allergies  Allergen Reactions  . Atorvastatin Other (See Comments)    Myalgias   . Crestor [Rosuvastatin Calcium] Other (See Comments)    Myalgias / right sided weakness   . Tetracyclines & Related     Nausea, pain      Family History  Problem Relation Age of Onset  . Kidney disease Mother   . Hypertension Mother   . Stroke Mother 49  . AAA (abdominal aortic aneurysm) Mother   . Arthritis Mother   . Cancer Father 14       lung cancer  . Hypertension Father   . COPD Father   . Depression Maternal Grandmother   . Depression Son   . Other Cousin        died fo pulmonary embolus  . Colon cancer Cousin   . Clotting disorder Neg Hx   . Colon polyps Neg Hx      Social History [name] reports that she quit smoking about 33 years ago. Her smoking use included cigarettes. She has a 5.00 pack-year smoking history. She has never used smokeless tobacco. Renee Pratt reports current alcohol use.   Review of Systems CONSTITUTIONAL: No weight loss, fever, chills, weakness or fatigue.  HEENT: Eyes: No visual loss, blurred vision, double vision or yellow sclerae.No hearing loss, sneezing, congestion, runny nose or sore throat.  SKIN: No rash or itching.  CARDIOVASCULAR: per hpi RESPIRATORY: No shortness of breath, cough or sputum.  GASTROINTESTINAL: No anorexia, nausea, vomiting or diarrhea. No abdominal pain or blood.  GENITOURINARY: No burning on urination, no polyuria NEUROLOGICAL: No headache, dizziness, syncope, paralysis, ataxia, numbness or tingling in the extremities. No change in bowel or bladder control.  MUSCULOSKELETAL: No muscle, back pain, joint pain or stiffness.  LYMPHATICS: No enlarged nodes. No history of splenectomy.  PSYCHIATRIC: No history of depression or anxiety.  ENDOCRINOLOGIC: No reports of sweating, cold or heat intolerance. No polyuria or polydipsia.  Marland Kitchen   Physical Examination Vitals:   07/23/18 1506  BP: 137/75  Pulse: 73  SpO2: 98%   Vitals:   07/23/18 1506  Weight: 219 lb 9.6 oz (99.6 kg)  Height: 5\' 7"  (1.702 m)    Gen: resting comfortably, no acute distress HEENT: no scleral icterus, pupils equal round and reactive, no palptable cervical adenopathy,  CV: RRR, 2/6 systolic murmur rusb, no jvd Resp: Clear to auscultation bilaterally GI: abdomen is soft, non-tender, non-distended, normal bowel sounds, no hepatosplenomegaly MSK: extremities are warm, no edema.  Skin: warm, no rash Neuro:  no focal deficits Psych: appropriate affect     Assessment and Plan   1. HTN - above goal - recent cough possibly from lisinopril. Recently changed from 2 separate ARBs due to recalls, she is hesitant to try ARBs again. Will start aldactone  25mg  daily, check BMET in 2 weeks.  - bp check at her coumadin clinic appt 08/22/18   2. Recurrent DVT - lifelong anticoagulation - DOACs too expensive, continue coumadin.    3. Palpitations - prior holter with benign ectopy - no symptoms, continue beta blocker.  - EKG today shows normal sinus rhyhtm  4. Hyperlipidemia - previous side effects on statin, taking pravastatin 20mg  daily. Mixed compliance at time of her last lipid panel, she reports increased compliance at this time - repeat lipid panel, consider further titration of pravastatin if needed  5. Heart murmur  - obtain echo    Arnoldo Lenis, M.D.

## 2018-07-23 NOTE — Patient Instructions (Signed)
Your physician wants you to follow-up in: [place] AS NEXT COUMADIN APPOINTMENT You will receive a reminder letter in the mail two months in advance. If you don't receive a letter, please call our office to schedule the follow-up appointment.  Your physician has recommended you make the following change in your medication:   STOP LISINOPRIL   START SPIRONOLACTONE 25 MG DAILY   Your physician recommends that you return for lab work 2 [place] FAST 6-8 [place] physician has requested that you have an echocardiogram. Echocardiography is a painless test that uses sound waves to create images of your heart. It provides your doctor with information about the size and shape of your heart and how well your heart's chambers and valves are working. This procedure takes approximately one hour. There are no restrictions for this procedure.  Thank you for choosing [facility]!!

## 2018-08-08 ENCOUNTER — Ambulatory Visit: Payer: Self-pay | Admitting: Cardiology

## 2018-08-22 ENCOUNTER — Ambulatory Visit (INDEPENDENT_AMBULATORY_CARE_PROVIDER_SITE_OTHER): Payer: Medicare Other | Admitting: *Deleted

## 2018-08-22 ENCOUNTER — Ambulatory Visit (INDEPENDENT_AMBULATORY_CARE_PROVIDER_SITE_OTHER): Payer: Medicare Other | Admitting: Pharmacist

## 2018-08-22 ENCOUNTER — Ambulatory Visit (INDEPENDENT_AMBULATORY_CARE_PROVIDER_SITE_OTHER): Payer: Medicare Other

## 2018-08-22 DIAGNOSIS — I10 Essential (primary) hypertension: Secondary | ICD-10-CM | POA: Diagnosis not present

## 2018-08-22 DIAGNOSIS — R011 Cardiac murmur, unspecified: Secondary | ICD-10-CM | POA: Diagnosis not present

## 2018-08-22 DIAGNOSIS — Z79899 Other long term (current) drug therapy: Secondary | ICD-10-CM

## 2018-08-22 DIAGNOSIS — Z5181 Encounter for therapeutic drug level monitoring: Secondary | ICD-10-CM

## 2018-08-22 DIAGNOSIS — I4891 Unspecified atrial fibrillation: Secondary | ICD-10-CM

## 2018-08-22 DIAGNOSIS — Z86718 Personal history of other venous thrombosis and embolism: Secondary | ICD-10-CM

## 2018-08-22 DIAGNOSIS — D689 Coagulation defect, unspecified: Secondary | ICD-10-CM

## 2018-08-22 LAB — POCT INR: INR: 3.3 — AB (ref 2.0–3.0)

## 2018-08-22 NOTE — Patient Instructions (Signed)
Description   Take a 1/2 tablet tonight then continue coumadin 1 tablet daily except 1 1/2 tablets on Mondays, Wednesdays and Fridays Recheck in 4 weeks

## 2018-08-22 NOTE — Progress Notes (Signed)
BP mildly above goal, we will keep an eye on it for now and see how her labs look  Zandra Abts MD

## 2018-08-22 NOTE — Progress Notes (Signed)
Pt here for vitals check after 1/14 OV - she hasn't gotten labs done yet, says she is not coughing as much since stopping lisinopril - BP today is 138/72 HR 68 - says she has been taking spironolactone and has been having some leg cramps and started eating more bananas and this resolved

## 2018-08-22 NOTE — Progress Notes (Signed)
Pt voiced understanding will be getting labs at pcp on Monday

## 2018-08-26 ENCOUNTER — Other Ambulatory Visit: Payer: Self-pay | Admitting: *Deleted

## 2018-08-26 MED ORDER — HYDROCHLOROTHIAZIDE 25 MG PO TABS: 25.0000 mg | ORAL_TABLET | Freq: Every day | ORAL | 3 refills | Status: AC

## 2018-08-30 ENCOUNTER — Telehealth: Payer: Self-pay | Admitting: *Deleted

## 2018-08-30 NOTE — Telephone Encounter (Signed)
-----   Message from Arnoldo Lenis, MD sent at 08/29/2018  6:48 PM EST ----- Echo looks good, normal heart function   Zandra Abts MD

## 2018-08-30 NOTE — Telephone Encounter (Signed)
Patient informed. Copy sent to PCP °

## 2018-09-02 ENCOUNTER — Other Ambulatory Visit: Payer: Self-pay | Admitting: Cardiology

## 2018-10-11 ENCOUNTER — Telehealth: Payer: Self-pay | Admitting: Cardiology

## 2018-10-11 NOTE — Telephone Encounter (Signed)
° ° ° °  COVID-19 Pre-Screening Questions: ° °• Do you currently have a fever? No °•  °• Have you recently travelled on a cruise, internationally, or to [place], [place], [place], [place], [place], or [place], [place] ([employer]) ? No °•  °• Have you been in contact with someone that is currently pending confirmation of Covid19 testing or has been confirmed to have the Covid19 virus? No °•  °• Are you currently experiencing fatigue or cough? No  ° ° °   ° ° ° ° ° °

## 2018-10-14 ENCOUNTER — Ambulatory Visit (INDEPENDENT_AMBULATORY_CARE_PROVIDER_SITE_OTHER): Payer: Medicare Other | Admitting: *Deleted

## 2018-10-14 ENCOUNTER — Other Ambulatory Visit: Payer: Self-pay

## 2018-10-14 DIAGNOSIS — Z5181 Encounter for therapeutic drug level monitoring: Secondary | ICD-10-CM | POA: Diagnosis not present

## 2018-10-14 DIAGNOSIS — I4891 Unspecified atrial fibrillation: Secondary | ICD-10-CM | POA: Diagnosis not present

## 2018-10-14 LAB — POCT INR: INR: 3.5 — AB (ref 2.0–3.0)

## 2018-10-14 NOTE — Patient Instructions (Signed)
Take a 1/2 tablet tonight then decrease dose to 1 tablet daily except 1 1/2 tablets on Fridays  Recheck in 4 weeks

## 2018-11-08 ENCOUNTER — Telehealth: Payer: Self-pay | Admitting: *Deleted

## 2018-11-08 NOTE — Telephone Encounter (Signed)

## 2018-11-11 ENCOUNTER — Ambulatory Visit (INDEPENDENT_AMBULATORY_CARE_PROVIDER_SITE_OTHER): Payer: Medicare Other | Admitting: *Deleted

## 2018-11-11 ENCOUNTER — Other Ambulatory Visit: Payer: Self-pay

## 2018-11-11 DIAGNOSIS — I4891 Unspecified atrial fibrillation: Secondary | ICD-10-CM | POA: Diagnosis not present

## 2018-11-11 DIAGNOSIS — Z5181 Encounter for therapeutic drug level monitoring: Secondary | ICD-10-CM

## 2018-11-11 LAB — POCT INR: INR: 3 (ref 2.0–3.0)

## 2018-11-11 NOTE — Patient Instructions (Signed)
Continue coumadin 1 tablet daily except 1 1/2 tablets on Fridays  Recheck in 5 weeks

## 2018-11-19 IMAGING — MG 2D DIGITAL DIAGNOSTIC UNILATERAL RIGHT MAMMOGRAM WITH CAD AND AD
7 series · 8 of 15 positions shown · non-contrast
Comparison: Previous examinations the most recent which is dated
10/08/2015.

ACR Breast Density Category a: The breast tissue is almost entirely
fatty.

CLINICAL DATA: The patient notes a palpable mass within the right
axilla. She believes that this has been present since [REDACTED] and
episodically changes in size.

EXAM:
2D DIGITAL DIAGNOSTIC RIGHT MAMMOGRAM WITH CAD AND ADJUNCT TOMO
ULTRASOUND RIGHT BREAST

[R MLO (1 of 2)]
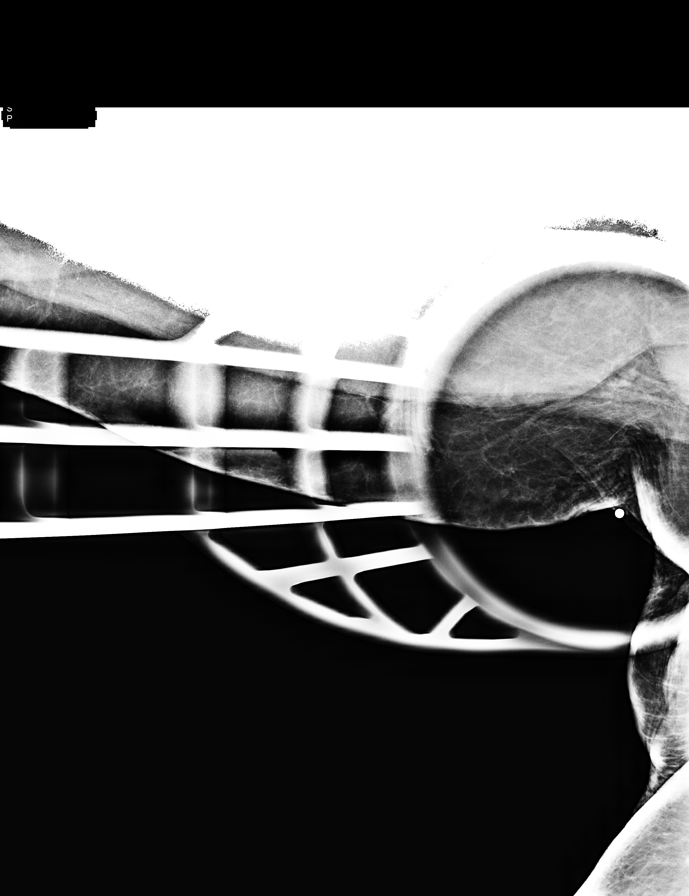

[R MLO synth-2D]
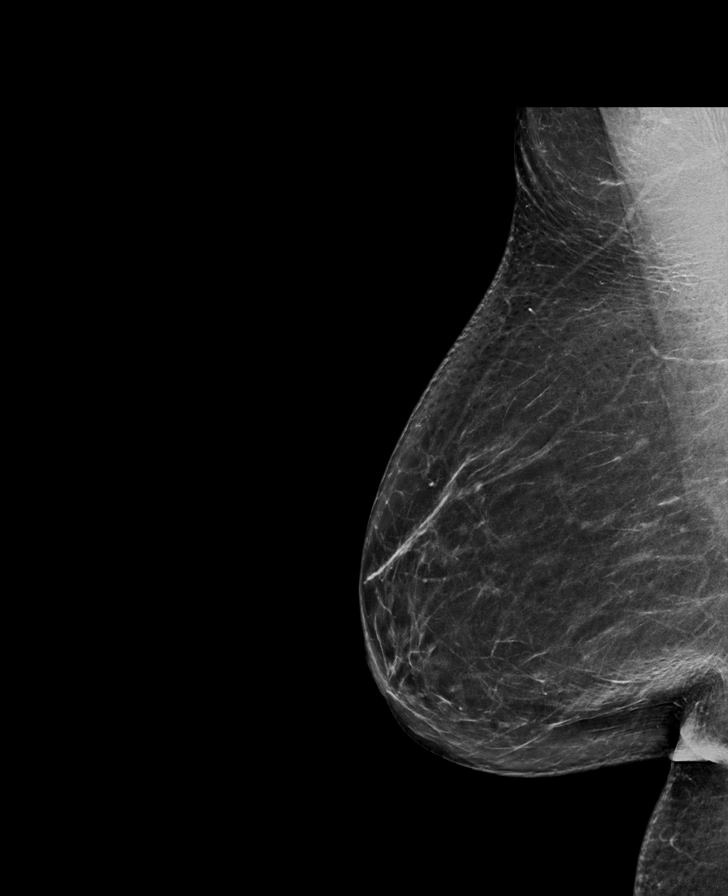

[R CC synth-2D]
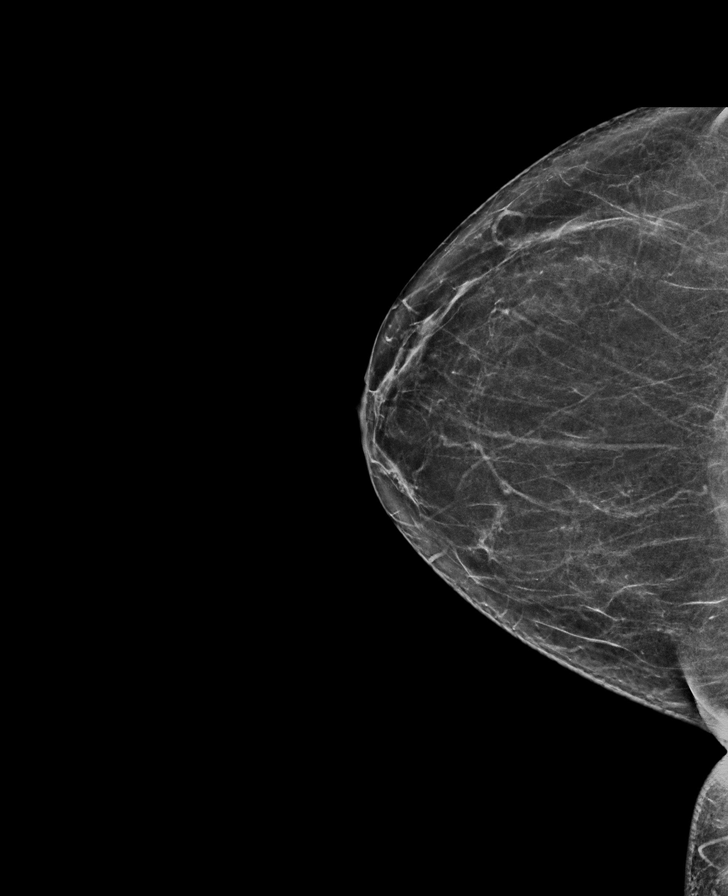

[R MLO (2 of 2)]
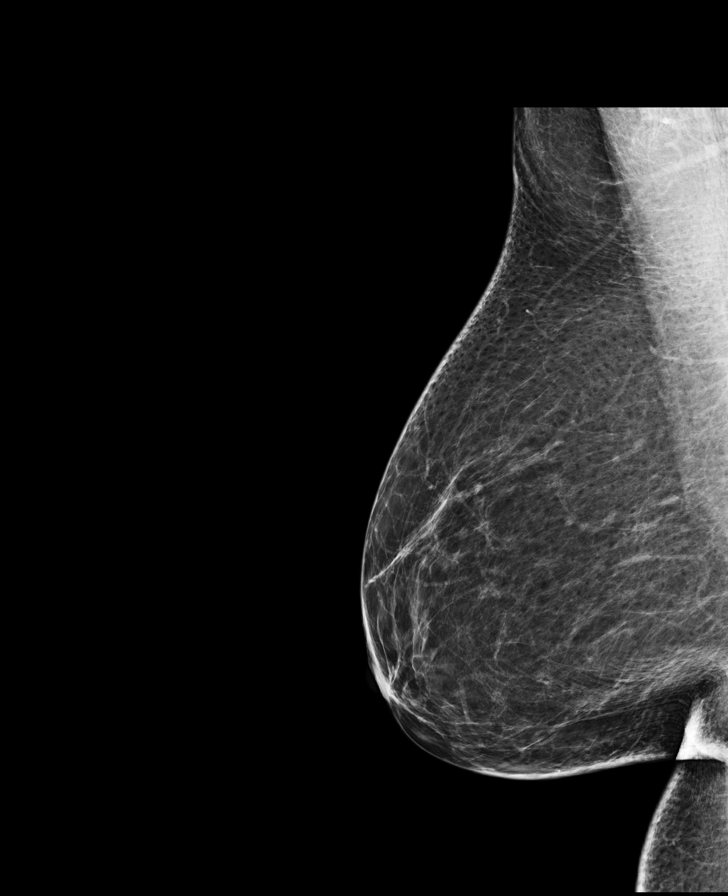

[R CC]
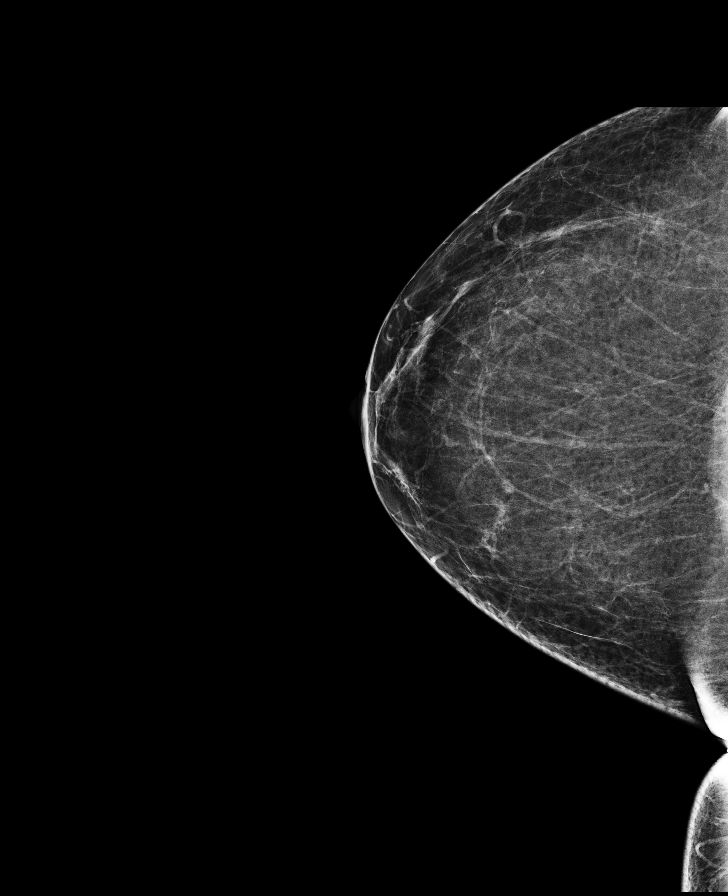

[R CC tomo · 2 of 68 frames shown]
[frame 22/68]
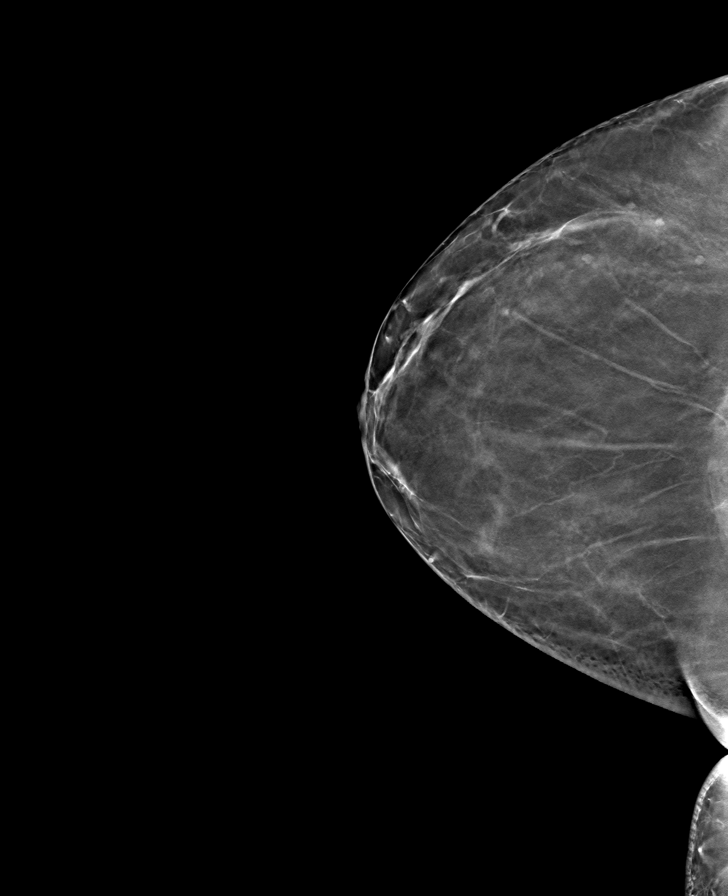
[frame 35/68]
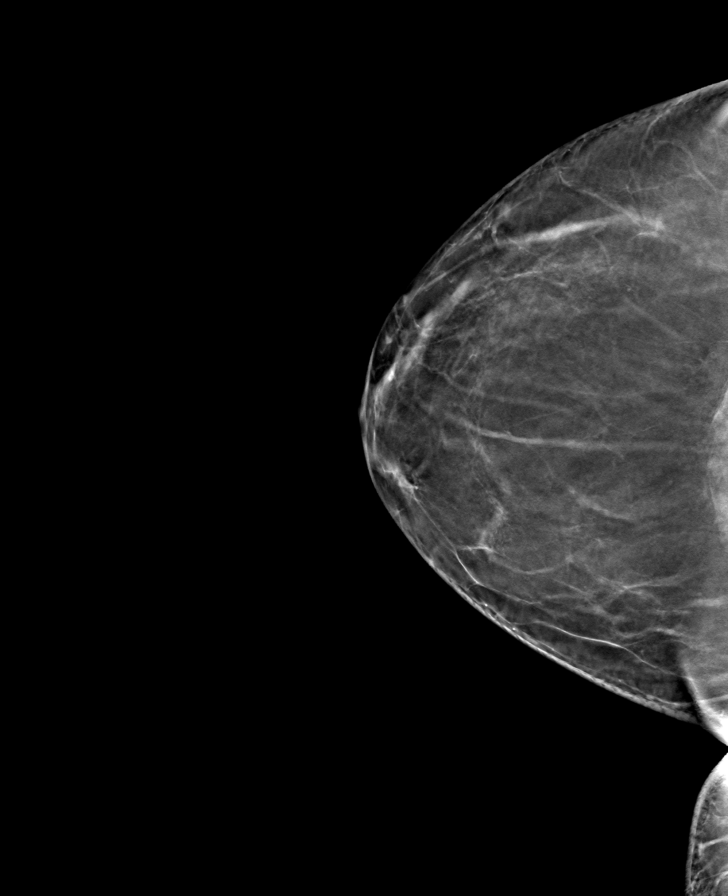

[R MLO tomo · tomo slice 40/79.0]
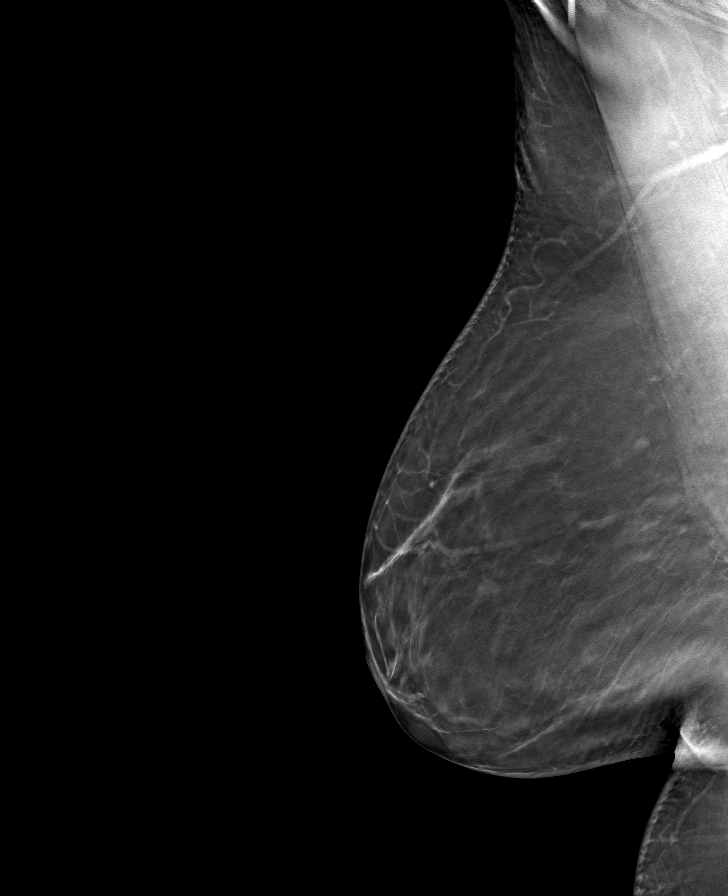

[8 of 15 positions shown; findings below may reference images not displayed]

FINDINGS: There is a superficial mass located in a subdermal location within
the right axilla. The breast parenchymal pattern is stable. There is
no evidence for breast mass. There is no distortion or worrisome
calcification.

Mammographic images were processed with CAD.

On physical exam, there is a subdermal palpable mass within the
right axilla which by physical examination is consistent with a
sebaceous cyst. This measures approximately 1 cm in size by physical
examination.

Targeted ultrasound is performed, showing a subdermal cystic lesion
with a sinus tract extending toward the skin consistent with
sebaceous cyst located within the right axilla corresponding to the
palpable finding. This measures 9 x 8 x 6 mm in size. There is no
adenopathy or additional finding.
IMPRESSION: 9 mm sebaceous cyst within the right axilla corresponding to the
palpable finding. No findings worrisome for malignancy.

RECOMMENDATION:
Bilateral screening mammography in September 2016.

I have discussed the findings and recommendations with the patient.
Results were also provided in writing at the conclusion of the
visit. If applicable, a reminder letter will be sent to the patient
regarding the next appointment.

BI-RADS CATEGORY  2: Benign.

## 2018-12-16 ENCOUNTER — Telehealth: Payer: Self-pay | Admitting: *Deleted

## 2018-12-16 NOTE — Telephone Encounter (Signed)
° ° °  COVID-19 Pre-Screening Questions:  . In the past 7 to 10 days have you had a cough,  shortness of breath, headache, congestion, fever (100 or greater) body aches, chills, sore throat, or sudden loss of taste or sense of smell? No . Have you been around anyone with known Covid 19. No . Have you been around anyone who is awaiting Covid 19 test results in the past 7 to 10 days? No . Have you been around anyone who has been exposed to Covid 19, or has mentioned symptoms of Covid 19 within the past 7 to 10 days? NO  If you have any concerns/questions about symptoms patients report during screening (either on the phone or at [facility]). Contact the provider seeing the patient or DOD for further guidance.  If neither are available contact a member of the leadership team.            

## 2018-12-17 ENCOUNTER — Ambulatory Visit (INDEPENDENT_AMBULATORY_CARE_PROVIDER_SITE_OTHER): Payer: Medicare Other | Admitting: *Deleted

## 2018-12-17 DIAGNOSIS — I82401 Acute embolism and thrombosis of unspecified deep veins of right lower extremity: Secondary | ICD-10-CM | POA: Diagnosis not present

## 2018-12-17 DIAGNOSIS — Z5181 Encounter for therapeutic drug level monitoring: Secondary | ICD-10-CM | POA: Diagnosis not present

## 2018-12-17 DIAGNOSIS — I4891 Unspecified atrial fibrillation: Secondary | ICD-10-CM | POA: Diagnosis not present

## 2018-12-17 LAB — POCT INR: INR: 3.5 — AB (ref 2.0–3.0)

## 2018-12-17 NOTE — Patient Instructions (Signed)
Decrease coumadin to 1 tablet daily Recheck in 5 weeks

## 2019-02-10 ENCOUNTER — Other Ambulatory Visit: Payer: Self-pay | Admitting: Cardiology

## 2019-02-24 ENCOUNTER — Ambulatory Visit (INDEPENDENT_AMBULATORY_CARE_PROVIDER_SITE_OTHER): Payer: Medicare Other | Admitting: Pharmacist Clinician (PhC)/ Clinical Pharmacy Specialist

## 2019-02-24 ENCOUNTER — Other Ambulatory Visit: Payer: Self-pay

## 2019-02-24 DIAGNOSIS — Z79899 Other long term (current) drug therapy: Secondary | ICD-10-CM

## 2019-02-24 DIAGNOSIS — Z86718 Personal history of other venous thrombosis and embolism: Secondary | ICD-10-CM

## 2019-02-24 DIAGNOSIS — Z5181 Encounter for therapeutic drug level monitoring: Secondary | ICD-10-CM

## 2019-02-24 DIAGNOSIS — I4891 Unspecified atrial fibrillation: Secondary | ICD-10-CM

## 2019-02-24 DIAGNOSIS — I82401 Acute embolism and thrombosis of unspecified deep veins of right lower extremity: Secondary | ICD-10-CM

## 2019-02-24 DIAGNOSIS — D689 Coagulation defect, unspecified: Secondary | ICD-10-CM

## 2019-02-24 LAB — POCT INR: INR: 2.4 (ref 2.0–3.0)

## 2019-03-05 ENCOUNTER — Other Ambulatory Visit: Payer: Self-pay | Admitting: Cardiology

## 2019-04-08 ENCOUNTER — Other Ambulatory Visit: Payer: Self-pay

## 2019-04-08 ENCOUNTER — Ambulatory Visit (INDEPENDENT_AMBULATORY_CARE_PROVIDER_SITE_OTHER): Payer: Medicare Other | Admitting: Pharmacist

## 2019-04-08 DIAGNOSIS — D689 Coagulation defect, unspecified: Secondary | ICD-10-CM | POA: Diagnosis not present

## 2019-04-08 DIAGNOSIS — I4891 Unspecified atrial fibrillation: Secondary | ICD-10-CM | POA: Diagnosis not present

## 2019-04-08 DIAGNOSIS — Z5181 Encounter for therapeutic drug level monitoring: Secondary | ICD-10-CM | POA: Diagnosis not present

## 2019-04-08 DIAGNOSIS — Z86718 Personal history of other venous thrombosis and embolism: Secondary | ICD-10-CM

## 2019-04-08 DIAGNOSIS — Z79899 Other long term (current) drug therapy: Secondary | ICD-10-CM

## 2019-04-08 LAB — POCT INR: INR: 2.7 (ref 2.0–3.0)

## 2019-04-08 NOTE — Patient Instructions (Signed)
Description   Continue with coumadin 1 tablet daily Recheck in 6 weeks

## 2019-05-14 ENCOUNTER — Other Ambulatory Visit: Payer: Self-pay | Admitting: Cardiology

## 2019-05-26 ENCOUNTER — Other Ambulatory Visit: Payer: Self-pay

## 2019-05-26 ENCOUNTER — Ambulatory Visit (INDEPENDENT_AMBULATORY_CARE_PROVIDER_SITE_OTHER): Payer: Medicare Other | Admitting: *Deleted

## 2019-05-26 DIAGNOSIS — I4891 Unspecified atrial fibrillation: Secondary | ICD-10-CM | POA: Diagnosis not present

## 2019-05-26 DIAGNOSIS — Z5181 Encounter for therapeutic drug level monitoring: Secondary | ICD-10-CM | POA: Diagnosis not present

## 2019-05-26 LAB — POCT INR: INR: 2 (ref 2.0–3.0)

## 2019-05-26 NOTE — Patient Instructions (Signed)
Continue with coumadin 1 tablet daily Recheck in 6 weeks 

## 2019-06-27 ENCOUNTER — Telehealth: Payer: Self-pay | Admitting: Cardiology

## 2019-06-27 NOTE — Telephone Encounter (Signed)
Reports having SOB this morning that has now resolved. Reports left arm pain rated 6/10 that started one week ago and happens more when she moves it. Has not taken any tylenol. Advised that she could use tylenol for her arm pain. Reports left arm pain is better now. Denies dizziness, SOB or palpitations.  Reports being treated for cold symptoms 2 weeks ago and was given amoxicillin 500 mg and tesslon pearles. Reports cold symptoms have improved. Reports being tested for covid and flu 2 weeks ago and both were negative. Reports taking aleve periodically for pain. Advised that aleve should not be taken with warfarin Virtual visit arranged for 06/30/2019 with Barrett. Advised to contact her PCP about her left arm pain. Advised is SOB gets worse, to go to the ED for an evaluation. Verbalized understanding of plan.  Patient verbally consented for telehealth visits with [facility] and understands that her insurance company will be billed for the encounter.   Aware to have vitals available

## 2019-06-27 NOTE — Telephone Encounter (Signed)
Pt left a message requesting an apt today w/ Dr. Harl Bowie, stated she has some concerns and would like someone to please give her a call @ [phone]

## 2019-06-30 ENCOUNTER — Telehealth (INDEPENDENT_AMBULATORY_CARE_PROVIDER_SITE_OTHER): Payer: Medicare Other | Admitting: Physician Assistant

## 2019-06-30 ENCOUNTER — Encounter: Payer: Self-pay | Admitting: *Deleted

## 2019-06-30 DIAGNOSIS — I10 Essential (primary) hypertension: Secondary | ICD-10-CM

## 2019-06-30 DIAGNOSIS — E782 Mixed hyperlipidemia: Secondary | ICD-10-CM

## 2019-06-30 DIAGNOSIS — R0789 Other chest pain: Secondary | ICD-10-CM | POA: Diagnosis not present

## 2019-06-30 DIAGNOSIS — R079 Chest pain, unspecified: Secondary | ICD-10-CM

## 2019-06-30 NOTE — Progress Notes (Signed)
Virtual Visit via Telephone Note   This visit type was conducted due to national recommendations for restrictions regarding the COVID-19 Pandemic (e.g. social distancing) in an effort to limit this patient's exposure and mitigate transmission in our community.  Due to her co-morbid illnesses, this patient is at least at moderate risk for complications without adequate follow up.  This format is felt to be most appropriate for this patient at this time.  The patient did not have access to video technology/had technical difficulties with video requiring transitioning to audio format only (telephone).  All issues noted in this document were discussed and addressed.  No physical exam could be performed with this format.  Please refer to the patient's chart for her  consent to telehealth for [facility].  Evaluation Performed:  Follow-up visit  This visit type was conducted due to national recommendations for restrictions regarding the COVID-19 Pandemic (e.g. social distancing).  This format is felt to be most appropriate for this patient at this time.  All issues noted in this document were discussed and addressed.  No physical exam was performed (except for noted visual exam findings with Video Visits).  Please refer to the patient's chart (MyChart message for video visits and phone note for telephone visits) for the patient's consent to telehealth for [facility].  Date:  06/30/2019   ID:  Renee Pratt, DOB Feb 14, 1946, MRN [id number]  Patient Location:  Home  Provider location:   Office  PCP:  Sandi Mealy, MD  Cardiologist:  Carlyle Dolly, MD 07/23/2018 Electrophysiologist:  None   Chief Complaint:  Chest pain  History of Present Illness:    Renee Pratt is a 73 y.o. female who presents via audio/video conferencing for a telehealth visit today.    73 y.o. yo female who has a hx of recurrent DVT s/p IVC filter, indef hypercoag eval per hematology, on coumadin  (DOAC too $$$), PACs and PVCs cause palpitations>>BB improves, HTN, HLD w/ muscle aches on statins>>Pravachol 20 mg qd, GERD,   She was having some chest discomfort, her chest felt "stiff" or tight.  The tightness reached a 5/10.  She got some SOB w/ this. She had some L arm soreness but felt she had slept on it wrong. The chest pain finally resolved after a week. Her L arm got better, but she slept on it wrong again and had more pain today.   Her CP has finally resolved. However, she is worried that it will come back and would like further evaluation. She was concerned that she had a heart attack.   She recently had URI, got ABX and cough pills. She had a cough, still has that a little.   Her BP was very high when she felt bad, has come down now. Today, SBP 110s.  The patient has had symptoms concerning for COVID-19 infection (fever, chills, cough, or new shortness of breath). However, she went to Urgent Care and tested negative for COVID.    Prior CV studies:   The following studies were reviewed today  ECHO: 08/2018  1. The left ventricle has normal systolic function, with an ejection fraction of 55-60%. The cavity size was normal. Left ventricular diastolic Doppler parameters are consistent with impaired relaxation Indeterminent filling pressures No evidence of left ventricular regional wall motion abnormalities.  2. The right ventricle has normal systolic function. The cavity was normal. There is no increase in right ventricular wall thickness.  3. The mitral  valve is normal in structure.  4. The tricuspid valve is normal in structure.  5. The aortic valve is tricuspid.  6. The pulmonic valve was grossly normal. Pulmonic valve regurgitation is mild by color flow Doppler.  7. No evidence of left ventricular regional wall motion abnormalities.  8. Right atrial pressure is estimated at 10 mmHg.   Past Medical History:  Diagnosis Date  . Allergy   . 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  Cataract   . Clotting disorder ([place])   . DDD (degenerative disc disease), lumbar   . Depression   . 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 First time 2011, another in 2012, 03/2013  . GERD (gastroesophageal reflux disease)    hiatal hernia  . 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 Hyperlipidemia   . Hypertension   . Uterine fibroid   . Vaginal Pap smear, abnormal    Past Surgical History:  Procedure Laterality Date  . CERVICAL BIOPSY    . COLPOSCOPY    . INSERTION OF VENA CAVA FILTER  01-17-2011   dr pindr # [phone]  [place]     Current Meds  Medication Sig  . amLODipine (NORVASC) 10 MG tablet Take 1 tablet (10 mg total) by mouth daily.  Marland Kitchen docusate sodium (COLACE) 100 MG capsule Take 100 mg by mouth daily as needed for mild constipation.  . Ergocalciferol (VITAMIN D2) 50 MCG (2000 UT) TABS Take 1 tablet by mouth daily.  . hydrochlorothiazide (HYDRODIURIL) 25 MG tablet Take 1 tablet (25 mg total) by mouth daily.  . metoprolol tartrate (LOPRESSOR) 25 MG tablet TAKE 1/2 (ONE-HALF) TABLET BY MOUTH TWICE DAILY  . pravastatin (PRAVACHOL) 40 MG tablet Take 1 tablet (40 mg total) by mouth every evening. (Patient taking differently: Take 40 mg by mouth every evening. Pt currently taking 1/2 tab daily)  . spironolactone (ALDACTONE) 25 MG tablet Take 1 tablet by mouth once daily  . warfarin (COUMADIN) 5 MG tablet TAKE 1 TABLET BY MOUTH ONCE DAILY OR  AS  DIRECTED     Allergies:   Lisinopril, Atorvastatin, Crestor [rosuvastatin calcium], and Tetracyclines & related   Social History   Tobacco Use  . Smoking status: Former Smoker    Packs/day: 0.25    Years: 20.00    Pack years: 5.00    Types: Cigarettes    Quit date: 06/18/1985    Years since quitting: 34.0  . Smokeless tobacco: Never Used  . Tobacco comment: Quit x 25 years  Substance Use Topics  . Alcohol use: Yes    Alcohol/week: 0.0 standard  drinks    Comment: Socially  . Drug use: No     Family Hx: The patient's family history includes AAA (abdominal aortic aneurysm) in her mother; Arthritis in her mother; COPD in her father; Cancer (age of onset: 30) in her father; Colon cancer in her cousin; Depression in her maternal grandmother and son; Hypertension in her father and mother; Kidney disease in her mother; Other in her cousin; Stroke (age of onset: 42) in her mother. There is no history of Clotting disorder or Colon polyps.  ROS:   Please see the history of present illness.    All other systems reviewed and are negative.   Labs/Other Tests and Data Reviewed:    Recent Labs: No results found for requested labs within last 8760 hours.   CBC  Component Value Date/Time   WBC 3.6 (L) 09/12/2017 0833   RBC 4.14 09/12/2017 0833   HGB 12.0 09/12/2017 0833   HGB 11.8 01/15/2017 1410   HGB 12.4 05/13/2013 1229   HCT 35.5 09/12/2017 0833   HCT 36.4 01/15/2017 1410   HCT 38.3 05/13/2013 1229   PLT 198 09/12/2017 0833   PLT 187 01/15/2017 1410   MCV 85.7 09/12/2017 0833   MCV 89 01/15/2017 1410   MCV 88.9 05/13/2013 1229   MCH 29.0 09/12/2017 0833   MCHC 33.8 09/12/2017 0833   RDW 14.4 09/12/2017 0833   RDW 15.7 (H) 01/15/2017 1410   RDW 15.1 (H) 05/13/2013 1229   LYMPHSABS 1.8 01/15/2017 1410   LYMPHSABS 1.7 05/13/2013 1229   MONOABS 0.4 06/07/2013 1642   MONOABS 0.3 05/13/2013 1229   EOSABS 0.1 01/15/2017 1410   BASOSABS 0.0 01/15/2017 1410   BASOSABS 0.0 05/13/2013 1229    CMP Latest Ref Rng & Units 09/12/2017 01/15/2017 02/24/2016  Glucose 65 - 99 mg/dL 103(H) 94 92  BUN 7 - 25 mg/dL 14 12 13   Creatinine 0.60 - 0.93 mg/dL 0.99(H) 0.77 0.83  Sodium 135 - 146 mmol/L 141 142 142  Potassium 3.5 - 5.3 mmol/L 3.8 3.5 3.5  Chloride 98 - 110 mmol/L 102 101 100  CO2 20 - 32 mmol/L 33(H) 26 25  Calcium 8.6 - 10.4 mg/dL 9.3 9.2 9.2  Total Protein 6.1 - 8.1 g/dL 6.9 6.8 -  Total Bilirubin 0.2 - 1.2 mg/dL 0.4 0.2 -    Alkaline Phos 39 - 117 IU/L - 73 -  AST 10 - 35 U/L 12 14 -  ALT 6 - 29 U/L 11 10 -     Recent Lipid Panel Lab Results  Component Value Date/Time   CHOL 282 (H) 09/12/2017 08:33 AM   CHOL 290 (H) 01/15/2017 02:10 PM   CHOL 236 (H) 12/19/2012 11:26 AM   TRIG 96 09/12/2017 08:33 AM   TRIG 145 12/08/2014 04:34 PM   TRIG 111 12/19/2012 11:26 AM   HDL 50 (L) 09/12/2017 08:33 AM   HDL 48 01/15/2017 02:10 PM   HDL 49 12/08/2014 04:34 PM   HDL 48 12/19/2012 11:26 AM   CHOLHDL 5.6 (H) 09/12/2017 08:33 AM   LDLCALC 210 (H) 09/12/2017 08:33 AM   LDLCALC 235 (H) 10/09/2013 11:43 AM   LDLCALC 166 (H) 12/19/2012 11:26 AM   LDLDIRECT 226 (H) 01/15/2017 02:10 PM   Lab Results  Component Value Date   INR 2.0 05/26/2019   INR 2.7 04/08/2019   INR 2.4 02/24/2019     Wt Readings from Last 3 Encounters:  07/23/18 99.6 kg  01/22/18 99.3 kg  10/08/17 101.2 kg     Objective:    Vital Signs:  There were no vitals taken for this visit.   73 y.o. female in no acute distress over the phone.   ASSESSMENT & PLAN:    1.  Chest pain: Her symptoms resolved without intervention or evaluation -However, her chest pain was prolonged and she has multiple cardiac risk factors -I feel a noninvasive evaluation is adequate. -We will schedule Lexiscan Myoview, follow-up on the results  2.  Hypertension: -When she was sick, her blood pressure was significantly elevated at 170/84, unusual for her -However, as her acute illness improved, her blood pressure has come down -Systolic blood pressures in the 110s and 120s are normal for her -She never feels lightheaded, weak, or dizzy.  No med changes  3.  Hyperlipidemia -She  is taking Pravachol 20 mg a day -We have no labs in our system since March 2019  COVID-19 Education: The signs and symptoms of COVID-19 were discussed with the patient and how to seek care for testing (follow up with PCP or arrange E-visit).  The importance of social distancing  was discussed today.  Patient Risk:   After full review of this patient's clinical status, I feel that they are at least moderate risk at this time.  Time:   Today, I have spent 15 minutes with the patient with telehealth technology discussing chest pain and other cardiac issues.     Medication Adjustments/Labs and Tests Ordered: Current medicines are reviewed at length with the patient today.  Concerns regarding medicines are outlined above.  Tests Ordered: No orders of the defined types were placed in this encounter.  Medication Changes: No orders of the defined types were placed in this encounter.   Disposition:  Follow up with Carlyle Dolly, MD   Signed, Rosaria Ferries, PA-C  06/30/2019 1:55 PM    [facility]

## 2019-06-30 NOTE — Addendum Note (Signed)
Addended by: Levonne Hubert on: 06/30/2019 03:55 PM   Modules accepted: Orders

## 2019-06-30 NOTE — Patient Instructions (Signed)
Medication Instructions:  Your physician recommends that you continue on your current medications as directed. Please refer to the Current Medication list given to you today.  *If you need a refill on your cardiac medications before your next appointment, please call your pharmacy*  Lab Work: NONE  If you have labs (blood work) drawn today and your tests are completely normal, you will receive your results only by: Marland Kitchen MyChart Message (if you have MyChart) OR . A paper copy in the mail If you have any lab test that is abnormal or we need to change your treatment, we will call you to review the results.  Testing/Procedures: Your physician has requested that you have a lexiscan myoview. For further information please visit [email or web address]. Please follow instruction sheet, as given.   Follow-Up: At [facility], you and your health needs are our priority.  As part of our continuing mission to provide you with exceptional heart care, we have created designated Provider Care Teams.  These Care Teams include your primary Cardiologist (physician) and Advanced Practice Providers (APPs -  Physician Assistants and Nurse Practitioners) who all work together to provide you with the care you need, when you need it.  Your next appointment:   2-3 month(s)  The format for your next appointment:   In Person  Provider:   Carlyle Dolly, MD  Other Instructions Thank you for choosing [facility]!

## 2019-07-07 ENCOUNTER — Ambulatory Visit (INDEPENDENT_AMBULATORY_CARE_PROVIDER_SITE_OTHER): Payer: Medicare Other | Admitting: *Deleted

## 2019-07-07 ENCOUNTER — Other Ambulatory Visit: Payer: Self-pay

## 2019-07-07 DIAGNOSIS — I82401 Acute embolism and thrombosis of unspecified deep veins of right lower extremity: Secondary | ICD-10-CM | POA: Diagnosis not present

## 2019-07-07 DIAGNOSIS — Z5181 Encounter for therapeutic drug level monitoring: Secondary | ICD-10-CM | POA: Diagnosis not present

## 2019-07-07 DIAGNOSIS — I4891 Unspecified atrial fibrillation: Secondary | ICD-10-CM

## 2019-07-07 LAB — POCT INR: INR: 3 (ref 2.0–3.0)

## 2019-07-07 NOTE — Patient Instructions (Signed)
Continue with coumadin 1 tablet daily Recheck in 6 weeks 

## 2019-07-09 ENCOUNTER — Encounter (HOSPITAL_BASED_OUTPATIENT_CLINIC_OR_DEPARTMENT_OTHER)
Admission: RE | Admit: 2019-07-09 | Discharge: 2019-07-09 | Disposition: A | Discharge status: Home / Self Care | Payer: Medicare Other | Source: Ambulatory Visit | Attending: Physician Assistant | Admitting: Physician Assistant

## 2019-07-09 ENCOUNTER — Other Ambulatory Visit: Payer: Self-pay

## 2019-07-09 ENCOUNTER — Encounter (HOSPITAL_COMMUNITY)
Admission: RE | Admit: 2019-07-09 | Discharge: 2019-07-09 | Disposition: A | Discharge status: Home / Self Care | Payer: Medicare Other | Source: Ambulatory Visit | Attending: Physician Assistant | Admitting: Physician Assistant

## 2019-07-09 DIAGNOSIS — R0789 Other chest pain: Secondary | ICD-10-CM | POA: Diagnosis present

## 2019-07-09 LAB — NM MYOCAR MULTI W/SPECT W/WALL MOTION / EF
LV dias vol: 57 mL (ref 46–106)
LV sys vol: 16 mL
Peak HR: 97 {beats}/min
RATE: 0.27
Rest HR: 69 {beats}/min
SDS: 0
SRS: 1
SSS: 1
TID: 1.24

## 2019-07-09 MED ORDER — REGADENOSON 0.4 MG/5ML IV SOLN
INTRAVENOUS | Status: AC
  Administered 2019-07-09: 0.4 mg via INTRAVENOUS
  Filled 2019-07-09: qty 5

## 2019-07-09 MED ORDER — TECHNETIUM TC 99M TETROFOSMIN IV KIT
30.0000 | PACK | Freq: Once | INTRAVENOUS | Status: AC | PRN
  Administered 2019-07-09: 33 via INTRAVENOUS

## 2019-07-09 MED ORDER — SODIUM CHLORIDE FLUSH 0.9 % IV SOLN
INTRAVENOUS | Status: AC
  Administered 2019-07-09: 10 mL via INTRAVENOUS
  Filled 2019-07-09: qty 10

## 2019-07-09 MED ORDER — TECHNETIUM TC 99M TETROFOSMIN IV KIT
10.0000 | PACK | Freq: Once | INTRAVENOUS | Status: AC | PRN
  Administered 2019-07-09: 9.57 via INTRAVENOUS

## 2019-08-28 ENCOUNTER — Other Ambulatory Visit: Payer: Self-pay | Admitting: Cardiology

## 2019-08-29 ENCOUNTER — Other Ambulatory Visit: Payer: Self-pay | Admitting: Cardiology

## 2019-09-01 ENCOUNTER — Encounter: Payer: Self-pay | Admitting: Cardiology

## 2019-09-01 ENCOUNTER — Telehealth (INDEPENDENT_AMBULATORY_CARE_PROVIDER_SITE_OTHER): Payer: Medicare Other | Admitting: Cardiology

## 2019-09-01 VITALS — BP 147/82 | HR 81 | Ht 66.0 in | Wt 224.0 lb

## 2019-09-01 DIAGNOSIS — R002 Palpitations: Secondary | ICD-10-CM

## 2019-09-01 DIAGNOSIS — I10 Essential (primary) hypertension: Secondary | ICD-10-CM | POA: Diagnosis not present

## 2019-09-01 DIAGNOSIS — R0789 Other chest pain: Secondary | ICD-10-CM | POA: Diagnosis not present

## 2019-09-01 DIAGNOSIS — Z86718 Personal history of other venous thrombosis and embolism: Secondary | ICD-10-CM | POA: Diagnosis not present

## 2019-09-01 DIAGNOSIS — E782 Mixed hyperlipidemia: Secondary | ICD-10-CM

## 2019-09-01 DIAGNOSIS — Z7901 Long term (current) use of anticoagulants: Secondary | ICD-10-CM

## 2019-09-01 NOTE — Patient Instructions (Signed)
Medication Instructions:   Your physician recommends that you continue on your current medications as directed. Please refer to the Current Medication list given to you today.  Labwork:  NONE  Testing/Procedures:  NONE  Follow-Up:  Your physician recommends that you schedule a follow-up appointment in: 6 months (office or virtual). You will receive a reminder letter in the mail in about 4 months reminding you to call and schedule your appointment. If you don't receive this letter, please contact our office.  Any Other Special Instructions Will Be Listed Below (If Applicable).  If you need a refill on your cardiac medications before your next appointment, please call your pharmacy. 

## 2019-09-01 NOTE — Progress Notes (Signed)
Virtual Visit via Telephone Note   This visit type was conducted due to national recommendations for restrictions regarding the COVID-19 Pandemic (e.g. social distancing) in an effort to limit this patient's exposure and mitigate transmission in our community.  Due to her co-morbid illnesses, this patient is at least at moderate risk for complications without adequate follow up.  This format is felt to be most appropriate for this patient at this time.  The patient did not have access to video technology/had technical difficulties with video requiring transitioning to audio format only (telephone).  All issues noted in this document were discussed and addressed.  No physical exam could be performed with this format.  Please refer to the patient's chart for her  consent to telehealth for [facility].   Date:  09/07/2019   ID:  Renee Pratt, DOB 1946-01-11, MRN [id number]  Patient Location: Home Provider Location: Office  PCP:  Sandi Mealy, MD  Cardiologist:  Carlyle Dolly, MD  Electrophysiologist:  None   Evaluation Performed:  Follow-Up Visit  Chief Complaint:  Follow up  History of Present Illness:    Renee Pratt is a 74 y.o. female today for follow up of the following medical problems.   1.HTN - cough x 2 months, thinks may be related to lisionpril. Switched from both losartan and valsartan in the past due to separate recalls   - bp at pcp recent visit 116/76 - left arm 128/75 p 70  Right arm 130/74  2. History of DVT - notes indicate recurrent DVTs in 2011, 2012, 2014.  - has been on anticoagulation - notes mention prior IVC filter placed. Venatech Vena cava filter 01/2011 - previously seen by hematologty, 05/2013 recs indefinite anticoagulation - she reports on eliquis in the past, too expensive.Has been on coumadin.   - DOACs too expensive, restarted coumadin - compliant withanticoag    3. Palpitations - a 24 hr holter was ordered by  pcp 02/2017 shows SR, rare PACs and PVCs. - improvedsymptomswith beta blocker.  - no recent symptoms.   4. Hyperlipidemia -reports muscle aches on statins. Has been on low dose pravastatin 02/2018 TC 252 TG 109 LDL 180 - takes pravastin 20mg  daily. Mixed compliance previously  - recent muscle aches, has stopped pravastatin with resolution of symptoms - retrying 1/2 tablet every other day.    5. Chest pain - reported symptoms in 06/2019, seen by PA Barrett - 06/2019 nuclear stress: no ischemia  - no recent symptoms   SH: moved from [place] first covid vaccine at [facility]     The patient does not have symptoms concerning for COVID-19 infection (fever, chills, cough, or new shortness of breath).    Past Medical History:  Diagnosis Date  . Allergy   . 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 Cataract   . Clotting disorder ([place])   . DDD (degenerative disc disease), lumbar   . Depression   . 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 First time 2011, another in 2012, 03/2013  . GERD (gastroesophageal reflux disease)    hiatal hernia  . 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 Hyperlipidemia   . Hypertension   . Uterine fibroid   . Vaginal Pap smear, abnormal    Past Surgical History:  Procedure Laterality Date  . CERVICAL BIOPSY    . COLPOSCOPY    . INSERTION  OF VENA CAVA FILTER  01-17-2011   dr pindr # [phone]  [place]     Current Meds  Medication Sig  . amLODipine (NORVASC) 5 MG tablet Take 1 tablet by mouth daily.  Marland Kitchen docusate sodium (COLACE) 100 MG capsule Take 100 mg by mouth daily as needed for mild constipation.  . Ergocalciferol (VITAMIN D2) 50 MCG (2000 UT) TABS Take 1 tablet by mouth daily.  . hydrochlorothiazide (HYDRODIURIL) 25 MG tablet Take 1 tablet (25 mg total) by mouth daily.  . metoprolol tartrate (LOPRESSOR) 25 MG tablet TAKE 1/2 (ONE-HALF) TABLET BY MOUTH  TWICE DAILY  . pravastatin (PRAVACHOL) 40 MG tablet Take 0.5 tablets by mouth every other day.  . spironolactone (ALDACTONE) 25 MG tablet Take 1 tablet by mouth once daily  . warfarin (COUMADIN) 5 MG tablet TAKE 1 TABLET BY MOUTH ONCE DAILY OR AS DIRECTED     Allergies:   Lisinopril, Atorvastatin, Crestor [rosuvastatin calcium], and Tetracyclines & related   Social History   Tobacco Use  . Smoking status: Former Smoker    Packs/day: 0.25    Years: 20.00    Pack years: 5.00    Types: Cigarettes    Quit date: 06/18/1985    Years since quitting: 34.2  . Smokeless tobacco: Never Used  . Tobacco comment: Quit x 25 years  Substance Use Topics  . Alcohol use: Yes    Alcohol/week: 0.0 standard drinks    Comment: Socially  . Drug use: No     Family Hx: The patient's family history includes AAA (abdominal aortic aneurysm) in her mother; Arthritis in her mother; COPD in her father; Cancer (age of onset: 38) in her father; Colon cancer in her cousin; Depression in her maternal grandmother and son; Hypertension in her father and mother; Kidney disease in her mother; Other in her cousin; Stroke (age of onset: 10) in her mother. There is no history of Clotting disorder or Colon polyps.  ROS:   Please see the history of present illness.     All other systems reviewed and are negative.   Prior CV studies:   The following studies were reviewed today:  08/2018 echo IMPRESSIONS    1. The left ventricle has normal systolic function, with an ejection  fraction of 55-60%. The cavity size was normal. Left ventricular diastolic  Doppler parameters are consistent with impaired relaxation Indeterminent  filling pressures No evidence of  left ventricular regional wall motion abnormalities.  2. The right ventricle has normal systolic function. The cavity was  normal. There is no increase in right ventricular wall thickness.  3. The mitral valve is normal in structure.  4. The tricuspid valve  is normal in structure.  5. The aortic valve is tricuspid.  6. The pulmonic valve was grossly normal. Pulmonic valve regurgitation is  mild by color flow Doppler.  7. No evidence of left ventricular regional wall motion abnormalities.  8. Right atrial pressure is estimated at 10 mmHg.   06/2019 nuclear stress  There was no ST segment deviation noted during stress.  The study is normal. There are no perfusion defects consistent with prior infarct or current ischemia.  This is a low risk study.  The left ventricular ejection fraction is hyperdynamic (>65%).  Labs/Other Tests and Data Reviewed:    EKG:  No ECG reviewed.  Recent Labs: No results found for requested labs within last 8760 hours.   Recent Lipid Panel Lab Results  Component Value Date/Time   CHOL 282 (H)  09/12/2017 08:33 AM   CHOL 290 (H) 01/15/2017 02:10 PM   CHOL 236 (H) 12/19/2012 11:26 AM   TRIG 96 09/12/2017 08:33 AM   TRIG 145 12/08/2014 04:34 PM   TRIG 111 12/19/2012 11:26 AM   HDL 50 (L) 09/12/2017 08:33 AM   HDL 48 01/15/2017 02:10 PM   HDL 49 12/08/2014 04:34 PM   HDL 48 12/19/2012 11:26 AM   CHOLHDL 5.6 (H) 09/12/2017 08:33 AM   LDLCALC 210 (H) 09/12/2017 08:33 AM   LDLCALC 235 (H) 10/09/2013 11:43 AM   LDLCALC 166 (H) 12/19/2012 11:26 AM   LDLDIRECT 226 (H) 01/15/2017 02:10 PM    Wt Readings from Last 3 Encounters:  09/01/19 224 lb (101.6 kg)  07/23/18 219 lb 9.6 oz (99.6 kg)  01/22/18 219 lb (99.3 kg)     Objective:    Vital Signs:  BP (!) 147/82 (BP Location: Left Arm) Comment: 127/72 right arm  Pulse 81   Ht 5\' 6"  (1.676 m)   Wt 224 lb (101.6 kg)   BMI 36.15 kg/m    Normal affect. NOrmal speech pattern and tone. Comfortable, no apparent distress. No audible signs of SOB or wheezing.   ASSESSMENT & PLAN:    1.HTN -at goal, continue current meds   2. Recurrent DVT - lifelong anticoagulation -DOACs too expensive, she prefers coumadin - continue current  therapyt   3. Palpitations - prior holter with benign ectopy - no significant symptoms, continue to monitor  4. Chest pain - negative stress test recently, symptoms have improved since last visit - continue to monitor  4. Hyperlipidemia - limitations in statin tolerance, continue pravastatin as tolerated.    COVID-19 Education: The signs and symptoms of COVID-19 were discussed with the patient and how to seek care for testing (follow up with PCP or arrange E-visit).  The importance of social distancing was discussed today.  Time:   Today, I have spent 22 minutes with the patient with telehealth technology discussing the above problems.     Medication Adjustments/Labs and Tests Ordered: Current medicines are reviewed at length with the patient today.  Concerns regarding medicines are outlined above.   Tests Ordered: No orders of the defined types were placed in this encounter.   Medication Changes: No orders of the defined types were placed in this encounter.   Follow Up:  Either In Person or Virtual in 6 month(s)  Signed, Carlyle Dolly, MD  09/07/2019 9:46 AM

## 2019-09-10 ENCOUNTER — Ambulatory Visit (INDEPENDENT_AMBULATORY_CARE_PROVIDER_SITE_OTHER): Payer: Medicare Other | Admitting: *Deleted

## 2019-09-10 ENCOUNTER — Other Ambulatory Visit: Payer: Self-pay

## 2019-09-10 DIAGNOSIS — Z5181 Encounter for therapeutic drug level monitoring: Secondary | ICD-10-CM | POA: Diagnosis not present

## 2019-09-10 DIAGNOSIS — I4891 Unspecified atrial fibrillation: Secondary | ICD-10-CM | POA: Diagnosis not present

## 2019-09-10 LAB — POCT INR: INR: 2.5 (ref 2.0–3.0)

## 2019-09-10 NOTE — Patient Instructions (Signed)
Continue with coumadin 1 tablet daily Recheck in 6 weeks 

## 2019-10-22 ENCOUNTER — Ambulatory Visit (INDEPENDENT_AMBULATORY_CARE_PROVIDER_SITE_OTHER): Payer: Medicare Other | Admitting: *Deleted

## 2019-10-22 ENCOUNTER — Other Ambulatory Visit: Payer: Self-pay

## 2019-10-22 DIAGNOSIS — Z5181 Encounter for therapeutic drug level monitoring: Secondary | ICD-10-CM

## 2019-10-22 DIAGNOSIS — I4891 Unspecified atrial fibrillation: Secondary | ICD-10-CM | POA: Diagnosis not present

## 2019-10-22 LAB — POCT INR: INR: 2 (ref 2.0–3.0)

## 2019-10-22 NOTE — Patient Instructions (Signed)
Continue with coumadin 1 tablet daily Recheck in 6 weeks 

## 2019-12-03 ENCOUNTER — Ambulatory Visit (INDEPENDENT_AMBULATORY_CARE_PROVIDER_SITE_OTHER): Payer: Medicare Other | Admitting: *Deleted

## 2019-12-03 ENCOUNTER — Other Ambulatory Visit: Payer: Self-pay

## 2019-12-03 DIAGNOSIS — Z5181 Encounter for therapeutic drug level monitoring: Secondary | ICD-10-CM

## 2019-12-03 DIAGNOSIS — I82401 Acute embolism and thrombosis of unspecified deep veins of right lower extremity: Secondary | ICD-10-CM | POA: Diagnosis not present

## 2019-12-03 DIAGNOSIS — I4891 Unspecified atrial fibrillation: Secondary | ICD-10-CM | POA: Diagnosis not present

## 2019-12-03 DIAGNOSIS — Z79899 Other long term (current) drug therapy: Secondary | ICD-10-CM

## 2019-12-03 LAB — POCT INR: INR: 2.9 (ref 2.0–3.0)

## 2019-12-03 NOTE — Patient Instructions (Signed)
Continue with coumadin 1 tablet daily Recheck in 6 weeks 

## 2019-12-11 ENCOUNTER — Other Ambulatory Visit: Payer: Self-pay | Admitting: Cardiology

## 2020-01-14 ENCOUNTER — Ambulatory Visit (INDEPENDENT_AMBULATORY_CARE_PROVIDER_SITE_OTHER): Payer: Medicare Other | Admitting: *Deleted

## 2020-01-14 DIAGNOSIS — Z5181 Encounter for therapeutic drug level monitoring: Secondary | ICD-10-CM | POA: Diagnosis not present

## 2020-01-14 DIAGNOSIS — I4891 Unspecified atrial fibrillation: Secondary | ICD-10-CM | POA: Diagnosis not present

## 2020-01-14 LAB — POCT INR: INR: 2.7 (ref 2.0–3.0)

## 2020-01-14 NOTE — Patient Instructions (Signed)
Continue with coumadin 1 tablet daily Recheck in 6 weeks

## 2020-01-21 ENCOUNTER — Other Ambulatory Visit: Payer: Self-pay | Admitting: Cardiology

## 2020-02-25 ENCOUNTER — Ambulatory Visit (INDEPENDENT_AMBULATORY_CARE_PROVIDER_SITE_OTHER): Payer: Medicare Other | Admitting: *Deleted

## 2020-02-25 DIAGNOSIS — Z5181 Encounter for therapeutic drug level monitoring: Secondary | ICD-10-CM | POA: Diagnosis not present

## 2020-02-25 DIAGNOSIS — I4891 Unspecified atrial fibrillation: Secondary | ICD-10-CM | POA: Diagnosis not present

## 2020-02-25 LAB — POCT INR: INR: 3.2 — AB (ref 2.0–3.0)

## 2020-02-25 NOTE — Patient Instructions (Signed)
Hold warfarin tonight then resume 1 tablet daily  °Recheck in 6 weeks.  °

## 2020-03-16 ENCOUNTER — Ambulatory Visit: Payer: Medicare Other | Admitting: Cardiology

## 2020-03-16 ENCOUNTER — Encounter: Payer: Self-pay | Admitting: Cardiology

## 2020-03-16 VITALS — BP 120/68 | HR 86 | Ht 66.0 in | Wt 224.4 lb

## 2020-03-16 DIAGNOSIS — Z86718 Personal history of other venous thrombosis and embolism: Secondary | ICD-10-CM

## 2020-03-16 DIAGNOSIS — R002 Palpitations: Secondary | ICD-10-CM

## 2020-03-16 DIAGNOSIS — E782 Mixed hyperlipidemia: Secondary | ICD-10-CM

## 2020-03-16 DIAGNOSIS — I10 Essential (primary) hypertension: Secondary | ICD-10-CM | POA: Diagnosis not present

## 2020-03-16 NOTE — Progress Notes (Signed)
Clinical Summary Ms. Gemma is a 74 y.o.female  today for follow up of the following medical problems.    1.HTN - compliant with meds   2. History of DVT - notes indicate recurrent DVTs in 2011, 2012, 2014.  - has been on anticoagulation - notes mention prior IVC filter placed. Venatech Vena cava filter 01/2011 - previously seen by hematologty, 05/2013 recs indefinite anticoagulation - she reports on eliquis in the past, too expensive.Has been on coumadin.   - DOACs too expensive, restarted coumadin - no bleeding on couadmin  3. Palpitations - a 24 hr holter was ordered by pcp 02/2017 shows SR, rare PACs and PVCs. - improvedsymptomswith beta blocker.  - denies any palpitations.   4. Hyperlipidemia -reports muscle aches on statins.Most recnetly on low dose pravastatin every other day and did not tolerate   - she is on zetia 10mg  daily, just started pcp   5. Chest pain - reported symptoms in 06/2019, seen by PA Barrett - 06/2019 nuclear stress: no ischemia  - she denies any recent symptoms   SH: moved from [place]  Has completed covid vaccine.      Past Medical History:  Diagnosis Date  . Allergy   . 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 Cataract   . Clotting disorder ([place])   . DDD (degenerative disc disease), lumbar   . Depression   . 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 First time 2011, another in 2012, 03/2013  . GERD (gastroesophageal reflux disease)    hiatal hernia  . 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 Hyperlipidemia   . Hypertension   . Uterine fibroid   . Vaginal Pap smear, abnormal      Allergies  Allergen Reactions  . Lisinopril     Cough   . Atorvastatin Other (See Comments)    Myalgias   . Crestor [Rosuvastatin Calcium] Other (See Comments)    Myalgias / right sided weakness   . Tetracyclines & Related     Nausea, pain      Current Outpatient Medications  Medication Sig Dispense Refill  . amLODipine (NORVASC) 5 MG tablet Take 1 tablet by mouth daily.    Marland Kitchen docusate sodium (COLACE) 100 MG capsule Take 100 mg by mouth daily as needed for mild constipation.    . Ergocalciferol (VITAMIN D2) 50 MCG (2000 UT) TABS Take 1 tablet by mouth daily.    . hydrochlorothiazide (HYDRODIURIL) 25 MG tablet Take 1 tablet (25 mg total) by mouth daily. 90 tablet 3  . metoprolol tartrate (LOPRESSOR) 25 MG tablet TAKE 1/2 (ONE-HALF) TABLET BY MOUTH TWICE DAILY 90 tablet 0  . pravastatin (PRAVACHOL) 40 MG tablet Take 0.5 tablets by mouth every other day.    . spironolactone (ALDACTONE) 25 MG tablet Take 1 tablet by mouth once daily 90 tablet 1  . warfarin (COUMADIN) 5 MG tablet TAKE 1 TABLET BY MOUTH ONCE DAILY OR AS DIRECTED 35 tablet 6   No current facility-administered medications for this visit.     Past Surgical History:  Procedure Laterality Date  . CERVICAL BIOPSY    . COLPOSCOPY    . INSERTION OF VENA CAVA FILTER  01-17-2011   dr pindr # [phone]  [place]     Allergies  Allergen Reactions  . Lisinopril     Cough   . Atorvastatin Other (See Comments)  Myalgias   . Crestor [Rosuvastatin Calcium] Other (See Comments)    Myalgias / right sided weakness   . Tetracyclines & Related     Nausea, pain      Family History  Problem Relation Age of Onset  . Kidney disease Mother   . Hypertension Mother   . Stroke Mother 78  . AAA (abdominal aortic aneurysm) Mother   . Arthritis Mother   . Cancer Father 44       lung cancer  . Hypertension Father   . COPD Father   . Depression Maternal Grandmother   . Depression Son   . Other Cousin        died fo pulmonary embolus  . Colon cancer Cousin   . Clotting disorder Neg Hx   . Colon polyps Neg Hx      Social History Ms. Bohlken reports that she quit smoking about 34 years ago. Her smoking use included cigarettes. She has a 5.00 pack-year smoking  history. She has never used smokeless tobacco. Ms. Scaturro reports current alcohol use.   Review of Systems CONSTITUTIONAL: No weight loss, fever, chills, weakness or fatigue.  HEENT: Eyes: No visual loss, blurred vision, double vision or yellow sclerae.No hearing loss, sneezing, congestion, runny nose or sore throat.  SKIN: No rash or itching.  CARDIOVASCULAR: per hpi RESPIRATORY: No shortness of breath, cough or sputum.  GASTROINTESTINAL: No anorexia, nausea, vomiting or diarrhea. No abdominal pain or blood.  GENITOURINARY: No burning on urination, no polyuria NEUROLOGICAL: No headache, dizziness, syncope, paralysis, ataxia, numbness or tingling in the extremities. No change in bowel or bladder control.  MUSCULOSKELETAL: No muscle, back pain, joint pain or stiffness.  LYMPHATICS: No enlarged nodes. No history of splenectomy.  PSYCHIATRIC: No history of depression or anxiety.  ENDOCRINOLOGIC: No reports of sweating, cold or heat intolerance. No polyuria or polydipsia.  Marland Kitchen   Physical Examination Today's Vitals   03/16/20 1443  BP: 120/68  Pulse: 86  SpO2: 96%  Weight: 224 lb 6.4 oz (101.8 kg)  Height: 5\' 6"  (1.676 m)   Body mass index is 36.22 kg/m.  Gen: resting comfortably, no acute distress HEENT: no scleral icterus, pupils equal round and reactive, no palptable cervical adenopathy,  CV: RRR, no m/r/g, no jvd Resp: Clear to auscultation bilaterally GI: abdomen is soft, non-tender, non-distended, normal bowel sounds, no hepatosplenomegaly MSK: extremities are warm, no edema.  Skin: warm, no rash Neuro:  no focal deficits Psych: appropriate affect   Diagnostic Studies  08/2018 echo IMPRESSIONS    1. The left ventricle has normal systolic function, with an ejection  fraction of 55-60%. The cavity size was normal. Left ventricular diastolic  Doppler parameters are consistent with impaired relaxation Indeterminent  filling pressures No evidence of  left ventricular  regional wall motion abnormalities.  2. The right ventricle has normal systolic function. The cavity was  normal. There is no increase in right ventricular wall thickness.  3. The mitral valve is normal in structure.  4. The tricuspid valve is normal in structure.  5. The aortic valve is tricuspid.  6. The pulmonic valve was grossly normal. Pulmonic valve regurgitation is  mild by color flow Doppler.  7. No evidence of left ventricular regional wall motion abnormalities.  8. Right atrial pressure is estimated at 10 mmHg.  06/2019 nuclear stress  There was no ST segment deviation noted during stress.  The study is normal. There are no perfusion defects consistent with prior infarct or current ischemia.  This is a low risk study.  The left ventricular ejection fraction is hyperdynamic (>65%).   Assessment and Plan   1.HTN - she is at goal, continue current meds   2. Recurrent DVT - lifelong anticoagulation -DOACs too expensive, she prefers coumadin - continue coumadin   3. Palpitations - prior holter with benign ectopy - no recent issues, continue to monitor   4. Hyperlipidemia - intolerant to statins, very high LDL that will not reach goal with zetia alone - refer to lipid clinic to consider pcsk 9 inhibitor.      Arnoldo Lenis, M.D.

## 2020-03-16 NOTE — Patient Instructions (Signed)
Your physician wants you to follow-up in: [place] will receive a reminder letter in the mail two months in advance. If you don't receive a letter, please call our office to schedule the follow-up appointment.  Your physician recommends that you continue on your current medications as directed. Please refer to the Current Medication list given to you today.  You have been referred to [facility]  Thank you for choosing [facility]!!

## 2020-04-07 ENCOUNTER — Ambulatory Visit (INDEPENDENT_AMBULATORY_CARE_PROVIDER_SITE_OTHER): Payer: Medicare Other | Admitting: *Deleted

## 2020-04-07 DIAGNOSIS — Z5181 Encounter for therapeutic drug level monitoring: Secondary | ICD-10-CM | POA: Diagnosis not present

## 2020-04-07 DIAGNOSIS — I4891 Unspecified atrial fibrillation: Secondary | ICD-10-CM | POA: Diagnosis not present

## 2020-04-07 DIAGNOSIS — I82401 Acute embolism and thrombosis of unspecified deep veins of right lower extremity: Secondary | ICD-10-CM | POA: Diagnosis not present

## 2020-04-07 DIAGNOSIS — Z79899 Other long term (current) drug therapy: Secondary | ICD-10-CM

## 2020-04-07 LAB — POCT INR: INR: 3 (ref 2.0–3.0)

## 2020-04-07 NOTE — Patient Instructions (Signed)
Continue warfarin 1 tablet daily  Recheck in 6 weeks.  

## 2020-04-28 ENCOUNTER — Ambulatory Visit: Payer: Medicare Other | Admitting: Internal Medicine

## 2020-05-05 ENCOUNTER — Ambulatory Visit (INDEPENDENT_AMBULATORY_CARE_PROVIDER_SITE_OTHER): Payer: Medicare Other | Admitting: *Deleted

## 2020-05-05 DIAGNOSIS — Z5181 Encounter for therapeutic drug level monitoring: Secondary | ICD-10-CM

## 2020-05-05 DIAGNOSIS — I82401 Acute embolism and thrombosis of unspecified deep veins of right lower extremity: Secondary | ICD-10-CM | POA: Diagnosis not present

## 2020-05-05 DIAGNOSIS — I4891 Unspecified atrial fibrillation: Secondary | ICD-10-CM

## 2020-05-05 LAB — POCT INR: INR: 2.1 (ref 2.0–3.0)

## 2020-05-05 NOTE — Patient Instructions (Signed)
Continue warfarin 1 tablet daily Recheck in 6 weeks Moving to [place] on Friday

## 2020-06-18 ENCOUNTER — Ambulatory Visit: Payer: Medicare Other | Admitting: Internal Medicine

## 2020-07-12 ENCOUNTER — Other Ambulatory Visit: Payer: Self-pay | Admitting: Cardiology

## 2020-09-13 ENCOUNTER — Ambulatory Visit: Payer: Medicare Other | Admitting: Cardiology

## 2020-09-13 NOTE — Progress Notes (Deleted)
Clinical Summary Renee Pratt is a 75 y.o.female seen today for follow up of the following medical problems.    1.HTN - compliant with meds   2. History of DVT - notes indicate recurrent DVTs in 2011, 2012, 2014.  - has been on anticoagulation - notes mention prior IVC filter placed. Venatech Vena cava filter 01/2011 - previously seen by hematologty, 05/2013 recs indefinite anticoagulation - she reports on eliquis in the past, too expensive.Has been on coumadin.   - DOACs too expensive, restarted coumadin - no bleeding on couadmin  3. Palpitations - a 24 hr holter was ordered by pcp 02/2017 shows SR, rare PACs and PVCs. - improvedsymptomswith beta blocker.  - denies any palpitations.   4. Hyperlipidemia -reports muscle aches on statins.Most recnetly on low dose pravastatin every other day and did not tolerate   - she is on zetia 10mg  daily, just started pcp   5. Chest pain - reported symptoms in 06/2019, seen by PA Barrett - 06/2019 nuclear stress: no ischemia  - she denies any recent symptoms   SH: moved from [place]  Has completed covid vaccine.    Past Medical History:  Diagnosis Date  . Allergy   . 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 Cataract   . Clotting disorder ([place])   . DDD (degenerative disc disease), lumbar   . Depression   . 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 First time 2011, another in 2012, 03/2013  . GERD (gastroesophageal reflux disease)    hiatal hernia  . 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 Hyperlipidemia   . Hypertension   . Uterine fibroid   . Vaginal Pap smear, abnormal      Allergies  Allergen Reactions  . Lisinopril     Cough   . Atorvastatin Other (See Comments)    Myalgias   . Crestor [Rosuvastatin Calcium] Other (See Comments)    Myalgias / right sided weakness   . Tetracyclines & Related     Nausea, pain      Current Outpatient Medications  Medication Sig Dispense Refill  . amLODipine (NORVASC) 5 MG tablet Take 1 tablet by mouth daily.    Marland Kitchen docusate sodium (COLACE) 100 MG capsule Take 100 mg by mouth daily as needed for mild constipation.    . Ergocalciferol (VITAMIN D2) 50 MCG (2000 UT) TABS Take 1 tablet by mouth daily.    Marland Kitchen ezetimibe (ZETIA) 10 MG tablet Take 10 mg by mouth daily.    . hydrochlorothiazide (HYDRODIURIL) 25 MG tablet Take 1 tablet (25 mg total) by mouth daily. 90 tablet 3  . metoprolol tartrate (LOPRESSOR) 25 MG tablet TAKE 1/2 (ONE-HALF) TABLET BY MOUTH TWICE DAILY 90 tablet 0  . spironolactone (ALDACTONE) 25 MG tablet Take 1 tablet by mouth once daily 90 tablet 1  . warfarin (COUMADIN) 5 MG tablet TAKE 1 TABLET BY MOUTH ONCE DAILY OR AS DIRECTED 35 tablet 6   No current facility-administered medications for this visit.     Past Surgical History:  Procedure Laterality Date  . CERVICAL BIOPSY    . COLPOSCOPY    . INSERTION OF VENA CAVA FILTER  01-17-2011   dr pindr # [phone]  [place]     Allergies  Allergen Reactions  . Lisinopril     Cough   . Atorvastatin Other (See Comments)    Myalgias   .  Crestor [Rosuvastatin Calcium] Other (See Comments)    Myalgias / right sided weakness   . Tetracyclines & Related     Nausea, pain      Family History  Problem Relation Age of Onset  . Kidney disease Mother   . Hypertension Mother   . Stroke Mother 69  . AAA (abdominal aortic aneurysm) Mother   . Arthritis Mother   . Cancer Father 68       lung cancer  . Hypertension Father   . COPD Father   . Depression Maternal Grandmother   . Depression Son   . Other Cousin        died fo pulmonary embolus  . Colon cancer Cousin   . Clotting disorder Neg Hx   . Colon polyps Neg Hx      Social History Ms. Kean reports that she quit smoking about 35 years ago. Her smoking use included cigarettes. She has a 5.00 pack-year smoking history. She has never  used smokeless tobacco. Ms. Jager reports current alcohol use.   Review of Systems CONSTITUTIONAL: No weight loss, fever, chills, weakness or fatigue.  HEENT: Eyes: No visual loss, blurred vision, double vision or yellow sclerae.No hearing loss, sneezing, congestion, runny nose or sore throat.  SKIN: No rash or itching.  CARDIOVASCULAR:  RESPIRATORY: No shortness of breath, cough or sputum.  GASTROINTESTINAL: No anorexia, nausea, vomiting or diarrhea. No abdominal pain or blood.  GENITOURINARY: No burning on urination, no polyuria NEUROLOGICAL: No headache, dizziness, syncope, paralysis, ataxia, numbness or tingling in the extremities. No change in bowel or bladder control.  MUSCULOSKELETAL: No muscle, back pain, joint pain or stiffness.  LYMPHATICS: No enlarged nodes. No history of splenectomy.  PSYCHIATRIC: No history of depression or anxiety.  ENDOCRINOLOGIC: No reports of sweating, cold or heat intolerance. No polyuria or polydipsia.  Marland Kitchen   Physical Examination There were no vitals filed for this visit. There were no vitals filed for this visit.  Gen: resting comfortably, no acute distress HEENT: no scleral icterus, pupils equal round and reactive, no palptable cervical adenopathy,  CV Resp: Clear to auscultation bilaterally GI: abdomen is soft, non-tender, non-distended, normal bowel sounds, no hepatosplenomegaly MSK: extremities are warm, no edema.  Skin: warm, no rash Neuro:  no focal deficits Psych: appropriate affect   Diagnostic Studies 08/2018 echo IMPRESSIONS    1. The left ventricle has normal systolic function, with an ejection  fraction of 55-60%. The cavity size was normal. Left ventricular diastolic  Doppler parameters are consistent with impaired relaxation Indeterminent  filling pressures No evidence of  left ventricular regional wall motion abnormalities.  2. The right ventricle has normal systolic function. The cavity was  normal. There is no increase  in right ventricular wall thickness.  3. The mitral valve is normal in structure.  4. The tricuspid valve is normal in structure.  5. The aortic valve is tricuspid.  6. The pulmonic valve was grossly normal. Pulmonic valve regurgitation is  mild by color flow Doppler.  7. No evidence of left ventricular regional wall motion abnormalities.  8. Right atrial pressure is estimated at 10 mmHg.  06/2019 nuclear stress  There was no ST segment deviation noted during stress.  The study is normal. There are no perfusion defects consistent with prior infarct or current ischemia.  This is a low risk study.  The left ventricular ejection fraction is hyperdynamic (>65%).     Assessment and Plan  1.HTN - she is at goal, continue current meds  2. Recurrent DVT - lifelong anticoagulation -DOACs too expensive,she prefers coumadin - continue coumadin   3. Palpitations - prior holter with benign ectopy - no recent issues, continue to monitor   4. Hyperlipidemia - intolerant to statins, very high LDL that will not reach goal with zetia alone - refer to lipid clinic to consider pcsk 9 inhibitor.       Arnoldo Lenis, M.D.

## 2020-10-09 ENCOUNTER — Other Ambulatory Visit: Payer: Self-pay | Admitting: Cardiology

## 2020-11-09 ENCOUNTER — Telehealth: Payer: Self-pay | Admitting: *Deleted

## 2020-11-09 NOTE — Telephone Encounter (Signed)
Called pt to remind her that it was time for TCS. There is record in Epic that she was going to be having it elsewhere.  Pt informed me that she had it recently in [place].  She said everything turned out good.

## 2020-11-16 ENCOUNTER — Other Ambulatory Visit: Payer: Self-pay | Admitting: *Deleted

## 2020-11-16 ENCOUNTER — Other Ambulatory Visit: Payer: Self-pay | Admitting: Cardiology

## 2020-11-16 DIAGNOSIS — I82401 Acute embolism and thrombosis of unspecified deep veins of right lower extremity: Secondary | ICD-10-CM

## 2020-11-16 DIAGNOSIS — Z5181 Encounter for therapeutic drug level monitoring: Secondary | ICD-10-CM

## 2020-11-16 MED ORDER — WARFARIN SODIUM 5 MG PO TABS: ORAL_TABLET | ORAL | 0 refills | Status: AC

## 2020-11-16 MED ORDER — WARFARIN SODIUM 5 MG PO TABS: ORAL_TABLET | ORAL | 0 refills | Status: DC

## 2020-11-16 NOTE — Addendum Note (Signed)
Addended by: Malen Gauze on: 11/16/2020 10:19 AM   Modules accepted: Orders

## 2020-11-16 NOTE — Addendum Note (Signed)
Addended by: Malen Gauze on: 11/16/2020 10:16 AM   Modules accepted: Orders
